# Patient Record
Sex: Female | Born: 1955 | Race: Black or African American | Hispanic: No | Smoking: Former smoker
Health system: Southern US, Community
[De-identification: ages and names within clinical notes are randomized; demographics above are authoritative.]

## PROBLEM LIST (undated history)

## (undated) DIAGNOSIS — F329 Major depressive disorder, single episode, unspecified: Secondary | ICD-10-CM

## (undated) DIAGNOSIS — E785 Hyperlipidemia, unspecified: Secondary | ICD-10-CM

## (undated) DIAGNOSIS — I10 Essential (primary) hypertension: Secondary | ICD-10-CM

## (undated) DIAGNOSIS — M1990 Unspecified osteoarthritis, unspecified site: Secondary | ICD-10-CM

## (undated) DIAGNOSIS — S83249A Other tear of medial meniscus, current injury, unspecified knee, initial encounter: Secondary | ICD-10-CM

## (undated) DIAGNOSIS — F32A Depression, unspecified: Secondary | ICD-10-CM

## (undated) HISTORY — DX: Hyperlipidemia, unspecified: E78.5

## (undated) HISTORY — PX: ABDOMINAL HYSTERECTOMY: SHX81

## (undated) HISTORY — PX: BREAST BIOPSY: SHX20

---

## 2012-05-03 HISTORY — PX: BREAST BIOPSY: SHX20

## 2015-12-18 ENCOUNTER — Encounter: Payer: Self-pay | Admitting: Emergency Medicine

## 2015-12-18 ENCOUNTER — Emergency Department
Admission: EM | Admit: 2015-12-18 | Discharge: 2015-12-18 | Disposition: A | Discharge status: Home / Self Care | Payer: Self-pay | Attending: Emergency Medicine | Admitting: Emergency Medicine

## 2015-12-18 DIAGNOSIS — I10 Essential (primary) hypertension: Secondary | ICD-10-CM | POA: Insufficient documentation

## 2015-12-18 DIAGNOSIS — F17200 Nicotine dependence, unspecified, uncomplicated: Secondary | ICD-10-CM | POA: Insufficient documentation

## 2015-12-18 DIAGNOSIS — N390 Urinary tract infection, site not specified: Secondary | ICD-10-CM | POA: Insufficient documentation

## 2015-12-18 HISTORY — DX: Essential (primary) hypertension: I10

## 2015-12-18 HISTORY — DX: Depression, unspecified: F32.A

## 2015-12-18 HISTORY — DX: Major depressive disorder, single episode, unspecified: F32.9

## 2015-12-18 LAB — URINALYSIS COMPLETE WITH MICROSCOPIC (ARMC ONLY)
Bilirubin Urine: NEGATIVE
Glucose, UA: NEGATIVE mg/dL
Ketones, ur: NEGATIVE mg/dL
Nitrite: NEGATIVE
Protein, ur: NEGATIVE mg/dL
Specific Gravity, Urine: 1.012 (ref 1.005–1.030)
pH: 5 (ref 5.0–8.0)

## 2015-12-18 MED ORDER — PHENAZOPYRIDINE HCL 100 MG PO TABS: 100.0000 mg | ORAL_TABLET | Freq: Three times a day (TID) | ORAL | 0 refills | Status: DC | PRN

## 2015-12-18 MED ORDER — CEPHALEXIN 500 MG PO CAPS: 500.0000 mg | ORAL_CAPSULE | Freq: Three times a day (TID) | ORAL | 0 refills | Status: DC

## 2015-12-18 NOTE — ED Triage Notes (Signed)
Patient presents to the ED with dysuria and hematuria.  Patient reports bladder prolapse and history of frequent UTIs and reports hematuria today.  Patient is in no obvious distress at this time.  Smiling and laughing during triage.  Ambulatory to triage without obvious difficulty.

## 2015-12-18 NOTE — ED Provider Notes (Signed)
[facility] Emergency Department Provider Note  Time seen: 7:52 AM  I have reviewed the triage vital signs and the nursing notes.   HISTORY  Chief Complaint Hematuria    HPI Elaine Robinson is a 60 y.o. female with a past medical history of depression, hypertension, who presents the emergency department with dysuria and hematuria. According to the patient she has a history of bladder prolapse and frequent UTIs. She states since yesterday she has been noticing dysuria and then a dark color to her urine which she thinks could be blood in it this morning. Patient denies fever, nausea, vomiting, diarrhea. Denies abdominal pain but states she can feel a pressure sensation in her bladder. Describes the dysuria as moderate.  Past Medical History:  Diagnosis Date  . Depression   . Hypertension     There are no active problems to display for this patient.   Past Surgical History:  Procedure Laterality Date  . ABDOMINAL HYSTERECTOMY      Prior to Admission medications   Not on File    Allergies  Allergen Reactions  . Lisinopril     No family history on file.  Social History Social History  Substance Use Topics  . Smoking status: Current Some Day Smoker  . Smokeless tobacco: Never Used  . Alcohol use Yes    Review of Systems Constitutional: Negative for fever Cardiovascular: Negative for chest pain. Respiratory: Negative for shortness of breath. Gastrointestinal: Lower abdominal pressure. Negative for nausea, vomiting, diarrhea Genitourinary: Positive for dysuria and Hematuria. Musculoskeletal: Negative for back pain. Neurological: Negative for headache 10-point ROS otherwise negative.  ____________________________________________   PHYSICAL EXAM:  VITAL SIGNS: ED Triage Vitals  Enc Vitals Group     BP 12/18/15 0729 135/78     Pulse Rate 12/18/15 0729 88     Resp 12/18/15 0729 18     Temp 12/18/15 0729 98 F (36.7 C)     Temp Source  12/18/15 0729 Oral     SpO2 12/18/15 0729 99 %     Weight 12/18/15 0730 191 lb (86.6 kg)     Height 12/18/15 0730 5\' 6"  (1.676 m)     Head Circumference --      Peak Flow --      Pain Score 12/18/15 0729 5     Pain Loc --      Pain Edu? --      Excl. in [place]? --     Constitutional: Alert and oriented. Well appearing and in no distress. Eyes: Normal exam ENT   Head: Normocephalic and atraumatic   Mouth/Throat: Mucous membranes are moist. Cardiovascular: Normal rate, regular rhythm. No murmur Respiratory: Normal respiratory effort without tachypnea nor retractions. Breath sounds are clear Gastrointestinal: Soft and nontender. No distention.  There is no CVA tenderness Musculoskeletal: Nontender with normal range of motion in all extremities.  Neurologic:  Normal speech and language. No gross focal neurologic deficits Psychiatric: Mood and affect are normal. Speech and behavior are normal.   ____________________________________________    INITIAL IMPRESSION / ASSESSMENT AND PLAN / ED COURSE  Pertinent labs & imaging results that were available during my care of the patient were reviewed by me and considered in my medical decision making (see chart for details).  The patient presents the emergency department with dysuria, lower abdominal pressure. Patient states a history of frequent urinary tract infections, she does not have a urologist in this area as she recently moved here 1 month ago. We will check  a urinalysis as well as a urine culture. Overall the patient appears well, no distress with a nontender abdominal exam without CVA tenderness.  The patient has 6-30 RBCs and WBCs given her dysuria with urinalysis findings we will treat with Keflex for presumed urinary tract infection. A urine culture has been sent. We will refer to urology for the patient's intermittent bladder prolapse she has been experiencing, as she recently moved to the area and does not have a local  urologist.  ____________________________________________   FINAL CLINICAL IMPRESSION(S) / ED DIAGNOSES  Urinary tract infection    Harvest Dark, MD 12/18/15 [phone]

## 2015-12-18 NOTE — Discharge Instructions (Signed)
Please call the number provided for urology to arrange the next available appointment, to further discuss treatment options for bladder prolapse in your frequent urinary tract infections. Return to the emergency department for any worsening discomfort, fever, or any other symptom personally concerning to your self.

## 2015-12-20 LAB — URINE CULTURE: Culture: >=100000 — AB

## 2016-01-09 ENCOUNTER — Ambulatory Visit (INDEPENDENT_AMBULATORY_CARE_PROVIDER_SITE_OTHER): Payer: Self-pay | Admitting: Urology

## 2016-01-09 VITALS — BP 133/83 | HR 92 | Ht 66.0 in | Wt 195.0 lb

## 2016-01-09 DIAGNOSIS — R300 Dysuria: Secondary | ICD-10-CM

## 2016-01-09 DIAGNOSIS — R3129 Other microscopic hematuria: Secondary | ICD-10-CM

## 2016-01-09 DIAGNOSIS — N3946 Mixed incontinence: Secondary | ICD-10-CM

## 2016-01-09 LAB — MICROSCOPIC EXAMINATION
Bacteria, UA: NONE SEEN
Epithelial Cells (non renal): >10 /hpf — AB (ref 0–10)

## 2016-01-09 LAB — URINALYSIS, COMPLETE
Bilirubin, UA: NEGATIVE
Glucose, UA: NEGATIVE
Ketones, UA: NEGATIVE
Leukocytes, UA: NEGATIVE
Nitrite, UA: NEGATIVE
Protein, UA: NEGATIVE
RBC, UA: NEGATIVE
Specific Gravity, UA: 1.025 (ref 1.005–1.030)
Urobilinogen, Ur: >8 mg/dL — ABNORMAL HIGH (ref 0.2–1.0)
pH, UA: 6.5 (ref 5.0–7.5)

## 2016-01-09 LAB — BLADDER SCAN AMB NON-IMAGING: Scan Result: 32

## 2016-01-09 NOTE — Progress Notes (Signed)
01/09/2016 9:05 AM   Elaine Robinson 24-Nov-1955 [id number]  Referring provider: No referring provider defined for this encounter.  Chief Complaint  Patient presents with  . New Patient (Initial Visit)    f/u ER dysuria, UTI, and possible prolapse    HPI   The patient was recently treated for urinary tract infection in the emergency room. She reports typical cystitis symptoms that usually clear with antibiotics. She was given Keflex. She did have a positive culture. She believes she has infections almost monthly  She was told she has prolapse. She does not have vaginal bulging sensation. When she leans forward she feels a little bit of pulling.  At baseline she gets up once or twice a night to void. She voids every 3 hours or longer. She wears 2 pads a day that are damp. She does have mild urge incontinence. She leaks with coughing and sneezing but not bending and lifting.  She has loose bowel movements and has had a hysterectomy. She has no neurologic issues  The medical records were reviewed and she did have a positive urine culture  Modifying factors: There are no other modifying factors  Associated signs and symptoms: There are no other associated signs and symptoms Aggravating and relieving factors: There are no other aggravating or relieving factors Severity: Moderate Duration: Persistent  PMH: Past Medical History:  Diagnosis Date  . Depression   . Hypertension     Surgical History: Past Surgical History:  Procedure Laterality Date  . ABDOMINAL HYSTERECTOMY      Home Medications:    Medication List    as of 01/09/2016  9:05 AM   You have not been prescribed any medications.     Allergies:  Allergies  Allergen Reactions  . Lisinopril     Family History: No family history on file.  Social History:  reports that she has been smoking.  She has never used smokeless tobacco. She reports that she drinks alcohol. Her drug history is not on file.  ROS:                                        Physical Exam: BP 133/83   Pulse 92   Ht 5\' 6"  (1.676 m)   Wt 195 lb (88.5 kg)   BMI 31.47 kg/m   Constitutional:  Alert and oriented, No acute distress. HEENT: [place] AT, moist mucus membranes.  Trachea midline, no masses. Cardiovascular: No clubbing, cyanosis, or edema. Respiratory: Normal respiratory effort, no increased work of breathing. GI: Abdomen is soft, nontender, nondistended, no abdominal masses GU: No CVA tenderness. High grade 1 cystocele and no rectocele or stress incontinence here at her bladder neck demonstrated a grade 1 type her mobility Skin: No rashes, bruises or suspicious lesions. Lymph: No cervical or inguinal adenopathy. Neurologic: Grossly intact, no focal deficits, moving all 4 extremities. Psychiatric: Normal mood and affect.  Laboratory Data: No results found for: WBC, HGB, HCT, MCV, PLT  No results found for: CREATININE  No results found for: PSA  No results found for: TESTOSTERONE  No results found for: HGBA1C  Urinalysis    Component Value Date/Time   COLORURINE YELLOW (A) 12/18/2015 0743   APPEARANCEUR HAZY (A) 12/18/2015 0743   LABSPEC 1.012 12/18/2015 0743   PHURINE 5.0 12/18/2015 0743   GLUCOSEU NEGATIVE 12/18/2015 0743   HGBUR 2+ (A) 12/18/2015 [place] 12/18/2015 [id number]  KETONESUR NEGATIVE 12/18/2015 0743   PROTEINUR NEGATIVE 12/18/2015 0743   NITRITE NEGATIVE 12/18/2015 0743   LEUKOCYTESUR TRACE (A) 12/18/2015 0743    Pertinent Imaging: None  Assessment & Plan:  The patient provides a history of chronic cystitis. She was told she has vaginal prolapse. She does not report vaginal bulging sensation. She has mild nocturia and mild mixed incontinence.  The patient does not have clinically significant prolapse. I recommended a renal ultrasound to evaluate her chronic cystitis. If one can control her chronic cystitis with urinary prophylaxis it may down  regulate her mild incontinence. The treatment of her incontinence will depend upon her treatment goals.  1. Dysuria 2. Chronic cystitis  3. Mixed incontinence   - Urinalysis, Complete - CULTURE, URINE COMPREHENSIVE    Reece Packer, MD  [facility] [address], [place],  [address] [phone]

## 2016-01-13 LAB — CULTURE, URINE COMPREHENSIVE

## 2016-01-16 ENCOUNTER — Ambulatory Visit
Admission: RE | Admit: 2016-01-16 | Discharge: 2016-01-16 | Disposition: A | Discharge status: Home / Self Care | Payer: Self-pay | Source: Ambulatory Visit | Attending: Urology | Admitting: Urology

## 2016-01-16 DIAGNOSIS — N3946 Mixed incontinence: Secondary | ICD-10-CM | POA: Insufficient documentation

## 2016-01-16 DIAGNOSIS — R3129 Other microscopic hematuria: Secondary | ICD-10-CM | POA: Insufficient documentation

## 2016-01-16 DIAGNOSIS — R300 Dysuria: Secondary | ICD-10-CM | POA: Insufficient documentation

## 2016-01-19 ENCOUNTER — Telehealth: Payer: Self-pay

## 2016-01-19 NOTE — Telephone Encounter (Signed)
Pt called requesting RUS results. Made pt aware RUS results were negative. Pt had several in depth questions in reference to the "pulling sensation" of groin. Reinforced with pt to keep f/u appt with Dr. Matilde Sprang as he will be able to answer the questions. Pt voiced understanding.

## 2016-02-24 ENCOUNTER — Encounter (INDEPENDENT_AMBULATORY_CARE_PROVIDER_SITE_OTHER): Payer: Self-pay

## 2016-02-24 ENCOUNTER — Ambulatory Visit: Payer: Self-pay | Admitting: Pharmacy Technician

## 2016-02-24 DIAGNOSIS — Z79899 Other long term (current) drug therapy: Secondary | ICD-10-CM

## 2016-02-24 NOTE — Progress Notes (Signed)
  Completed [facility] application and contract.  Patient agreed to all terms of the [facility] contract.  Patient to provide utility bill, last 30 days of pay stubs, and last 30 days of checking account statements.  Provided patient with community resource material based on her particular needs.    [facility]

## 2016-03-08 ENCOUNTER — Ambulatory Visit (INDEPENDENT_AMBULATORY_CARE_PROVIDER_SITE_OTHER): Payer: Self-pay | Admitting: Urology

## 2016-03-08 VITALS — BP 136/83 | HR 98 | Ht 66.0 in | Wt 202.0 lb

## 2016-03-08 DIAGNOSIS — N393 Stress incontinence (female) (male): Secondary | ICD-10-CM

## 2016-03-08 MED ORDER — TRIMETHOPRIM 100 MG PO TABS: 100.0000 mg | ORAL_TABLET | Freq: Every day | ORAL | 22 refills | Status: DC

## 2016-03-08 NOTE — Progress Notes (Signed)
03/08/2016 8:48 AM   Elaine Robinson August 13, 1955 [id number]  Referring provider: No referring provider defined for this encounter.  Chief Complaint  Patient presents with  . Results    HPI: The patient was recently treated for urinary tract infection in the emergency room. She reports typical cystitis symptoms that usually clear with antibiotics. She was given Keflex. She did have a positive culture. She believes she has infections almost monthly  She was told she has prolapse. She does not have vaginal bulging sensation. When she leans forward she feels a little bit of pulling.  At baseline she gets up once or twice a night to void. She voids every 3 hours or longer. She wears 2 pads a day that are damp. She does have mild urge incontinence. She leaks with coughing and sneezing but not bending and lifting.   High grade 1 cystocele and no rectocele or stress incontinence here at her bladder neck demonstrated a grade 1 type her mobility  The patient does not have clinically significant prolapse. I recommended a renal ultrasound to evaluate her chronic cystitis. If one can control her chronic cystitis with urinary prophylaxis it may down regulate her mild incontinence. The treatment of her incontinence will depend upon her treatment goals.  Today Frequency is stable Ultrasound is normal Pulling sensation in the vagina is stable Clinically she was not infected today  PMH: Past Medical History:  Diagnosis Date  . Depression   . Hypertension     Surgical History: Past Surgical History:  Procedure Laterality Date  . ABDOMINAL HYSTERECTOMY      Home Medications:    Medication List       Accurate as of 03/08/16  8:48 AM. Always use your most recent med list.          amLODipine-atorvastatin 5-20 MG tablet Commonly known as:  CADUET Take 1 tablet by mouth daily.   aspirin 81 MG chewable tablet Chew by mouth daily.   omeprazole 20 MG capsule Commonly known as:   PRILOSEC Take 20 mg by mouth daily.   sertraline 100 MG tablet Commonly known as:  ZOLOFT Take 100 mg by mouth daily.       Allergies:  Allergies  Allergen Reactions  . Lisinopril     Family History: No family history on file.  Social History:  reports that she has been smoking.  She has never used smokeless tobacco. She reports that she drinks alcohol. Her drug history is not on file.  ROS: UROLOGY Frequent Urination?: No Hard to postpone urination?: No Burning/pain with urination?: No Get up at night to urinate?: Yes Leakage of urine?: Yes Urine stream starts and stops?: No Trouble starting stream?: No Do you have to strain to urinate?: No Blood in urine?: Yes Urinary tract infection?: Yes Sexually transmitted disease?: No Injury to kidneys or bladder?: No Painful intercourse?: No Weak stream?: No Currently pregnant?: No Vaginal bleeding?: Yes Last menstrual period?: n  Gastrointestinal Nausea?: No Vomiting?: No Indigestion/heartburn?: Yes Diarrhea?: No Constipation?: Yes  Constitutional Fever: No Night sweats?: Yes Weight loss?: No Fatigue?: No  Skin Skin rash/lesions?: No Itching?: No  Eyes Blurred vision?: Yes Double vision?: No  Ears/Nose/Throat Sore throat?: Yes Sinus problems?: No  Hematologic/Lymphatic Swollen glands?: No Easy bruising?: No  Cardiovascular Leg swelling?: No Chest pain?: Yes  Respiratory Cough?: Yes Shortness of breath?: No  Endocrine Excessive thirst?: No  Musculoskeletal Back pain?: No Joint pain?: Yes  Neurological Headaches?: No Dizziness?: No  Psychologic Depression?: Yes Anxiety?:  Yes  Physical Exam: BP 136/83   Pulse 98   Ht 5\' 6"  (1.676 m)   Wt 202 lb (91.6 kg)   BMI 32.60 kg/m     Laboratory Data:  Urinalysis    Component Value Date/Time   COLORURINE YELLOW (A) 12/18/2015 0743   APPEARANCEUR Cloudy (A) 01/09/2016 0838   LABSPEC 1.012 12/18/2015 0743   PHURINE 5.0 12/18/2015  0743   GLUCOSEU Negative 01/09/2016 0838   HGBUR 2+ (A) 12/18/2015 0743   BILIRUBINUR Negative 01/09/2016 [place] 12/18/2015 0743   PROTEINUR Negative 01/09/2016 0838   PROTEINUR NEGATIVE 12/18/2015 0743   NITRITE Negative 01/09/2016 0838   NITRITE NEGATIVE 12/18/2015 0743   LEUKOCYTESUR Negative 01/09/2016 0838    Pertinent Imaging: Ultrasound normal  Assessment & Plan:  The patient was started on trimethoprim and 100 mg with 30 tablets and 11 refills sent. I will reassess in 8 weeks including her voiding dysfunction and mild pulling sensation  There are no diagnoses linked to this encounter.  No Follow-up on file.  Reece Packer, MD red  [facility] [address], [place], [place] [address] [phone]

## 2016-03-09 MED ORDER — TRIMETHOPRIM 100 MG PO TABS: 100.0000 mg | ORAL_TABLET | Freq: Every day | ORAL | 22 refills | Status: DC

## 2016-03-09 NOTE — Addendum Note (Signed)
Addended by: Wilson Singer on: 03/09/2016 10:52 AM   Modules accepted: Orders

## 2016-03-09 NOTE — Addendum Note (Signed)
Addended by: Wilson Singer on: 03/09/2016 10:24 AM   Modules accepted: Orders

## 2016-03-10 ENCOUNTER — Telehealth: Payer: Self-pay | Admitting: Urology

## 2016-03-10 ENCOUNTER — Other Ambulatory Visit: Payer: Self-pay

## 2016-03-10 DIAGNOSIS — N393 Stress incontinence (female) (male): Secondary | ICD-10-CM

## 2016-03-10 MED ORDER — TRIMETHOPRIM 100 MG PO TABS: 100.0000 mg | ORAL_TABLET | Freq: Every day | ORAL | 12 refills | Status: DC

## 2016-03-10 NOTE — Telephone Encounter (Signed)
Pt called stating that she was seen in clinic on Monday and the Dr prescribed her antibiotics.  However the Rx was sent to the wrong pharmacy.  Pt states she called yesterday and spoke to someone giving them the phone number to the correct [place] [facility] [phone]. Please advise.

## 2016-03-10 NOTE — Telephone Encounter (Signed)
Medication resent

## 2016-03-23 ENCOUNTER — Ambulatory Visit: Payer: Self-pay

## 2016-05-10 ENCOUNTER — Ambulatory Visit: Payer: Self-pay

## 2016-05-19 ENCOUNTER — Ambulatory Visit (INDEPENDENT_AMBULATORY_CARE_PROVIDER_SITE_OTHER): Payer: Self-pay | Admitting: Urology

## 2016-05-19 VITALS — BP 133/80 | HR 90 | Ht 66.0 in | Wt 205.7 lb

## 2016-05-19 DIAGNOSIS — N3020 Other chronic cystitis without hematuria: Secondary | ICD-10-CM

## 2016-05-19 NOTE — Progress Notes (Signed)
05/19/2016 8:50 AM   Elaine Robinson [birth year] [id number]  Referring provider: No referring provider defined for this encounter.  Chief Complaint  Patient presents with  . Follow-up    stress incontinence, knot on the labia x 3wks     HPI:  The patient was recently treated for urinary tract infection in the emergency room. She reports typical cystitis symptoms that usually clear with antibiotics. She was given Keflex. She did have a positive culture. She believes she has infections almost monthly  She was told she has prolapse. She does not have vaginal bulging sensation. When she leans forward she feels a little bit of pulling.  At baseline she gets up once or twice a night to void. She voids every 3 hours or longer. She wears 2 pads a day that are damp. She does have mild urge incontinence. She leaks with coughing and sneezing but not bending and lifting.   High grade 1 cystocele and no rectocele or stress incontinence here at her bladder neck demonstrated a grade 1 type her mobility  The patient does not have clinically significant prolapse. I recommended a renal ultrasound to evaluate her chronic cystitis. If one can control her chronic cystitis with urinary prophylaxis it may down regulate her mild incontinence. The treatment of her incontinence will depend upon her treatment goals.  Today I'll incontinence possibly minimally better. Frequency is stable. Clinically no infections.  At her request I examined her and she had in the suprapubic area a small inflamed hair follicle and I recommend a watchful waiting  PMH: Past Medical History:  Diagnosis Date  . Depression   . Hypertension     Surgical History: Past Surgical History:  Procedure Laterality Date  . ABDOMINAL HYSTERECTOMY      Home Medications:  Allergies as of 05/19/2016      Reactions   Lisinopril       Medication List       Accurate as of 05/19/16  8:50 AM. Always use your most recent med list.            amLODipine-atorvastatin 5-20 MG tablet Commonly known as:  CADUET Take 1 tablet by mouth daily.   aspirin 81 MG chewable tablet Chew by mouth daily.   omeprazole 20 MG capsule Commonly known as:  PRILOSEC Take 20 mg by mouth daily.   sertraline 100 MG tablet Commonly known as:  ZOLOFT Take 100 mg by mouth daily.   trimethoprim 100 MG tablet Commonly known as:  TRIMPEX Take 1 tablet (100 mg total) by mouth daily.       Allergies:  Allergies  Allergen Reactions  . Lisinopril     Family History: No family history on file.  Social History:  reports that she has been smoking.  She has never used smokeless tobacco. She reports that she drinks alcohol. Her drug history is not on file.  ROS: UROLOGY Frequent Urination?: No Hard to postpone urination?: No Burning/pain with urination?: No Get up at night to urinate?: Yes Leakage of urine?: Yes Urine stream starts and stops?: No Trouble starting stream?: No Do you have to strain to urinate?: No Blood in urine?: No Urinary tract infection?: No Sexually transmitted disease?: No Injury to kidneys or bladder?: No Painful intercourse?: No Weak stream?: No Currently pregnant?: No Vaginal bleeding?: No Last menstrual period?: n  Gastrointestinal Nausea?: No Vomiting?: No Indigestion/heartburn?: Yes Diarrhea?: No Constipation?: No  Constitutional Fever: No Night sweats?: No Weight loss?: No Fatigue?: No  Skin Skin  rash/lesions?: No Itching?: No  Eyes Blurred vision?: Yes Double vision?: No  Ears/Nose/Throat Sore throat?: No Sinus problems?: No  Hematologic/Lymphatic Swollen glands?: No Easy bruising?: No  Cardiovascular Leg swelling?: No Chest pain?: No  Respiratory Cough?: Yes Shortness of breath?: No  Endocrine Excessive thirst?: No  Musculoskeletal Back pain?: Yes Joint pain?: No  Neurological Headaches?: Yes Dizziness?: No  Psychologic Depression?: Yes Anxiety?:  Yes  Physical Exam: BP 133/80   Pulse 90   Ht 5\' 6"  (1.676 m)   Wt 205 lb 11.2 oz (93.3 kg)   BMI 33.20 kg/m     Laboratory Data: No results found for: WBC, HGB, HCT, MCV, PLT  No results found for: CREATININE  No results found for: PSA  No results found for: TESTOSTERONE  No results found for: HGBA1C  Urinalysis    Component Value Date/Time   COLORURINE YELLOW (A) 12/18/2015 0743   APPEARANCEUR Cloudy (A) 01/09/2016 0838   LABSPEC 1.012 12/18/2015 0743   PHURINE 5.0 12/18/2015 0743   GLUCOSEU Negative 01/09/2016 0838   HGBUR 2+ (A) 12/18/2015 0743   BILIRUBINUR Negative 01/09/2016 0838   KETONESUR NEGATIVE 12/18/2015 0743   PROTEINUR Negative 01/09/2016 0838   PROTEINUR NEGATIVE 12/18/2015 0743   NITRITE Negative 01/09/2016 0838   NITRITE NEGATIVE 12/18/2015 0743   LEUKOCYTESUR Negative 01/09/2016 0838    Pertinent Imaging: Renal us/ound was normal  Assessment & Plan:  The patient has chronic cystitis with mild incontinence. Renal ultrasound was normal. I will reassess in 6 months on daily trimethoprim  There are no diagnoses linked to this encounter.  Return in about 6 months (around 11/16/2016).  Reece Packer, MD  [facility] [address] [place], [place] [address] [phone]

## 2016-11-22 ENCOUNTER — Ambulatory Visit: Payer: Self-pay | Admitting: Urology

## 2016-11-22 ENCOUNTER — Encounter: Payer: Self-pay | Admitting: Urology

## 2017-03-10 ENCOUNTER — Telehealth: Payer: Self-pay | Admitting: Pharmacy Technician

## 2017-03-10 NOTE — Telephone Encounter (Signed)
Patient failed to provide current poi for 2018.  No additional medication assistance will be provided by [facility] without the required proof of income documentation.  Patient notified by letter.  [place] [facility] [facility]

## 2018-12-08 ENCOUNTER — Emergency Department
Admission: EM | Admit: 2018-12-08 | Discharge: 2018-12-08 | Disposition: A | Discharge status: Home / Self Care | Payer: Self-pay | Attending: Emergency Medicine | Admitting: Emergency Medicine

## 2018-12-08 ENCOUNTER — Other Ambulatory Visit: Payer: Self-pay

## 2018-12-08 DIAGNOSIS — F17210 Nicotine dependence, cigarettes, uncomplicated: Secondary | ICD-10-CM | POA: Insufficient documentation

## 2018-12-08 DIAGNOSIS — Z79899 Other long term (current) drug therapy: Secondary | ICD-10-CM | POA: Insufficient documentation

## 2018-12-08 DIAGNOSIS — Z7982 Long term (current) use of aspirin: Secondary | ICD-10-CM | POA: Insufficient documentation

## 2018-12-08 DIAGNOSIS — Z760 Encounter for issue of repeat prescription: Secondary | ICD-10-CM | POA: Insufficient documentation

## 2018-12-08 DIAGNOSIS — I10 Essential (primary) hypertension: Secondary | ICD-10-CM | POA: Insufficient documentation

## 2018-12-08 MED ORDER — AMLODIPINE-ATORVASTATIN 5-20 MG PO TABS: 1.0000 | ORAL_TABLET | Freq: Every day | ORAL | 1 refills | Status: DC

## 2018-12-08 MED ORDER — SIMVASTATIN 10 MG PO TABS: 10.0000 mg | ORAL_TABLET | Freq: Every evening | ORAL | 1 refills | Status: DC

## 2018-12-08 NOTE — Discharge Instructions (Signed)
Call all of the clinics listed on your discharge papers to see if they are taking new patients.  Also the open-door clinic is available to you.  Information was given to you about [facility] if needed for anxiety and/or depression.

## 2018-12-08 NOTE — ED Provider Notes (Signed)
[facility] Emergency Department Provider Note  ____________________________________________   First MD Initiated Contact with Patient 12/08/18 1033     (approximate)  I have reviewed the triage vital signs and the nursing notes.   HISTORY  Chief Complaint Medication Refill   HPI Elaine Robinson is a 63 y.o. female presents to the ED for refill of her medications.  Currently she is taking simvastatin and amlodipine.  She states that she moved here in January and has been unable to establish a PCP.  She also wanted to speak with someone about starting back her Zoloft which she stopped several years ago because she felt she did not need it.  She denies any suicidal ideation.      Past Medical History:  Diagnosis Date  . Depression   . Hypertension     There are no active problems to display for this patient.   Past Surgical History:  Procedure Laterality Date  . ABDOMINAL HYSTERECTOMY      Prior to Admission medications   Medication Sig Start Date End Date Taking? Authorizing Provider  amLODipine-atorvastatin (CADUET) 5-20 MG tablet Take 1 tablet by mouth daily. 12/08/18   Johnn Hai, PA-C  aspirin 81 MG chewable tablet Chew by mouth daily.    [provider]  omeprazole (PRILOSEC) 20 MG capsule Take 20 mg by mouth daily.    [provider]  sertraline (ZOLOFT) 100 MG tablet Take 100 mg by mouth daily.    [provider]  simvastatin (ZOCOR) 10 MG tablet Take 1 tablet (10 mg total) by mouth every evening. 12/08/18 12/08/19  Johnn Hai, PA-C  trimethoprim (TRIMPEX) 100 MG tablet Take 1 tablet (100 mg total) by mouth daily. 03/10/16   Hollice Espy, MD    Allergies Lisinopril  No family history on file.  Social History Social History   Tobacco Use  . Smoking status: Current Some Day Smoker  . Smokeless tobacco: Never Used  Substance Use Topics  . Alcohol use: Yes  . Drug use: Not on file     Review of Systems Constitutional: No fever/chills Cardiovascular: Denies chest pain. Respiratory: Denies shortness of breath. Gastrointestinal: No abdominal pain.  No nausea, no vomiting.  Genitourinary: Negative for dysuria. Musculoskeletal: Negative for back pain. Skin: Negative for rash. Neurological: Negative for headaches, focal weakness or numbness. ____________________________________________   PHYSICAL EXAM:  VITAL SIGNS: ED Triage Vitals [12/08/18 1020]  Enc Vitals Group     BP 115/61     Pulse Rate 79     Resp 18     Temp 98.1 F (36.7 C)     Temp Source Oral     SpO2 100 %     Weight 200 lb (90.7 kg)     Height 5\' 6"  (1.676 m)     Head Circumference      Peak Flow      Pain Score 0     Pain Loc      Pain Edu?      Excl. in [place]?    Constitutional: Alert and oriented. Well appearing and in no acute distress.  Patient is also here with her boyfriend. Eyes: Conjunctivae are normal.  Head: Atraumatic. Neck: No stridor.   Cardiovascular: Normal rate, regular rhythm. Grossly normal heart sounds.  Good peripheral circulation. Respiratory: Normal respiratory effort.  No retractions. Lungs CTAB. Gastrointestinal: Soft and nontender. No distention.  Musculoskeletal: Moves upper and lower extremities without any difficulty normal gait was noted. Neurologic:  Normal speech and language. No gross focal neurologic deficits are appreciated. No gait instability. Skin:  Skin is warm, dry and intact. No rash noted. Psychiatric: Mood and affect are normal. Speech and behavior are normal.  ____________________________________________   LABS (all labs ordered are listed, but only abnormal results are displayed)  Labs Reviewed - No data to display   PROCEDURES  Procedure(s) performed (including Critical Care):  Procedures   ____________________________________________   INITIAL IMPRESSION / ASSESSMENT AND PLAN / ED COURSE  As part of my medical decision making, I  reviewed the following data within the electronic MEDICAL RECORD NUMBER Notes from prior ED visits and [place] Controlled Substance Database  63 year old female presents to the ED for a refill of medications.  Patient states that she takes amlodipine and simvastatin but has been out of them for approximately 5 or 6 months.  She also inquired about Zoloft but has not taken it in several years.  She was given information about [facility] that she can follow-up with and be evaluated.  She denies any suicidal ideation and states that she discontinued taking it several years ago because she felt like she did not need it.  Her routine medication was refilled and patient was given a list of clinics that charge on a sliding scale along with the [facility].  ____________________________________________   FINAL CLINICAL IMPRESSION(S) / ED DIAGNOSES  Final diagnoses:  Encounter for medication refill     ED Discharge Orders         Ordered    amLODipine-atorvastatin (CADUET) 5-20 MG tablet  Daily     12/08/18 1148    simvastatin (ZOCOR) 10 MG tablet  Every evening     12/08/18 1148           Note:  This document was prepared using Dragon voice recognition software and may include unintentional dictation errors.    Johnn Hai, PA-C 12/08/18 1210    Earleen Newport, MD 12/08/18 [phone]

## 2018-12-08 NOTE — ED Notes (Signed)
See triage note  Presents requesting medication refill states she has not been able to find a PCP  Requesting clinic names on discharge

## 2018-12-08 NOTE — ED Triage Notes (Addendum)
Requesting medication refill. Pt reports recent move to area in January and has been unable to establish PCP. Pt alert and oriented X4, active, cooperative, pt in NAD. RR even and unlabored, color WNL.  Amlodipine, simvastatin and possibly sertraline but pt has stopped taking at this time.

## 2018-12-13 ENCOUNTER — Other Ambulatory Visit: Payer: Self-pay

## 2018-12-13 ENCOUNTER — Ambulatory Visit: Payer: Self-pay

## 2018-12-19 ENCOUNTER — Ambulatory Visit: Payer: Self-pay

## 2018-12-21 ENCOUNTER — Encounter: Payer: Self-pay | Admitting: Gerontology

## 2018-12-21 ENCOUNTER — Ambulatory Visit: Payer: Self-pay | Admitting: Gerontology

## 2018-12-21 ENCOUNTER — Other Ambulatory Visit: Payer: Self-pay

## 2018-12-21 VITALS — BP 115/74 | HR 97 | Ht 66.0 in | Wt 205.4 lb

## 2018-12-21 DIAGNOSIS — E785 Hyperlipidemia, unspecified: Secondary | ICD-10-CM

## 2018-12-21 DIAGNOSIS — Z8659 Personal history of other mental and behavioral disorders: Secondary | ICD-10-CM

## 2018-12-21 DIAGNOSIS — Z7689 Persons encountering health services in other specified circumstances: Secondary | ICD-10-CM

## 2018-12-21 DIAGNOSIS — H538 Other visual disturbances: Secondary | ICD-10-CM

## 2018-12-21 DIAGNOSIS — I10 Essential (primary) hypertension: Secondary | ICD-10-CM | POA: Insufficient documentation

## 2018-12-21 DIAGNOSIS — Z0000 Encounter for general adult medical examination without abnormal findings: Secondary | ICD-10-CM | POA: Insufficient documentation

## 2018-12-21 MED ORDER — AMLODIPINE BESYLATE 5 MG PO TABS: 5.0000 mg | ORAL_TABLET | Freq: Every day | ORAL | 3 refills | Status: DC

## 2018-12-21 MED ORDER — SIMVASTATIN 10 MG PO TABS: 10.0000 mg | ORAL_TABLET | Freq: Every evening | ORAL | 3 refills | Status: DC

## 2018-12-21 NOTE — Progress Notes (Signed)
Patient ID: Elaine Robinson, female   DOB: [birth year], 63 y.o.   MRN: [id number]  Chief Complaint  Patient presents with  . Establish Care    HPI Elaine Robinson is a 63 y.o. female who presents to establish care and evaluation of her chronic problems. She was evaluated at the ED on 12/08/2018 for medication refill. She endorses having a history of hypertension and hyperlipidemia. She takes 5 mg Amlodipine and 10 mg Simvastatin daily. She denies chest pain, palpitation, light headedness, peripheral edema and myalgia. She reports that she experiences blurry vision and has not had an eye exam in over 3 years. She continues to smoke 1 pack of cigarette in 4 days and admits the desire to quit. She states that her mood fluctuates and she resumed taking 50 mg Sertraline daily. She states that she has not had mammogram nor pap smear in more than 4 years. She states that she's doing well, denies suicidal or homicidal ideation, fever, chills and no further concerns.   Past Medical History:  Diagnosis Date  . Depression   . Hypertension     Past Surgical History:  Procedure Laterality Date  . ABDOMINAL HYSTERECTOMY      No family history on file.  Social History Social History   Tobacco Use  . Smoking status: Current Some Day Smoker  . Smokeless tobacco: Never Used  Substance Use Topics  . Alcohol use: Yes  . Drug use: Not on file    Allergies  Allergen Reactions  . Lisinopril     Current Outpatient Medications  Medication Sig Dispense Refill  . aspirin 81 MG chewable tablet Chew by mouth daily.    . sertraline (ZOLOFT) 100 MG tablet Take 50 mg by mouth daily.     . simvastatin (ZOCOR) 10 MG tablet Take 1 tablet (10 mg total) by mouth every evening. 30 tablet 3  . vitamin B-12 (CYANOCOBALAMIN) 100 MCG tablet Take 100 mcg by mouth daily.    Marland Kitchen amLODipine (NORVASC) 5 MG tablet Take 1 tablet (5 mg total) by mouth daily. 30 tablet 3   No current facility-administered medications for this  visit.     Review of Systems Review of Systems  Constitutional: Negative.   HENT: Negative.   Eyes: Positive for visual disturbance.  Respiratory: Negative.   Cardiovascular: Negative.   Gastrointestinal: Negative.   Endocrine: Negative.   Genitourinary: Negative.   Musculoskeletal: Negative.   Skin: Negative.   Neurological: Negative.   Psychiatric/Behavioral:       She reports that her mood fluctuates    Blood pressure 115/74, pulse 97, height 5' 6"  (1.676 m), weight 205 lb 6.4 oz (93.2 kg), SpO2 98 %.  Physical Exam Physical Exam Constitutional:      Appearance: Normal appearance.  HENT:     Head: Normocephalic and atraumatic.     Mouth/Throat:     Mouth: Mucous membranes are moist.  Eyes:     Extraocular Movements: Extraocular movements intact.     Pupils: Pupils are equal, round, and reactive to light.  Neck:     Musculoskeletal: Normal range of motion.  Cardiovascular:     Rate and Rhythm: Normal rate and regular rhythm.     Pulses: Normal pulses.     Heart sounds: Normal heart sounds.  Pulmonary:     Effort: Pulmonary effort is normal.     Breath sounds: Normal breath sounds.  Abdominal:     General: Bowel sounds are normal.     Palpations: Abdomen is  soft.  Musculoskeletal: Normal range of motion.  Skin:    General: Skin is warm and dry.  Neurological:     General: No focal deficit present.     Mental Status: She is alert and oriented to person, place, and time. Mental status is at baseline.  Psychiatric:        Mood and Affect: Mood normal.        Behavior: Behavior normal.        Thought Content: Thought content normal.        Judgment: Judgment normal.     Data Reviewed Her past medical history and lab result was reviewed.  Assessment and Plan 1. Encounter to establish care -Routine labs will be checked and she was provided [facility] information to call and schedule Mammogram and pap smear. - Urinalysis; Future - CBC w/Diff; Future - Comp Met  (CMET); Future - Lipid panel; Future - HgB A1c; Future - TSH; Future - MM Digital Screening; Future - Cytology - PAP( [place]) - B12 and Folate Panel; Future  2. Essential hypertension - She will continue on current treatment regimen and was encouraged to continue on -Low salt DASH diet -Take medications regularly on time -Exercise regularly as tolerated -Check blood pressure at least once a week at home or a nearby pharmacy, record and bring log to clinic. -Goal is less than 140/90 and normal blood pressure is 120/80 - amLODipine (NORVASC) 5 MG tablet; Take 1 tablet (5 mg total) by mouth daily.  Dispense: 30 tablet; Refill: 3  3. Elevated lipids - She will continue on current treatment regimen and was encouraged to continue on -Low fat Diet, like low fat dairy products eg skimmed milk -Avoid any fried food -Regular exercise/walk -Goal for Total Cholesterol is less than 200 -Goal for bad cholesterol LDL is less than 100 -Goal for Good cholesterol HDL is more than 45 -Goal for Triglyceride is less than 150 - simvastatin (ZOCOR) 10 MG tablet; Take 1 tablet (10 mg total) by mouth every evening.  Dispense: 30 tablet; Refill: 3  4. History of depression - She will continue on current medication and will follow up with [name] for mental health counseling.  5. Visual blurriness - Ophthalmology referral.  Follow up   On 01/30/2019 or if symptom worsens.   Leiloni Smithers E Larena Ohnemus 12/21/2018, 9:15 PM

## 2018-12-21 NOTE — Patient Instructions (Signed)

## 2018-12-27 ENCOUNTER — Other Ambulatory Visit: Payer: Self-pay

## 2018-12-27 DIAGNOSIS — Z7689 Persons encountering health services in other specified circumstances: Secondary | ICD-10-CM

## 2018-12-28 ENCOUNTER — Ambulatory Visit: Payer: Self-pay | Admitting: Licensed Clinical Social Worker

## 2018-12-28 ENCOUNTER — Encounter: Payer: Self-pay | Admitting: Licensed Clinical Social Worker

## 2018-12-28 DIAGNOSIS — F411 Generalized anxiety disorder: Secondary | ICD-10-CM

## 2018-12-28 DIAGNOSIS — F331 Major depressive disorder, recurrent, moderate: Secondary | ICD-10-CM

## 2018-12-28 LAB — CBC WITH DIFFERENTIAL/PLATELET
Basophils Absolute: 0.1 10*3/uL (ref 0.0–0.2)
Basos: 1 %
EOS (ABSOLUTE): 0.3 10*3/uL (ref 0.0–0.4)
Eos: 5 %
Hematocrit: 36.5 % (ref 34.0–46.6)
Hemoglobin: 12.1 g/dL (ref 11.1–15.9)
Immature Grans (Abs): 0 10*3/uL (ref 0.0–0.1)
Immature Granulocytes: 0 %
Lymphocytes Absolute: 1.2 10*3/uL (ref 0.7–3.1)
Lymphs: 25 %
MCH: 28.4 pg (ref 26.6–33.0)
MCHC: 33.2 g/dL (ref 31.5–35.7)
MCV: 86 fL (ref 79–97)
Monocytes Absolute: 0.4 10*3/uL (ref 0.1–0.9)
Monocytes: 8 %
Neutrophils Absolute: 2.8 10*3/uL (ref 1.4–7.0)
Neutrophils: 61 %
Platelets: 302 10*3/uL (ref 150–450)
RBC: 4.26 x10E6/uL (ref 3.77–5.28)
RDW: 13.2 % (ref 11.7–15.4)
WBC: 4.7 10*3/uL (ref 3.4–10.8)

## 2018-12-28 LAB — COMPREHENSIVE METABOLIC PANEL
ALT: 9 IU/L (ref 0–32)
AST: 15 IU/L (ref 0–40)
Albumin/Globulin Ratio: 1.6 (ref 1.2–2.2)
Albumin: 4.5 g/dL (ref 3.8–4.9)
Alkaline Phosphatase: 73 IU/L (ref 39–117)
BUN/Creatinine Ratio: 17 (ref 12–28)
BUN: 12 mg/dL (ref 8–27)
Bilirubin Total: 0.4 mg/dL (ref 0.0–1.2)
CO2: 22 mmol/L (ref 20–29)
Calcium: 9.5 mg/dL (ref 8.7–10.3)
Chloride: 104 mmol/L (ref 96–106)
Creatinine, Ser: 0.72 mg/dL (ref 0.57–1.00)
GFR calc Af Amer: 105 mL/min/{1.73_m2} (ref >59)
GFR calc non Af Amer: 91 mL/min/{1.73_m2} (ref >59)
Globulin, Total: 2.9 g/dL (ref 1.5–4.5)
Glucose: 98 mg/dL (ref 65–99)
Potassium: 4.4 mmol/L (ref 3.5–5.2)
Sodium: 142 mmol/L (ref 134–144)
Total Protein: 7.4 g/dL (ref 6.0–8.5)

## 2018-12-28 LAB — URINALYSIS
Bilirubin, UA: NEGATIVE
Glucose, UA: NEGATIVE
Ketones, UA: NEGATIVE
Nitrite, UA: NEGATIVE
Protein,UA: NEGATIVE
RBC, UA: NEGATIVE
Specific Gravity, UA: 1.016 (ref 1.005–1.030)
Urobilinogen, Ur: 1 mg/dL (ref 0.2–1.0)
pH, UA: 6.5 (ref 5.0–7.5)

## 2018-12-28 LAB — LIPID PANEL
Chol/HDL Ratio: 2.9 ratio (ref 0.0–4.4)
Cholesterol, Total: 134 mg/dL (ref 100–199)
HDL: 46 mg/dL (ref >39)
LDL Chol Calc (NIH): 74 mg/dL (ref 0–99)
Triglycerides: 69 mg/dL (ref 0–149)
VLDL Cholesterol Cal: 14 mg/dL (ref 5–40)

## 2018-12-28 LAB — B12 AND FOLATE PANEL
Folate: 9 ng/mL (ref >3.0)
Vitamin B-12: 1217 pg/mL (ref 232–1245)

## 2018-12-28 LAB — HEMOGLOBIN A1C
Est. average glucose Bld gHb Est-mCnc: 120 mg/dL
Hgb A1c MFr Bld: 5.8 % — ABNORMAL HIGH (ref 4.8–5.6)

## 2018-12-28 LAB — TSH: TSH: 1.94 u[IU]/mL (ref 0.450–4.500)
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
## 2019-01-02 ENCOUNTER — Other Ambulatory Visit: Payer: Self-pay

## 2019-01-02 MED ORDER — SERTRALINE HCL 100 MG PO TABS: 100.0000 mg | ORAL_TABLET | Freq: Every day | ORAL | 0 refills | Status: DC

## 2019-01-04 ENCOUNTER — Other Ambulatory Visit: Payer: Self-pay

## 2019-01-04 ENCOUNTER — Ambulatory Visit: Payer: Self-pay | Admitting: Licensed Clinical Social Worker

## 2019-01-04 DIAGNOSIS — F411 Generalized anxiety disorder: Secondary | ICD-10-CM

## 2019-01-04 DIAGNOSIS — F331 Major depressive disorder, recurrent, moderate: Secondary | ICD-10-CM
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
## 2019-01-09 ENCOUNTER — Ambulatory Visit: Payer: Self-pay | Attending: Oncology | Admitting: *Deleted

## 2019-01-09 ENCOUNTER — Encounter: Payer: Self-pay | Admitting: *Deleted

## 2019-01-09 ENCOUNTER — Other Ambulatory Visit: Payer: Self-pay

## 2019-01-09 VITALS — BP 120/77 | HR 66 | Temp 96.9°F | Ht 66.0 in | Wt 203.6 lb

## 2019-01-09 DIAGNOSIS — N63 Unspecified lump in unspecified breast: Secondary | ICD-10-CM

## 2019-01-09 NOTE — Progress Notes (Signed)
Subjective:     Patient ID: Zakaria Pliska, female   DOB: 11/19/1955, 63 y.o.   MRN: [id number]  HPI   Review of Systems     Objective:   Physical Exam Chest:     Breasts:        Right: Mass and tenderness present. No swelling, bleeding, inverted nipple, nipple discharge or skin change.        Left: No swelling, bleeding, inverted nipple, mass, nipple discharge or tenderness.    Abdominal:     Palpations: There is no hepatomegaly or splenomegaly.    Genitourinary:    Exam position: Lithotomy position.     Pubic Area: No rash or pubic lice.      Labia:        Right: No rash, tenderness, lesion or injury.        Left: No rash, tenderness, lesion or injury.      Urethra: No prolapse, urethral pain, urethral swelling or urethral lesion.     Cervix: No cervical motion tenderness.  Lymphadenopathy:     Upper Body:     Right upper body: No supraclavicular or axillary adenopathy.     Left upper body: No supraclavicular or axillary adenopathy.     Lower Body: No right inguinal adenopathy. No left inguinal adenopathy.        Assessment:     63 year old Black female referred to [place] by the [facility] for financial assistance for her mammogram.  Patient complains of targeted right breast pain at 12:00.  States it last only a few seconds, present a couple of times a week and it has been going on for about 1 year.  She also complains of painful urination when stopping her flow.  Denies hesitancy or frequency.  States it is intermittent, but has also been going on for about 1 year.  On clinical breast exam I can palpate a very tender <0.5 cm nodule at 12:00 right breast at the edge of the areola.  Bilateral breast have a fibroglandular like pattern.  Taught self breast awareness.   The patient has a history of hysterectomy for fibroids.  She does not know if she has her cervix.  Pelvic exam reveals no cervix, or lesions.  I thought I could palpate a mass during the bimanual exam,  but was unable to elicit the same feeling on further exam.  I had a second RN, Al Pimple, RN come in and re-examine the pelvis via bimanual exam.  She was unable to palpate a mass.  If patient continues to experience painful urination she was encouraged to follow up at the [facility] for further evaluation.  No pap collected per protocol. Risk Assessment    No risk assessment data for the current encounter   Risk Scores      01/05/2019   Last edited by: Orson Slick, CMA   5-year risk:    Lifetime risk:              Plan:     Bilateral diagnostic mammogram and ultrasound ordered for the targeted right breast pain.  Patient was encouraged to go by the [facility] to complete consent for release of information to obtain her previous mammogram images, since [facility] will not schedule a diagnostic mammogram without prior images.  Jeanella Anton notified to schedule patient as soon as her films have arrived.  Will forward notes to the [facility].  Will follow-up per [facility] protocol.

## 2019-01-15 ENCOUNTER — Other Ambulatory Visit: Payer: Self-pay

## 2019-01-15 DIAGNOSIS — Z20822 Contact with and (suspected) exposure to covid-19: Secondary | ICD-10-CM

## 2019-01-17 ENCOUNTER — Other Ambulatory Visit: Payer: Self-pay | Admitting: *Deleted

## 2019-01-17 DIAGNOSIS — N63 Unspecified lump in unspecified breast: Secondary | ICD-10-CM

## 2019-01-17 LAB — NOVEL CORONAVIRUS, NAA: SARS-CoV-2, NAA: NOT DETECTED

## 2019-01-18 ENCOUNTER — Ambulatory Visit: Payer: Self-pay | Admitting: Licensed Clinical Social Worker

## 2019-01-18 ENCOUNTER — Other Ambulatory Visit: Payer: Self-pay

## 2019-01-18 DIAGNOSIS — F331 Major depressive disorder, recurrent, moderate: Secondary | ICD-10-CM

## 2019-01-18 DIAGNOSIS — F411 Generalized anxiety disorder: Secondary | ICD-10-CM
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
## 2019-01-19 ENCOUNTER — Ambulatory Visit: Payer: Self-pay

## 2019-01-23 ENCOUNTER — Other Ambulatory Visit: Payer: Self-pay

## 2019-01-23 ENCOUNTER — Ambulatory Visit: Payer: Self-pay | Admitting: Pharmacy Technician

## 2019-01-23 ENCOUNTER — Encounter (INDEPENDENT_AMBULATORY_CARE_PROVIDER_SITE_OTHER): Payer: Self-pay

## 2019-01-23 DIAGNOSIS — Z79899 Other long term (current) drug therapy: Secondary | ICD-10-CM

## 2019-01-23 MED ORDER — SERTRALINE HCL 100 MG PO TABS: 100.0000 mg | ORAL_TABLET | Freq: Every day | ORAL | 2 refills | Status: DC

## 2019-01-24 NOTE — Progress Notes (Signed)
Completed [facility] application and contract.  Patient agreed to all terms of the [facility] contract.    Patient approved to receive medication assistance at [facility] as long as eligibility criteria continues to be met.    Provided patient with community resource material based on her particular needs.    [facility]

## 2019-01-25 ENCOUNTER — Ambulatory Visit
Admission: RE | Admit: 2019-01-25 | Discharge: 2019-01-25 | Disposition: A | Discharge status: Home / Self Care | Payer: Self-pay | Source: Ambulatory Visit | Attending: Oncology | Admitting: Oncology

## 2019-01-25 DIAGNOSIS — N63 Unspecified lump in unspecified breast: Secondary | ICD-10-CM

## 2019-01-30 ENCOUNTER — Ambulatory Visit: Payer: Self-pay | Admitting: Gerontology

## 2019-01-30 ENCOUNTER — Other Ambulatory Visit: Payer: Self-pay

## 2019-01-30 ENCOUNTER — Encounter: Payer: Self-pay | Admitting: Gerontology

## 2019-01-30 VITALS — BP 126/82 | HR 72 | Temp 97.5°F | Ht 66.0 in | Wt 202.0 lb

## 2019-01-30 DIAGNOSIS — Z8659 Personal history of other mental and behavioral disorders: Secondary | ICD-10-CM

## 2019-01-30 DIAGNOSIS — E785 Hyperlipidemia, unspecified: Secondary | ICD-10-CM

## 2019-01-30 DIAGNOSIS — R7303 Prediabetes: Secondary | ICD-10-CM | POA: Insufficient documentation

## 2019-01-30 DIAGNOSIS — I10 Essential (primary) hypertension: Secondary | ICD-10-CM

## 2019-01-30 DIAGNOSIS — Z0000 Encounter for general adult medical examination without abnormal findings: Secondary | ICD-10-CM

## 2019-01-30 NOTE — Progress Notes (Signed)
Established Patient Office Visit  Subjective:  Patient ID: Elaine Robinson, female    DOB: Sep 11, 1955  Age: 63 y.o. MRN: [id number]  CC:  Chief Complaint  Patient presents with  . Hypertension    HPI Elaine Robinson presents for follow up of hypertension, hyperlipidemia, depression and prediabetes. She's compliant with her medications and denies chest pain, palpitation , peripheral edema and myalgia. She continues to smoke 1 pack of cigarette in four days and admits the desire to quit. 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 She will follow up in December 2020 for left breast mammography, and she has not had colonoscopy done. Her HgbA1c done on 9/2/202 was 5.8%, she states that her mood is good, denies suicidal or homicidal ideation and offers no further complaint.  Past Medical History:  Diagnosis Date  . Depression   . Hypertension     Past Surgical History:  Procedure Laterality Date  . ABDOMINAL HYSTERECTOMY    . BREAST BIOPSY Right 05/03/2012   stereo bx    No family history on file.  Social History   Socioeconomic History  . Marital status: Widowed    Spouse name: Not on file  . Number of children: 1  . Years of education: 11th grade  . Highest education level: 11th grade  Occupational History  . Not on file  Social Needs  . Financial resource strain: Not very hard  . Food insecurity    Worry: Never true    Inability: Never true  . Transportation needs    Medical: No    Non-medical: No  Tobacco Use  . Smoking status: Current Some Day Smoker  . Smokeless tobacco: Never Used  Substance and Sexual Activity  . Alcohol use: Not Currently  . Drug use: Not Currently  . Sexual activity: Not Currently    Birth control/protection: None   Lifestyle  . Physical activity    Days per week: Not on file    Minutes per session: Not on file  . Stress: To some extent  Relationships  . Social connections    Talks on phone: More than three times a week    Gets together: More than three times a week    Attends religious service: More than 4 times per year    Active member of club or organization: Yes    Attends meetings of clubs or organizations: More than 4 times per year    Relationship status: Widowed  . Intimate partner violence    Fear of current or ex partner: No    Emotionally abused: No    Physically abused: No    Forced sexual activity: No  Other Topics Concern  . Not on file  Social History Narrative  . Not on file    Outpatient Medications Prior to Visit  Medication Sig Dispense Refill  . amLODipine (NORVASC) 5 MG tablet Take 1 tablet (5 mg total) by mouth daily. 30 tablet 3  . aspirin 81 MG chewable tablet Chew by mouth daily.    . sertraline (ZOLOFT) 100 MG tablet Take 1 tablet (100 mg total) by mouth daily. 30 tablet 2  . simvastatin (ZOCOR) 10 MG tablet Take 1 tablet (10 mg total) by mouth every evening. 30 tablet 3  . vitamin B-12 (CYANOCOBALAMIN) 100 MCG tablet Take 100 mcg by mouth daily.  No facility-administered medications prior to visit.     Allergies  Allergen Reactions  . Lisinopril     ROS Review of Systems  Constitutional: Negative.   Eyes: Negative.   Respiratory: Negative.   Cardiovascular: Negative.   Endocrine: Negative.   Skin: Negative.   Neurological: Negative.   Psychiatric/Behavioral: Negative.       Objective:    Physical Exam  Constitutional: She is oriented to person, place, and time. She appears well-developed and well-nourished.  HENT:  Head: Normocephalic.  Cardiovascular: Normal rate and regular rhythm.  Pulmonary/Chest: Effort normal and breath sounds normal.  Musculoskeletal: Normal range of motion.  Neurological: She is alert and oriented to person,  place, and time.  Skin: Skin is warm and dry.  Psychiatric: She has a normal mood and affect. Her behavior is normal. Judgment and thought content normal.    BP 126/82 (BP Location: Left Arm, Patient Position: Sitting)   Pulse 72   Temp (!) 97.5 F (36.4 C)   Ht 5\' 6"  (1.676 m)   Wt 202 lb (91.6 kg)   SpO2 99%   BMI 32.60 kg/m  Wt Readings from Last 3 Encounters:  01/30/19 202 lb (91.6 kg)  01/09/19 203 lb 9.6 oz (92.4 kg)  12/21/18 205 lb 6.4 oz (93.2 kg)     Health Maintenance Due  Topic Date Due  . Hepatitis C Screening  Apr 11, 1956  . HIV Screening  01/11/1973  . TETANUS/TDAP  01/11/1977  . PAP SMEAR-Modifier  01/12/1979  . COLONOSCOPY  01/12/2008    There are no preventive care reminders to display for this patient.  Lab Results  Component Value Date   TSH 1.940 12/27/2018   Lab Results  Component Value Date   WBC 4.7 12/27/2018   HGB 12.1 12/27/2018   HCT 36.5 12/27/2018   MCV 86 12/27/2018   PLT 302 12/27/2018   Lab Results  Component Value Date   NA 142 12/27/2018   K 4.4 12/27/2018   CO2 22 12/27/2018   GLUCOSE 98 12/27/2018   BUN 12 12/27/2018   CREATININE 0.72 12/27/2018   BILITOT 0.4 12/27/2018   ALKPHOS 73 12/27/2018   AST 15 12/27/2018   ALT 9 12/27/2018   PROT 7.4 12/27/2018   ALBUMIN 4.5 12/27/2018   CALCIUM 9.5 12/27/2018   Lab Results  Component Value Date   CHOL 134 12/27/2018   Lab Results  Component Value Date   HDL 46 12/27/2018   Lab Results  Component Value Date   LDLCALC 74 12/27/2018   Lab Results  Component Value Date   TRIG 69 12/27/2018   Lab Results  Component Value Date   CHOLHDL 2.9 12/27/2018   Lab Results  Component Value Date   HGBA1C 5.8 (H) 12/27/2018      Assessment & Plan:     1. Essential hypertension - Her blood pressure is controlled and she will continue on current treatment regimen. -Low salt DASH diet -Take medications regularly on time -Exercise regularly as tolerated -Check  blood pressure at least once a week at home or a nearby pharmacy and record -Goal is less than 140/90 and normal blood pressure is 120/80   2. Elevated lipids - Her Lipid panel done 1 month ago was within normal limits and she will continue on current treatment regimen. -Low fat Diet, like low fat dairy products eg skimmed milk -Avoid any fried food -Regular exercise/walk -Goal for Total Cholesterol is less than 200 -Goal for bad cholesterol LDL is  less than 100 -Goal for Good cholesterol HDL is more than 45 -Goal for Triglyceride is less than 150   3. History of depression - She will continue on current treatment regimen and follow up with [name] - She was advised to notify clinic or go to the ED for worsening symptoms.  4. Prediabetes - Her HgbA1c was 5.8 %, she declined taking Metformin, stated that she will continue on low carb/non concentrated sweet diet for 3 months. - HgB A1c; Future  5. Healthcare maintenance - She was encouraged to complete charity care application for - Ambulatory referral to Gastroenterology for Colonoscopy screening.   Follow-up: Return in about 9 weeks (around 04/03/2019), or if symptoms worsen or fail to improve.    Carolann Brazell Jerold Coombe, NP

## 2019-01-30 NOTE — Patient Instructions (Signed)
Carbohydrate Counting for Diabetes Mellitus, Adult  Carbohydrate counting is a method of keeping track of how many carbohydrates you eat. Eating carbohydrates naturally increases the amount of sugar (glucose) in the blood. Counting how many carbohydrates you eat helps keep your blood glucose within normal limits, which helps you manage your diabetes (diabetes mellitus). It is important to know how many carbohydrates you can safely have in each meal. This is different for every person. A diet and nutrition specialist (registered dietitian) can help you make a meal plan and calculate how many carbohydrates you should have at each meal and snack. Carbohydrates are found in the following foods:  Grains, such as breads and cereals.  Dried beans and soy products.  Starchy vegetables, such as potatoes, peas, and corn.  Fruit and fruit juices.  Milk and yogurt.  Sweets and snack foods, such as cake, cookies, candy, chips, and soft drinks. How do I count carbohydrates? There are two ways to count carbohydrates in food. You can use either of the methods or a combination of both. Reading "Nutrition Facts" on packaged food The "Nutrition Facts" list is included on the labels of almost all packaged foods and beverages in the [place] It includes:  The serving size.  Information about nutrients in each serving, including the grams (g) of carbohydrate per serving. To use the "Nutrition Facts":  Decide how many servings you will have.  Multiply the number of servings by the number of carbohydrates per serving.  The resulting number is the total amount of carbohydrates that you will be having. Learning standard serving sizes of other foods When you eat carbohydrate foods that are not packaged or do not include "Nutrition Facts" on the label, you need to measure the servings in order to count the amount of carbohydrates:  Measure the foods that you will eat with a food scale or measuring cup, if needed.   Decide how many standard-size servings you will eat.  Multiply the number of servings by 15. Most carbohydrate-rich foods have about 15 g of carbohydrates per serving. ? For example, if you eat 8 oz (170 g) of strawberries, you will have eaten 2 servings and 30 g of carbohydrates (2 servings x 15 g = 30 g).  For foods that have more than one food mixed, such as soups and casseroles, you must count the carbohydrates in each food that is included. The following list contains standard serving sizes of common carbohydrate-rich foods. Each of these servings has about 15 g of carbohydrates:   hamburger bun or  English muffin.   oz (15 mL) syrup.   oz (14 g) jelly.  1 slice of bread.  1 six-inch tortilla.  3 oz (85 g) cooked rice or pasta.  4 oz (113 g) cooked dried beans.  4 oz (113 g) starchy vegetable, such as peas, corn, or potatoes.  4 oz (113 g) hot cereal.  4 oz (113 g) mashed potatoes or  of a large baked potato.  4 oz (113 g) canned or frozen fruit.  4 oz (120 mL) fruit juice.  4-6 crackers.  6 chicken nuggets.  6 oz (170 g) unsweetened dry cereal.  6 oz (170 g) plain fat-free yogurt or yogurt sweetened with artificial sweeteners.  8 oz (240 mL) milk.  8 oz (170 g) fresh fruit or one small piece of fruit.  24 oz (680 g) popped popcorn. Example of carbohydrate counting Sample meal  3 oz (85 g) chicken breast.  6 oz (170 g)   brown rice.  4 oz (113 g) corn.  8 oz (240 mL) milk.  8 oz (170 g) strawberries with sugar-free whipped topping. Carbohydrate calculation 1. Identify the foods that contain carbohydrates: ? Rice. ? Corn. ? Milk. ? Strawberries. 2. Calculate how many servings you have of each food: ? 2 servings rice. ? 1 serving corn. ? 1 serving milk. ? 1 serving strawberries. 3. Multiply each number of servings by 15 g: ? 2 servings rice x 15 g = 30 g. ? 1 serving corn x 15 g = 15 g. ? 1 serving milk x 15 g = 15 g. ? 1 serving  strawberries x 15 g = 15 g. 4. Add together all of the amounts to find the total grams of carbohydrates eaten: ? 30 g + 15 g + 15 g + 15 g = 75 g of carbohydrates total. Summary  Carbohydrate counting is a method of keeping track of how many carbohydrates you eat.  Eating carbohydrates naturally increases the amount of sugar (glucose) in the blood.  Counting how many carbohydrates you eat helps keep your blood glucose within normal limits, which helps you manage your diabetes.  A diet and nutrition specialist (registered dietitian) can help you make a meal plan and calculate how many carbohydrates you should have at each meal and snack. This information is not intended to replace advice given to you by your health care provider. Make sure you discuss any questions you have with your health care provider. Document Released: 04/12/2005 Document Revised: 11/04/2016 Document Reviewed: 09/24/2015 Elsevier Patient Education  2020 Elsevier Inc. DASH Eating Plan DASH stands for "Dietary Approaches to Stop Hypertension." The DASH eating plan is a healthy eating plan that has been shown to reduce high blood pressure (hypertension). It may also reduce your risk for type 2 diabetes, heart disease, and stroke. The DASH eating plan may also help with weight loss. What are tips for following this plan?  General guidelines  Avoid eating more than 2,300 mg (milligrams) of salt (sodium) a day. If you have hypertension, you may need to reduce your sodium intake to 1,500 mg a day.  Limit alcohol intake to no more than 1 drink a day for nonpregnant women and 2 drinks a day for men. One drink equals 12 oz of beer, 5 oz of wine, or 1 oz of hard liquor.  Work with your health care provider to maintain a healthy body weight or to lose weight. Ask what an ideal weight is for you.  Get at least 30 minutes of exercise that causes your heart to beat faster (aerobic exercise) most days of the week. Activities may  include walking, swimming, or biking.  Work with your health care provider or diet and nutrition specialist (dietitian) to adjust your eating plan to your individual calorie needs. Reading food labels   Check food labels for the amount of sodium per serving. Choose foods with less than 5 percent of the Daily Value of sodium. Generally, foods with less than 300 mg of sodium per serving fit into this eating plan.  To find whole grains, look for the word "whole" as the first word in the ingredient list. Shopping  Buy products labeled as "low-sodium" or "no salt added."  Buy fresh foods. Avoid canned foods and premade or frozen meals. Cooking  Avoid adding salt when cooking. Use salt-free seasonings or herbs instead of table salt or sea salt. Check with your health care provider or pharmacist before using salt substitutes.    Do not fry foods. Cook foods using healthy methods such as baking, boiling, grilling, and broiling instead.  Cook with heart-healthy oils, such as olive, canola, soybean, or sunflower oil. Meal planning  Eat a balanced diet that includes: ? 5 or more servings of fruits and vegetables each day. At each meal, try to fill half of your plate with fruits and vegetables. ? Up to 6-8 servings of whole grains each day. ? Less than 6 oz of lean meat, poultry, or fish each day. A 3-oz serving of meat is about the same size as a deck of cards. One egg equals 1 oz. ? 2 servings of low-fat dairy each day. ? A serving of nuts, seeds, or beans 5 times each week. ? Heart-healthy fats. Healthy fats called Omega-3 fatty acids are found in foods such as flaxseeds and coldwater fish, like sardines, salmon, and mackerel.  Limit how much you eat of the following: ? Canned or prepackaged foods. ? Food that is high in trans fat, such as fried foods. ? Food that is high in saturated fat, such as fatty meat. ? Sweets, desserts, sugary drinks, and other foods with added sugar. ? Full-fat  dairy products.  Do not salt foods before eating.  Try to eat at least 2 vegetarian meals each week.  Eat more home-cooked food and less restaurant, buffet, and fast food.  When eating at a restaurant, ask that your food be prepared with less salt or no salt, if possible. What foods are recommended? The items listed may not be a complete list. Talk with your dietitian about what dietary choices are best for you. Grains Whole-grain or whole-wheat bread. Whole-grain or whole-wheat pasta. Brown rice. Oatmeal. Quinoa. Bulgur. Whole-grain and low-sodium cereals. Pita bread. Low-fat, low-sodium crackers. Whole-wheat flour tortillas. Vegetables Fresh or frozen vegetables (raw, steamed, roasted, or grilled). Low-sodium or reduced-sodium tomato and vegetable juice. Low-sodium or reduced-sodium tomato sauce and tomato paste. Low-sodium or reduced-sodium canned vegetables. Fruits All fresh, dried, or frozen fruit. Canned fruit in natural juice (without added sugar). Meat and other protein foods Skinless chicken or turkey. Ground chicken or turkey. Pork with fat trimmed off. Fish and seafood. Egg whites. Dried beans, peas, or lentils. Unsalted nuts, nut butters, and seeds. Unsalted canned beans. Lean cuts of beef with fat trimmed off. Low-sodium, lean deli meat. Dairy Low-fat (1%) or fat-free (skim) milk. Fat-free, low-fat, or reduced-fat cheeses. Nonfat, low-sodium ricotta or cottage cheese. Low-fat or nonfat yogurt. Low-fat, low-sodium cheese. Fats and oils Soft margarine without trans fats. Vegetable oil. Low-fat, reduced-fat, or light mayonnaise and salad dressings (reduced-sodium). Canola, safflower, olive, soybean, and sunflower oils. Avocado. Seasoning and other foods Herbs. Spices. Seasoning mixes without salt. Unsalted popcorn and pretzels. Fat-free sweets. What foods are not recommended? The items listed may not be a complete list. Talk with your dietitian about what dietary choices are best  for you. Grains Baked goods made with fat, such as croissants, muffins, or some breads. Dry pasta or rice meal packs. Vegetables Creamed or fried vegetables. Vegetables in a cheese sauce. Regular canned vegetables (not low-sodium or reduced-sodium). Regular canned tomato sauce and paste (not low-sodium or reduced-sodium). Regular tomato and vegetable juice (not low-sodium or reduced-sodium). Pickles. Olives. Fruits Canned fruit in a light or heavy syrup. Fried fruit. Fruit in cream or butter sauce. Meat and other protein foods Fatty cuts of meat. Ribs. Fried meat. Bacon. Sausage. Bologna and other processed lunch meats. Salami. Fatback. Hotdogs. Bratwurst. Salted nuts and seeds. Canned beans with   added salt. Canned or smoked fish. Whole eggs or egg yolks. Chicken or turkey with skin. Dairy Whole or 2% milk, cream, and half-and-half. Whole or full-fat cream cheese. Whole-fat or sweetened yogurt. Full-fat cheese. Nondairy creamers. Whipped toppings. Processed cheese and cheese spreads. Fats and oils Butter. Stick margarine. Lard. Shortening. Ghee. Bacon fat. Tropical oils, such as coconut, palm kernel, or palm oil. Seasoning and other foods Salted popcorn and pretzels. Onion salt, garlic salt, seasoned salt, table salt, and sea salt. Worcestershire sauce. Tartar sauce. Barbecue sauce. Teriyaki sauce. Soy sauce, including reduced-sodium. Steak sauce. Canned and packaged gravies. Fish sauce. Oyster sauce. Cocktail sauce. Horseradish that you find on the shelf. Ketchup. Mustard. Meat flavorings and tenderizers. Bouillon cubes. Hot sauce and Tabasco sauce. Premade or packaged marinades. Premade or packaged taco seasonings. Relishes. Regular salad dressings. Where to find more information:  National Heart, Lung, and Blood Institute: www.nhlbi.nih.gov  American Heart Association: www.heart.org Summary  The DASH eating plan is a healthy eating plan that has been shown to reduce high blood pressure  (hypertension). It may also reduce your risk for type 2 diabetes, heart disease, and stroke.  With the DASH eating plan, you should limit salt (sodium) intake to 2,300 mg a day. If you have hypertension, you may need to reduce your sodium intake to 1,500 mg a day.  When on the DASH eating plan, aim to eat more fresh fruits and vegetables, whole grains, lean proteins, low-fat dairy, and heart-healthy fats.  Work with your health care provider or diet and nutrition specialist (dietitian) to adjust your eating plan to your individual calorie needs. This information is not intended to replace advice given to you by your health care provider. Make sure you discuss any questions you have with your health care provider. Document Released: 04/01/2011 Document Revised: 03/25/2017 Document Reviewed: 04/05/2016 Elsevier Patient Education  2020 Elsevier Inc.  

## 2019-02-01 ENCOUNTER — Telehealth: Payer: Self-pay

## 2019-02-01 ENCOUNTER — Other Ambulatory Visit: Payer: Self-pay

## 2019-02-01 DIAGNOSIS — Z800 Family history of malignant neoplasm of digestive organs: Secondary | ICD-10-CM

## 2019-02-01 DIAGNOSIS — Z1211 Encounter for screening for malignant neoplasm of colon: Secondary | ICD-10-CM

## 2019-02-01 NOTE — Telephone Encounter (Signed)
Gastroenterology Pre-Procedure Review  Request Date: 02/13/19 Requesting Physician: Dr. Vicente Males  PATIENT REVIEW QUESTIONS: The patient responded to the following health history questions as indicated:    1. Are you having any GI issues? no 2. Do you have a personal history of Polyps? yes (Grandmother colon cancer) 3. Do you have a family history of Colon Cancer or Polyps? no 4. Diabetes Mellitus? no 5. Joint replacements in the past 12 months?no 6. Major health problems in the past 3 months?no 7. Any artificial heart valves, MVP, or defibrillator?no    MEDICATIONS & ALLERGIES:    Patient reports the following regarding taking any anticoagulation/antiplatelet therapy:   Plavix, Coumadin, Eliquis, Xarelto, Lovenox, Pradaxa, Brilinta, or Effient? no Aspirin? yes (81 mg daily)  Patient confirms/reports the following medications:  Current Outpatient Medications  Medication Sig Dispense Refill  . amLODipine (NORVASC) 5 MG tablet Take 1 tablet (5 mg total) by mouth daily. 30 tablet 3  . aspirin 81 MG chewable tablet Chew by mouth daily.    . sertraline (ZOLOFT) 100 MG tablet Take 1 tablet (100 mg total) by mouth daily. 30 tablet 2  . simvastatin (ZOCOR) 10 MG tablet Take 1 tablet (10 mg total) by mouth every evening. 30 tablet 3  . vitamin B-12 (CYANOCOBALAMIN) 100 MCG tablet Take 100 mcg by mouth daily.     No current facility-administered medications for this visit.     Patient confirms/reports the following allergies:  Allergies  Allergen Reactions  . Lisinopril     No orders of the defined types were placed in this encounter.   AUTHORIZATION INFORMATION Primary Insurance: 1D#: Group #:  Secondary Insurance: 1D#: Group #:  SCHEDULE INFORMATION: Date: 02/13/19  Time: Location:[facility]

## 2019-02-06 ENCOUNTER — Telehealth: Payer: Self-pay

## 2019-02-06 NOTE — Telephone Encounter (Signed)
Returned patients call.  She requested instructions for her COVID testing.  Patient has been advised to turn into the main hospital campus, and keep straight around the building and follow the signs for pre-admit testing.  Thanks [employer]

## 2019-02-08 ENCOUNTER — Ambulatory Visit: Payer: Self-pay | Admitting: Licensed Clinical Social Worker

## 2019-02-08 ENCOUNTER — Other Ambulatory Visit: Payer: Self-pay

## 2019-02-08 ENCOUNTER — Telehealth: Payer: Self-pay | Admitting: Gastroenterology

## 2019-02-08 DIAGNOSIS — F331 Major depressive disorder, recurrent, moderate: Secondary | ICD-10-CM

## 2019-02-08 DIAGNOSIS — Z1211 Encounter for screening for malignant neoplasm of colon: Secondary | ICD-10-CM

## 2019-02-08 DIAGNOSIS — F411 Generalized anxiety disorder: Secondary | ICD-10-CM

## 2019-02-08 MED ORDER — GOLYTELY 236 G PO SOLR: 4000.0000 mL | Freq: Once | ORAL | 0 refills | Status: AC

## 2019-02-08 NOTE — Telephone Encounter (Signed)
Pt left vm to reschedule her colonoscopy

## 2019-02-08 NOTE — Telephone Encounter (Signed)
Returned patients call to reschedule her colonoscopy.  Her colonoscopy has been rescheduled from 02/13/19 to Tuesday 03/20/19.  [facility] has been made aware of date change.  Referral has been updated.  New Instructions will be mailed to patient. Rx for Golytely will be sent to [facility] pharmacy in advance.  Thanks [employer]

## 2019-02-08 NOTE — BH Specialist Note (Signed)
Integrated Behavioral Health Follow Up Visit  MRN: [id number] Name: Elaine Robinson  Number of [place] Clinician visits: 3/6  Type of Service: [place] Interpretor:No. Interpretor Name and Language: not applicable.  SUBJECTIVE: Elaine Robinson is a 63 y.o. female accompanied by herself. Patient was referred by Carlyon Shadow NP for mental health. Patient reports the following symptoms/concerns: She explains that her daughter gave her an ultimatum regarding her boyfriend and she chose her daughter. She explains that her boyfriend was not making the best decisions so she decided to consent to her daughter's wishes. She notes that she has been better. She explains that her depression is occurring off and on nearly everyday. She explains that some days that she does not want to do anything. She notes that she has not drank any soda for over 45 days and drinks a lot of V 8 juice. She notes that she rescheduled her colonoscopy from October 20th to November 24th because she was not quite ready for it yet. She notes that she has been trying to move and found another apartment that she thought was income based but its not. She denies suicidal and homicidal thoughts.  Duration of problem: ; Severity of problem: mild  OBJECTIVE: Mood: Euthymic and Affect: Appropriate Risk of harm to self or others: No plan to harm self or others  LIFE CONTEXT: Family and Social: see above. School/Work: see above. Self-Care: see above. Life Changes: see above.  GOALS ADDRESSED: Patient will: 1.  Reduce symptoms of: depression  2.  Increase knowledge and/or ability of: coping skills  3.  Demonstrate ability to: Increase healthy adjustment to current life circumstances  INTERVENTIONS: Interventions utilized:  Supportive Counseling and Link to [employer] was utilized by the clinician during today's follow up session. Clinician processed with the  patient regarding how she has been doing since the last follow up session. Clinician explained to the patient that it sounds like she was unable to appease her daughter when it comes to her friend and was stuck with making a very difficult decision. Clinician encouraged the patient to be honest with her daughter when it comes to her change in her depression symptoms due to her ultimatum of not being able to see her friend. Clinician encouraged the patient to lean on her support system. Clinician suggested that the patient work on establishing a routine with a combination of her responsibilities, self care, and things for enjoyment. Clinician explained to the patient that in terms of housing that she can see if she can find a list of subsdized income based housing in [place] and send it to her in the mail.  Standardized Assessments completed: GAD-7 and PHQ 9  ASSESSMENT: Patient currently experiencing see above.  Patient may benefit from see above.  PLAN: 1. Follow up with behavioral health clinician on : two to three weeks or earlier if needed.  2. Behavioral recommendations: see above. 3. Referral(s): [facility] (In Clinic) 4. "From scale of 1-10, how likely are you to follow plan?":   Bayard Hugger, LCSW

## 2019-02-08 NOTE — Telephone Encounter (Signed)
Patient called l/m on v/m  & would like to r/s her colonoscopy scheduled on 02-13-19.

## 2019-02-09 ENCOUNTER — Other Ambulatory Visit: Admission: RE | Admit: 2019-02-09 | Payer: Self-pay | Source: Ambulatory Visit

## 2019-02-12 ENCOUNTER — Telehealth: Payer: Self-pay | Admitting: Gastroenterology

## 2019-02-12 NOTE — Telephone Encounter (Signed)
Patient called to cx appointment  her colonoscopy on 03-20-19 .She will call & r/s at a later date.

## 2019-02-12 NOTE — Telephone Encounter (Signed)
Returned patients call to notify her that I received the message to cancel her screening  colonoscopy.  She states she has other appts on this day and will need to call office to schedule at a later date.  Trish in Endo made aware of cancellation.  Thanks [employer]

## 2019-02-22 ENCOUNTER — Ambulatory Visit: Payer: Self-pay | Admitting: Licensed Clinical Social Worker

## 2019-02-22 ENCOUNTER — Other Ambulatory Visit: Payer: Self-pay

## 2019-02-22 DIAGNOSIS — F331 Major depressive disorder, recurrent, moderate: Secondary | ICD-10-CM

## 2019-02-22 DIAGNOSIS — F411 Generalized anxiety disorder: Secondary | ICD-10-CM

## 2019-02-22 NOTE — BH Specialist Note (Signed)
Integrated Behavioral Health Follow Up Visit Via Phone  MRN: [id number] Name: Elaine Robinson  Number of [place] Clinician visits: 4/6  Type of Service: [place] Interpretor:No. Interpretor Name and Language: not applicable.   SUBJECTIVE: Elaine Robinson is a 63 y.o. female accompanied by herself. Patient was referred by Carlyon Shadow NP for mental health. Patient reports the following symptoms/concerns: She reports that she has been doing okay. She notes her daughter is in the [place] with one of her cousins and a friend. She explains that she was worried about her daughter traveling to a foreign country with everything going in the world. She reports that she has been checking in on her grandchildren daily to make sure they have everything they need and get logged on for remote learning who are looked after by the oldest grandson who is 17. She notes that she has cut out bread, rice, and potatoes. She explains that she has lost a lot of weight and can tell a difference in the way her clothes are fitting. She reports that her mood has been up and down somewhat in the last few weeks. She notes that her friend has been at her house for the last few days and it ready for him to go because he has been getting on her nerves. She denies suicidal and homicidal thoughts.  Duration of problem: ; Severity of problem: mild  OBJECTIVE: Mood: Euthymic and Affect: Appropriate Risk of harm to self or others: No plan to harm self or others  LIFE CONTEXT: Family and Social: See above. School/Work: See above. Self-Care: See above. Life Changes: See above.   GOALS ADDRESSED: Patient will: 1.  Reduce symptoms of: stress 2.  Increase knowledge and/or ability of: coping skills, healthy habits and stress reduction  3.  Demonstrate ability to: Increase healthy adjustment to current life circumstances  INTERVENTIONS: Interventions  utilized:  Supportive Counseling was utilized by the clinician focusing on stress. Clinician processed with the patient regarding how she has been doing since the last follow up session. Clinician explained to the patient that it sounds like her daughter is traveling safely with more than one person and will be returning soon. Clinician explained to the patient that it sounds like despite her daughter being out of town that she has been spending quality time with her grandchildren and getting out of her apartment a little bit more often. Clinician encouraged the patient to set healthy boundaries with her friend.  Standardized Assessments completed: GAD-7 and PHQ 9  ASSESSMENT: Patient currently experiencing see above.  Patient may benefit from see above.   PLAN: 1. Follow up with behavioral health clinician on : three weeks or earlier if needed.  2. Behavioral recommendations: see above.  3. Referral(s): [facility] (In Clinic) 4. "From scale of 1-10, how likely are you to follow plan?":   Bayard Hugger, LCSW

## 2019-02-26 ENCOUNTER — Encounter: Payer: Self-pay | Admitting: *Deleted

## 2019-02-26 NOTE — Progress Notes (Signed)
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  Letter mailed from the [facility] to inform patient of her normal mammogram results.  Patient is to follow-up with annual screening in one year.  HSIS to [place].

## 2019-03-05 ENCOUNTER — Emergency Department: Payer: Self-pay

## 2019-03-05 ENCOUNTER — Emergency Department
Admission: EM | Admit: 2019-03-05 | Discharge: 2019-03-05 | Disposition: A | Discharge status: Home / Self Care | Payer: Self-pay | Attending: Emergency Medicine | Admitting: Emergency Medicine

## 2019-03-05 ENCOUNTER — Encounter: Payer: Self-pay | Admitting: Emergency Medicine

## 2019-03-05 ENCOUNTER — Other Ambulatory Visit: Payer: Self-pay

## 2019-03-05 DIAGNOSIS — M1712 Unilateral primary osteoarthritis, left knee: Secondary | ICD-10-CM

## 2019-03-05 DIAGNOSIS — F17200 Nicotine dependence, unspecified, uncomplicated: Secondary | ICD-10-CM | POA: Insufficient documentation

## 2019-03-05 DIAGNOSIS — M1711 Unilateral primary osteoarthritis, right knee: Secondary | ICD-10-CM | POA: Insufficient documentation

## 2019-03-05 DIAGNOSIS — M25562 Pain in left knee: Secondary | ICD-10-CM

## 2019-03-05 DIAGNOSIS — Z79899 Other long term (current) drug therapy: Secondary | ICD-10-CM | POA: Insufficient documentation

## 2019-03-05 DIAGNOSIS — I10 Essential (primary) hypertension: Secondary | ICD-10-CM | POA: Insufficient documentation

## 2019-03-05 MED ORDER — MELOXICAM 15 MG PO TABS: 15.0000 mg | ORAL_TABLET | Freq: Every day | ORAL | 0 refills | Status: DC

## 2019-03-05 NOTE — ED Provider Notes (Signed)
[facility] Emergency Department Provider Note  ____________________________________________  Time seen: Approximately 10:01 AM  I have reviewed the triage vital signs and the nursing notes.   HISTORY  Chief Complaint Knee Pain    HPI Elaine Robinson is a 63 y.o. female who presents the emergency department complaining of left knee pain.  Patient reports that approximately 8 days ago she started to experience worsening knee pain.  Patient denies any known injury.  Patient states that she has still been ambulatory and her knee has become more more painful.  Patient is also reporting edema to the left knee.  No erythema or warmth to palpation.  Patient denies any other injury or complaint.  No medications for his complaint prior to arrival.  Patient has a history of hypertension.         Past Medical History:  Diagnosis Date  . Depression   . Hypertension     Patient Active Problem List   Diagnosis Date Noted  . Prediabetes 01/30/2019  . Encounter to establish care 12/21/2018  . Essential hypertension 12/21/2018  . Elevated lipids 12/21/2018  . History of depression 12/21/2018  . Visual blurriness 12/21/2018    Past Surgical History:  Procedure Laterality Date  . ABDOMINAL HYSTERECTOMY    . BREAST BIOPSY Right 05/03/2012   stereo bx    Prior to Admission medications   Medication Sig Start Date End Date Taking? Authorizing Provider  amLODipine (NORVASC) 5 MG tablet Take 1 tablet (5 mg total) by mouth daily. 12/21/18   Iloabachie, Chioma E, NP  aspirin 81 MG chewable tablet Chew by mouth daily.    [provider]  meloxicam (MOBIC) 15 MG tablet Take 1 tablet (15 mg total) by mouth daily. 03/05/19   Cuthriell, Charline Bills, PA-C  sertraline (ZOLOFT) 100 MG tablet Take 1 tablet (100 mg total) by mouth daily. 01/23/19 02/22/19  Iloabachie, Chioma E, NP  simvastatin (ZOCOR) 10 MG tablet Take 1 tablet (10 mg total) by mouth every evening. 12/21/18  12/21/19  Iloabachie, Chioma E, NP  vitamin B-12 (CYANOCOBALAMIN) 100 MCG tablet Take 100 mcg by mouth daily.    [provider]    Allergies Lisinopril  No family history on file.  Social History Social History   Tobacco Use  . Smoking status: Current Some Day Smoker  . Smokeless tobacco: Never Used  Substance Use Topics  . Alcohol use: Not Currently  . Drug use: Not Currently     Review of Systems  Constitutional: No fever/chills Eyes: No visual changes. No discharge ENT: No upper respiratory complaints. Cardiovascular: no chest pain. Respiratory: no cough. No SOB. Gastrointestinal: No abdominal pain.  No nausea, no vomiting.  No diarrhea.  No constipation. Musculoskeletal: Positive for left knee pain Skin: Negative for rash, abrasions, lacerations, ecchymosis. Neurological: Negative for headaches, focal weakness or numbness. 10-point ROS otherwise negative.  ____________________________________________   PHYSICAL EXAM:  VITAL SIGNS: ED Triage Vitals  Enc Vitals Group     BP 03/05/19 0912 120/69     Pulse Rate 03/05/19 0912 78     Resp 03/05/19 0912 20     Temp 03/05/19 0912 98.4 F (36.9 C)     Temp Source 03/05/19 0912 Oral     SpO2 03/05/19 0912 99 %     Weight 03/05/19 0910 200 lb (90.7 kg)     Height 03/05/19 0910 5\' 6"  (1.676 m)     Head Circumference --      Peak Flow --  Pain Score 03/05/19 0910 7     Pain Loc --      Pain Edu? --      Excl. in [place]? --      Constitutional: Alert and oriented. Well appearing and in no acute distress. Eyes: Conjunctivae are normal. PERRL. EOMI. Head: Atraumatic. ENT:      Ears:       Nose: No congestion/rhinnorhea.      Mouth/Throat: Mucous membranes are moist.  Neck: No stridor.    Cardiovascular: Normal rate, regular rhythm. Normal S1 and S2.  Good peripheral circulation. Respiratory: Normal respiratory effort without tachypnea or retractions. Lungs CTAB. Good air entry to the bases with no  decreased or absent breath sounds. Musculoskeletal: Full range of motion to all extremities. No gross deformities appreciated.  Visualization of the left knee is edematous when compared with right.  No erythema.  No warmth to the knee upon palpation.  Patient is able to extend and flex the knee.  She is able to ambulate on the left lower extremity as well.  On palpation, no significant ballottement.  Patient denies any areas of tenderness to palpation.  No palpable abnormality about the left knee.  Varus, valgus, Lachman's and McMurray's is negative.  Dorsalis pedis pulse intact distally.  Sensation intact distally.   Neurologic:  Normal speech and language. No gross focal neurologic deficits are appreciated.  Skin:  Skin is warm, dry and intact. No rash noted. Psychiatric: Mood and affect are normal. Speech and behavior are normal. Patient exhibits appropriate insight and judgement.   ____________________________________________   LABS (all labs ordered are listed, but only abnormal results are displayed)  Labs Reviewed - No data to display ____________________________________________  EKG   ____________________________________________  RADIOLOGY I personally viewed and evaluated these images as part of my medical decision making, as well as reviewing the written report by the radiologist.  Dg Knee Complete 4 Views Left  Result Date: 03/05/2019 CLINICAL DATA:  Knee pain with edema.  No injury. EXAM: LEFT KNEE - COMPLETE 4+ VIEW COMPARISON:  None. FINDINGS: No acute fracture or dislocation. Mild medial compartment joint space narrowing with minimal osteophyte formation. No joint effusion. IMPRESSION: Mild osteoarthritis, without acute osseous abnormality. Electronically Signed   By: Abigail Miyamoto M.D.   On: 03/05/2019 10:44    ____________________________________________    PROCEDURES  Procedure(s) performed:    Procedures    Medications - No data to  display   ____________________________________________   INITIAL IMPRESSION / ASSESSMENT AND PLAN / ED COURSE  Pertinent labs & imaging results that were available during my care of the patient were reviewed by me and considered in my medical decision making (see chart for details).  Review of the [place] CSRS was performed in accordance of the [place] prior to dispensing any controlled drugs.           Patient's diagnosis is consistent with knee pain/osteoarthritis of the left knee.  Patient presented to the emergency department with nontraumatic left knee pain.  X-ray reveals mild osteoarthritis.  No joint effusion.  No concern for gout or septic joint on exam.  Patient was placed on anti-inflammatory for symptom relief.  Follow-up with primary care orthopedics as needed.. Patient is given ED precautions to return to the ED for any worsening or new symptoms.     ____________________________________________  FINAL CLINICAL IMPRESSION(S) / ED DIAGNOSES  Final diagnoses:  Acute pain of left knee  Primary osteoarthritis of left knee      NEW  MEDICATIONS STARTED DURING THIS VISIT:  ED Discharge Orders         Ordered    meloxicam (MOBIC) 15 MG tablet  Daily     03/05/19 1114              This chart was dictated using voice recognition software/Dragon. Despite best efforts to proofread, errors can occur which can change the meaning. Any change was purely unintentional.    Darletta Moll, PA-C 03/05/19 1118    Earleen Newport, MD 03/05/19 1535

## 2019-03-05 NOTE — ED Triage Notes (Signed)
Pt reports pain to her left knee for the past 7-8 days. Pt states she must have made an awkward move because she cannot pin point an injury. Pt ambulatory to triage but reports painful when she walks.

## 2019-03-05 NOTE — ED Notes (Signed)
See triage note  Presents with swelling to left knee for the past 8 days  Denies any injury

## 2019-03-13 ENCOUNTER — Telehealth: Payer: Self-pay | Admitting: Gerontology

## 2019-03-15 ENCOUNTER — Ambulatory Visit: Payer: Self-pay | Admitting: Licensed Clinical Social Worker

## 2019-03-15 ENCOUNTER — Other Ambulatory Visit: Payer: Self-pay

## 2019-03-15 DIAGNOSIS — F331 Major depressive disorder, recurrent, moderate: Secondary | ICD-10-CM

## 2019-03-15 DIAGNOSIS — F411 Generalized anxiety disorder: Secondary | ICD-10-CM

## 2019-03-15 NOTE — BH Specialist Note (Signed)
Integrated Behavioral Health Follow Up Visit Via Phone  MRN: [id number] Name: Elaine Robinson  Number of [place] Clinician visits: 5/6  Type of Service: [place] Interpretor:No. Interpretor Name and Language:   SUBJECTIVE: Elaine Robinson is a 63 y.o. female accompanied by herself. Patient was referred by Carlyon Shadow NP for mental health. Patient reports the following symptoms/concerns: She explains that things have been up and down. She notes that she stepped wrong when she was walking on her ankle and twisted it the last few weeks ago. She notes that she went to the emergency room and was told she had osteoarthritis in the left knee. She notes that she is taking the Meloxicam to help with the inflammation in her leg but does not think its helping. She notes that she is done with her ex and realized her daughter was right all along. She explains that she is glad she does not have to put up with that mess from ex anymore. She notes that she has gotten better with being less irritable a good portion of the time. She notes that she will got to her daughter's house for Thanksgiving and bring a dish. She notes that she is just spending Thanksgiving with her immediate family. She denies suicidal and homicidal thoughts.  Duration of problem:  Severity of problem: mild  OBJECTIVE: Mood: Euthymic and Affect: Appropriate Risk of harm to self or others: No plan to harm self or others  LIFE CONTEXT: Family and Social: see above.  School/Work: see above.  Self-Care: see above.  Life Changes: see above.   GOALS ADDRESSED: Patient will: 1.  Reduce symptoms of: depression  2.  Increase knowledge and/or ability of: stress reduction  3.  Demonstrate ability to: Increase healthy adjustment to current life circumstances  INTERVENTIONS: Interventions utilized:  Brief CBT was utilized by the clinician focusing on the patient's depression by  providing emotional support and encouragement to the patient. Clinician processed with the patient regarding how she has been doing since the last follow up session. Clinician discussed with the patient how she randomly twisted her ankle and the diagnosis of osteoarthritis. Clinician explained to the patient that it sounds like she could benefit from elevating her leg and icing it ever other hour. Clinician explained to the patient that it sounds like she made a healthy decision by deciding to not have her ex involved in her life anymore because he was causing more harm that good.  Standardized Assessments completed: GAD-7 and PHQ 9  ASSESSMENT: Patient currently experiencing see above.  Patient may benefit from see above.  PLAN: 1. Follow up with behavioral health clinician on : two to three weeks or earlier if needed. 2. Behavioral recommendations: see above.  3. Referral(s): [place] (In Clinic) 4. "From scale of 1-10, how likely are you to follow plan?":   Bayard Hugger, LCSW

## 2019-03-16 ENCOUNTER — Other Ambulatory Visit: Admission: RE | Admit: 2019-03-16 | Payer: Self-pay | Source: Ambulatory Visit

## 2019-03-20 ENCOUNTER — Ambulatory Visit: Admit: 2019-03-20 | Payer: Self-pay | Admitting: Gastroenterology

## 2019-03-20 SURGERY — COLONOSCOPY WITH PROPOFOL
Anesthesia: General

## 2019-03-28 ENCOUNTER — Other Ambulatory Visit: Payer: Self-pay

## 2019-03-28 DIAGNOSIS — R7303 Prediabetes: Secondary | ICD-10-CM

## 2019-03-29 LAB — HEMOGLOBIN A1C
Est. average glucose Bld gHb Est-mCnc: 117 mg/dL
Hgb A1c MFr Bld: 5.7 % — ABNORMAL HIGH (ref 4.8–5.6)

## 2019-04-03 ENCOUNTER — Ambulatory Visit: Payer: Self-pay | Admitting: Specialist

## 2019-04-03 ENCOUNTER — Ambulatory Visit: Payer: Self-pay | Admitting: Gerontology

## 2019-04-03 ENCOUNTER — Other Ambulatory Visit: Payer: Self-pay

## 2019-04-03 VITALS — BP 113/79 | HR 98 | Temp 97.0°F | Ht 66.0 in | Wt 196.0 lb

## 2019-04-03 DIAGNOSIS — Z8619 Personal history of other infectious and parasitic diseases: Secondary | ICD-10-CM

## 2019-04-03 DIAGNOSIS — M25562 Pain in left knee: Secondary | ICD-10-CM

## 2019-04-03 DIAGNOSIS — E785 Hyperlipidemia, unspecified: Secondary | ICD-10-CM

## 2019-04-03 DIAGNOSIS — I10 Essential (primary) hypertension: Secondary | ICD-10-CM

## 2019-04-03 DIAGNOSIS — R7303 Prediabetes: Secondary | ICD-10-CM

## 2019-04-03 DIAGNOSIS — F17200 Nicotine dependence, unspecified, uncomplicated: Secondary | ICD-10-CM

## 2019-04-03 MED ORDER — AMLODIPINE BESYLATE 5 MG PO TABS: 5.0000 mg | ORAL_TABLET | Freq: Every day | ORAL | 3 refills | Status: DC

## 2019-04-03 MED ORDER — SIMVASTATIN 10 MG PO TABS: 10.0000 mg | ORAL_TABLET | Freq: Every evening | ORAL | 3 refills | Status: DC

## 2019-04-03 NOTE — Progress Notes (Signed)
Established Patient Office Visit  Subjective:  Patient ID: Elaine Robinson, female    DOB: 06/16/1955  Age: 63 y.o. MRN: [id number]  CC:  Chief Complaint  Patient presents with  . Hypertension  . Leg Pain    left leg, fluid around knee    HPI Petrita Hotchkiss presents for follow up of hypertension, hyperlipidemia, prediabetes and lab review. She's compliant with her medications and denies chest pain, palpitation , peripheral edema and myalgia. She continues to smoke 1 pack of cigarette in four days and admits the desire to quit. Her HgbA1c done on 03/28/2019 was 5.7 %. She was seen at the ED on 03/05/2019 for left knee pain, x ray showed mild osteoarthritis, without acute osseous abnormality per Dr Antionette Poles. Currently she continues to experience swelling and non radiating sharp pain to left knee when ambulating. She states that pain intensity increases from 6-9 with activity. She reports that taking 15 mg Meloxicam didn't relieve pain. She denies muscle weakness and numbness. She also reports having a history of genital herpes which was not treated. She states that she had outbreak 2 weeks ago and requests to be treated. She states that she checks her blood pressure weekly, adheres to DASH and ADA diet and unable to exercise as she wanted due to her left knee pain. She continues to smoke 1 pack of cigarette daily and admits the desire to quit. She states that she's doing well and offers no further complaint.  Past Medical History:  Diagnosis Date  . Depression   . Hypertension     Past Surgical History:  Procedure Laterality Date  . ABDOMINAL HYSTERECTOMY    . BREAST BIOPSY Right 05/03/2012   stereo bx    No family history on file.  Social History   Socioeconomic History  . Marital status: Widowed    Spouse name: Not on file  . Number of children: 1  . Years of education: 11th grade  . Highest education level: 11th grade  Occupational History  . Not on file  Social Needs  .  Financial resource strain: Not very hard  . Food insecurity    Worry: Never true    Inability: Never true  . Transportation needs    Medical: No    Non-medical: No  Tobacco Use  . Smoking status: Current Some Day Smoker  . Smokeless tobacco: Never Used  Substance and Sexual Activity  . Alcohol use: Not Currently  . Drug use: Not Currently  . Sexual activity: Not Currently    Birth control/protection: None  Lifestyle  . Physical activity    Days per week: Not on file    Minutes per session: Not on file  . Stress: To some extent  Relationships  . Social connections    Talks on phone: More than three times a week    Gets together: More than three times a week    Attends religious service: More than 4 times per year    Active member of club or organization: Yes    Attends meetings of clubs or organizations: More than 4 times per year    Relationship status: Widowed  . Intimate partner violence    Fear of current or ex partner: No    Emotionally abused: No    Physically abused: No    Forced sexual activity: No  Other Topics Concern  . Not on file  Social History Narrative  . Not on file    Outpatient Medications Prior to Visit  Medication Sig Dispense Refill  . aspirin 81 MG chewable tablet Chew by mouth daily.    . sertraline (ZOLOFT) 100 MG tablet Take 1 tablet (100 mg total) by mouth daily. 30 tablet 2  . amLODipine (NORVASC) 5 MG tablet Take 1 tablet (5 mg total) by mouth daily. 30 tablet 3  . meloxicam (MOBIC) 15 MG tablet Take 1 tablet (15 mg total) by mouth daily. 30 tablet 0  . simvastatin (ZOCOR) 10 MG tablet Take 1 tablet (10 mg total) by mouth every evening. 30 tablet 3  . vitamin B-12 (CYANOCOBALAMIN) 100 MCG tablet Take 100 mcg by mouth daily.     No facility-administered medications prior to visit.     Allergies  Allergen Reactions  . Lisinopril     ROS Review of Systems  Constitutional: Negative.   Respiratory: Negative.   Cardiovascular:  Negative.   Musculoskeletal: Positive for arthralgias (Left knee).  Skin: Negative.   Neurological: Negative.   Psychiatric/Behavioral: Negative.       Objective:    Physical Exam  Constitutional: She appears well-developed.  HENT:  Head: Normocephalic and atraumatic.  Cardiovascular: Normal rate and regular rhythm.  Pulmonary/Chest: Effort normal and breath sounds normal.  Musculoskeletal:     Left knee: She exhibits swelling. She exhibits no erythema. No tenderness (with palpation) found.    BP 113/79 (BP Location: Left Arm, Patient Position: Sitting)   Pulse 98   Temp (!) 97 F (36.1 C)   Ht 5\' 6"  (1.676 m)   Wt 196 lb (88.9 kg)   SpO2 99%   BMI 31.64 kg/m  Wt Readings from Last 3 Encounters:  04/03/19 196 lb (88.9 kg)  03/05/19 200 lb (90.7 kg)  01/30/19 202 lb (91.6 kg)   She lost  4 pounds and was encouraged to continue on her weight loss regimen.  Health Maintenance Due  Topic Date Due  . Hepatitis C Screening  [birth year]  . HIV Screening  01/11/1973  . TETANUS/TDAP  01/11/1977  . PAP SMEAR-Modifier  01/12/1979  . COLONOSCOPY  01/12/2008    There are no preventive care reminders to display for this patient.  Lab Results  Component Value Date   TSH 1.940 12/27/2018   Lab Results  Component Value Date   WBC 4.7 12/27/2018   HGB 12.1 12/27/2018   HCT 36.5 12/27/2018   MCV 86 12/27/2018   PLT 302 12/27/2018   Lab Results  Component Value Date   NA 142 12/27/2018   K 4.4 12/27/2018   CO2 22 12/27/2018   GLUCOSE 98 12/27/2018   BUN 12 12/27/2018   CREATININE 0.72 12/27/2018   BILITOT 0.4 12/27/2018   ALKPHOS 73 12/27/2018   AST 15 12/27/2018   ALT 9 12/27/2018   PROT 7.4 12/27/2018   ALBUMIN 4.5 12/27/2018   CALCIUM 9.5 12/27/2018   Lab Results  Component Value Date   CHOL 134 12/27/2018   Lab Results  Component Value Date   HDL 46 12/27/2018   Lab Results  Component Value Date   LDLCALC 74 12/27/2018   Lab Results  Component  Value Date   TRIG 69 12/27/2018   Lab Results  Component Value Date   CHOLHDL 2.9 12/27/2018   Lab Results  Component Value Date   HGBA1C 5.7 (H) 03/28/2019      Assessment & Plan:    1. Essential hypertension - Her blood pressure is controlled and she will continue on current treatment regimen. -She will continue on Low salt DASH  diet -Take medications regularly on time -Exercise regularly as tolerated -Check blood pressure at least once a week at home or a nearby pharmacy and record -Goal is less than 140/90 and normal blood pressure is less than 120/80 - amLODipine (NORVASC) 5 MG tablet; Take 1 tablet (5 mg total) by mouth daily.  Dispense: 30 tablet; Refill: 3  2. Elevated lipids - Her Lipid panel is normal, she will continue on current treatment regimen. - She will continue on Low fat Diet, like low fat dairy products eg skimmed milk -Avoid any fried food -Regular exercise/walk -Goal for Total Cholesterol is less than 200 -Goal for bad cholesterol LDL is less than 70 -Goal for Good cholesterol HDL is more than 45 -Goal for Triglyceride is less than 150 - simvastatin (ZOCOR) 10 MG tablet; Take 1 tablet (10 mg total) by mouth every evening.  Dispense: 30 tablet; Refill: 3  3. History of herpes genitalis - She was provided with [place] for her to schedule a Herpes test. She was advised to notify clinic of any outbreak.  4. Prediabetes - Her HgbA1c done on 03/28/2019 was 5.7%, she continues to make life style modifications. She was encouraged to continue on low carb/ non concentrated sweet diet.  5. Smoking - She was strongly encouraged on smoking cessation and was referred to [facility]. - Ambulatory referral to Hematology / Oncology  6. Acute pain of left knee - She was advised to take otc Tylenol 650 mg every 8 hours as tolerated and not to exceed 4000 mg a day. She was encouraged to complete charity care application for -  Ambulatory referral to Orthopedic Surgery     Follow-up: Return in about 20 days (around 04/23/2019), or if symptoms worsen or fail to improve.    Aislee Landgren Jerold Coombe, NP

## 2019-04-03 NOTE — Progress Notes (Signed)
   Subjective:    Patient ID: Elaine Robinson, female    DOB: 1955/09/21, 63 y.o.   MRN: [id number]  HPI  63 y/o, had a twisting episode one month ago which caused L knee swelling. She does not have a job outside of the house.  Knee does not feel unstable, she was seen at the hospital and was told that she had OA. They put her on meloxican without success. She also complains of popping when walking. Review of Systems     Objective:   Physical Exam On examination, she has a minimal limp with walking, this is worse with toe walking and very bad with heel walking. With those, she does not fully extend her knee, she is able to accomplish tandem gate, and is able to a full squat and rise ain a monophasic fashion. In the sitting position, she is unable to bring her pants leg above her knee but looking at her from the side there is no question that the knee is swollen. She has full EXT. With minimal PF crepitis. In supine position, she has a 2+ effusion; she has 0-120 degrees of FLEX. In 0-30 degrees of FLEX, there is no ML laxity. The Lachman test is negative; with FLEX/ROT, there is no clicking or pain reproduced.        Assessment & Plan:  I am going to refer her for arthrocentesis and a steroid injectio, we have nothing further to offer her.

## 2019-04-05 ENCOUNTER — Other Ambulatory Visit: Payer: Self-pay

## 2019-04-05 ENCOUNTER — Telehealth: Payer: Self-pay | Admitting: *Deleted

## 2019-04-05 ENCOUNTER — Ambulatory Visit: Payer: Self-pay | Admitting: Licensed Clinical Social Worker

## 2019-04-05 ENCOUNTER — Other Ambulatory Visit: Payer: Self-pay | Admitting: Gerontology

## 2019-04-05 DIAGNOSIS — F331 Major depressive disorder, recurrent, moderate: Secondary | ICD-10-CM

## 2019-04-05 DIAGNOSIS — Z87891 Personal history of nicotine dependence: Secondary | ICD-10-CM

## 2019-04-05 DIAGNOSIS — M25562 Pain in left knee: Secondary | ICD-10-CM

## 2019-04-05 DIAGNOSIS — F411 Generalized anxiety disorder: Secondary | ICD-10-CM

## 2019-04-05 MED ORDER — DICLOFENAC SODIUM 1 % EX GEL: 4.0000 g | Freq: Four times a day (QID) | CUTANEOUS | 0 refills | Status: DC

## 2019-04-05 MED ORDER — SERTRALINE HCL 100 MG PO TABS: 100.0000 mg | ORAL_TABLET | Freq: Every day | ORAL | 2 refills | Status: DC

## 2019-04-05 NOTE — BH Specialist Note (Signed)
Integrated Behavioral Health Follow Up Visit Via Phone  MRN: [id number] Name: Elaine Robinson   Type of Service: [place] Interpretor:No. Interpretor Name and Language:   SUBJECTIVE: Elaine Robinson is a 63 y.o. female accompanied by herself. Patient was referred by . Patient reports the following symptoms/concerns: She notes that she has been doing okay. She explains that her pain level and being lonely affects her mood. She notes that she has not lived in [place] for very long and needs some friends. She reports that she just started going to church with her ex's brother and is hoping to meet some friends there. She explains that going to church and praying has lifted her spirits. She denies suicidal and homicidal thoughts.  Duration of problem: ; Severity of problem: mild  OBJECTIVE: Mood: Euthymic and Affect: Appropriate Risk of harm to self or others: No plan to harm self or others  LIFE CONTEXT: Family and Social: see above. School/Work: see above. Self-Care: see above. Life Changes: see above.  GOALS ADDRESSED: Patient will: 1.  Reduce symptoms of: depression  2.  Increase knowledge and/or ability of: coping skills and self-management skills  3.  Demonstrate ability to: Increase healthy adjustment to current life circumstances  INTERVENTIONS: Interventions utilized:  Supportive Counseling was utilized by the clinician focusing on the patient's depression. Clinician processed with the patient regarding how she has been doing since the last follow up session. Clinician reviewed the nurse practitioner's recent progress note with her regarding her pain and the plan of treatment. Clinician suggested that the patient try to make friends at the church she has been attending. Clinician encouraged the patient to continue to go to church and surround herself with supportive people.  Standardized Assessments completed: GAD-7 and PHQ  9  ASSESSMENT: Patient currently experiencingsee above.   Patient may benefit from see above.  PLAN: 1. Follow up with behavioral health clinician on : three weeks or earlier if needed.  2. Behavioral recommendations: see above.  3. Referral(s): [place] (In Clinic) 4. "From scale of 1-10, how likely are you to follow plan?":   Bayard Hugger, LCSW

## 2019-04-05 NOTE — Telephone Encounter (Signed)
Received referral for initial lung cancer screening scan. Contacted patient and obtained smoking history,(current, 33.75 pack year) as well as answering questions related to screening process. Patient denies signs of lung cancer such as weight loss or hemoptysis. Patient denies comorbidity that would prevent curative treatment if lung cancer were found. Patient is scheduled for shared decision making visit and CT scan on 04/18/19 10am.

## 2019-04-06 ENCOUNTER — Encounter: Payer: Self-pay | Admitting: Advanced Practice Midwife

## 2019-04-06 ENCOUNTER — Other Ambulatory Visit: Payer: Self-pay

## 2019-04-06 ENCOUNTER — Ambulatory Visit: Payer: Self-pay | Admitting: Family Medicine

## 2019-04-06 DIAGNOSIS — B009 Herpesviral infection, unspecified: Secondary | ICD-10-CM

## 2019-04-06 DIAGNOSIS — Z90710 Acquired absence of both cervix and uterus: Secondary | ICD-10-CM | POA: Insufficient documentation

## 2019-04-06 MED ORDER — VALACYCLOVIR HCL 1 G PO TABS: 500.0000 mg | ORAL_TABLET | Freq: Every day | ORAL | 11 refills | Status: DC

## 2019-04-06 NOTE — Progress Notes (Signed)
Here today for STD screening. Declines bloodwork. Trinette Vera, RN ? ?

## 2019-04-06 NOTE — Progress Notes (Signed)
AVS printed and given to patient.Jenetta Downer, RN

## 2019-04-06 NOTE — Progress Notes (Signed)
STI clinic/screening visit  Subjective:  Elaine Robinson is a 63 y.o. female being seen today for an STI screening visit. The patient reports they do have symptoms.  Patient reports that they do not desire a pregnancy in the next year.   They reported they are not interested in discussing contraception today.   Patient has the following medical conditions:   Patient Active Problem List   Diagnosis Date Noted  . H/O abdominal hysterectomy 04/06/2019  . History of herpes genitalis 04/03/2019  . Smoking 04/03/2019  . Left knee pain 04/03/2019  . Prediabetes 01/30/2019  . Encounter to establish care 12/21/2018  . Essential hypertension 12/21/2018  . Elevated lipids 12/21/2018  . History of depression 12/21/2018  . Visual blurriness 12/21/2018     Chief Complaint  Patient presents with  . SEXUALLY TRANSMITTED DISEASE    HPI  Patient reports having HSV outbreaks 1x/mo, lasts about a week. She would like to restart suppressive medication. She has used this in the past and worked well.  Having prodrome feeling now, no current lesions.   See flowsheet for further details and programmatic requirements.    The following portions of the patient's history were reviewed and updated as appropriate: allergies, current medications, past medical history, past social history, past surgical history and problem list.  Objective:  There were no vitals filed for this visit.  Physical Exam Gen: well appearing, NAD HEENT: no scleral icterus CV: RR Lung: Normal WOB Ext: warm well perfused, no edema Skin: buttocks with scattered hyperpigmented patches (HSV scars).   Assessment and Plan:  Elaine Robinson is a 63 y.o. female presenting to the [facility] Department for STI screening  1. HSV (herpes simplex virus) infection -Rx 500mg -1000mg  valacyclovir daily. She is having frequent outbreaks but d/t age will start 500mg  and advised to increase to full pill (1000mg ) if not sufficient  for suppression.  - valACYclovir (VALTREX) 1000 MG tablet; Take 0.5 tablets (500 mg total) by mouth daily.  Dispense: 30 tablet; Refill: 11  Pt declines all STI testing today.   Return in about 1 year (around 04/05/2020).  Future Appointments  Date Time Provider [place]  04/18/2019 10:00 AM Jacquelin Hawking, NP [facility] None  04/18/2019 10:30 AM [facility]  04/23/2019 10:30 AM Iloabachie, Chioma E, NP [facility] None  04/24/2019  2:00 PM Julian Hy B, LCSW [facility] None    Kandee Keen, PA-C

## 2019-04-17 ENCOUNTER — Encounter: Payer: Self-pay | Admitting: Oncology

## 2019-04-18 ENCOUNTER — Inpatient Hospital Stay: Payer: Self-pay | Attending: Oncology | Admitting: Oncology

## 2019-04-18 ENCOUNTER — Other Ambulatory Visit: Payer: Self-pay

## 2019-04-18 ENCOUNTER — Ambulatory Visit
Admission: RE | Admit: 2019-04-18 | Discharge: 2019-04-18 | Disposition: A | Discharge status: Home / Self Care | Payer: Self-pay | Source: Ambulatory Visit | Attending: Oncology | Admitting: Oncology

## 2019-04-18 DIAGNOSIS — Z87891 Personal history of nicotine dependence: Secondary | ICD-10-CM | POA: Insufficient documentation

## 2019-04-18 NOTE — Progress Notes (Signed)
Virtual Visit via Video Note  I connected with [name] on 04/18/19 at 10:00 AM EST by a video enabled telemedicine application and verified that I am speaking with the correct person using two identifiers.  Location: Patient: opic Provider: office   I discussed the limitations of evaluation and management by telemedicine and the availability of in person appointments. The patient expressed understanding and agreed to proceed.  I discussed the assessment and treatment plan with the patient. The patient was provided an opportunity to ask questions and all were answered. The patient agreed with the plan and demonstrated an understanding of the instructions.   The patient was advised to call back or seek an in-person evaluation if the symptoms worsen or if the condition fails to improve as anticipated.   In accordance with CMS guidelines, patient has met eligibility criteria including age, absence of signs or symptoms of lung cancer.  Social History   Tobacco Use  . Smoking status: Current Some Day Smoker    Packs/day: 0.75    Years: 45.00    Pack years: 33.75    Types: Cigarettes  . Smokeless tobacco: Never Used  Substance Use Topics  . Alcohol use: Not Currently  . Drug use: Not Currently      A shared decision-making session was conducted prior to the performance of CT scan. This includes one or more decision aids, includes benefits and harms of screening, follow-up diagnostic testing, over-diagnosis, false positive rate, and total radiation exposure.   Counseling on the importance of adherence to annual lung cancer LDCT screening, impact of co-morbidities, and ability or willingness to undergo diagnosis and treatment is imperative for compliance of the program.   Counseling on the importance of continued smoking cessation for former smokers; the importance of smoking cessation for current smokers, and information about tobacco cessation interventions have been given to patient  including [place] and [facility] quit [place] programs.   Written order for lung cancer screening with LDCT has been given to the patient and any and all questions have been answered to the best of my abilities.    Yearly follow up will be coordinated by Burgess Estelle, Thoracic Navigator.  I provided 15 minutes of face-to-face video visit time during this encounter, and > 50% was spent counseling as documented under my assessment & plan.   Jacquelin Hawking, NP

## 2019-04-19 ENCOUNTER — Encounter: Payer: Self-pay | Admitting: *Deleted

## 2019-04-23 ENCOUNTER — Ambulatory Visit: Payer: Self-pay | Admitting: Gerontology

## 2019-04-23 ENCOUNTER — Encounter: Payer: Self-pay | Admitting: Gerontology

## 2019-04-23 ENCOUNTER — Other Ambulatory Visit: Payer: Self-pay

## 2019-04-23 DIAGNOSIS — M25562 Pain in left knee: Secondary | ICD-10-CM

## 2019-04-23 DIAGNOSIS — I10 Essential (primary) hypertension: Secondary | ICD-10-CM

## 2019-04-23 DIAGNOSIS — F17200 Nicotine dependence, unspecified, uncomplicated: Secondary | ICD-10-CM

## 2019-04-23 DIAGNOSIS — R7303 Prediabetes: Secondary | ICD-10-CM

## 2019-04-23 NOTE — Patient Instructions (Signed)
DASH Eating Plan DASH stands for "Dietary Approaches to Stop Hypertension." The DASH eating plan is a healthy eating plan that has been shown to reduce high blood pressure (hypertension). It may also reduce your risk for type 2 diabetes, heart disease, and stroke. The DASH eating plan may also help with weight loss. What are tips for following this plan?  General guidelines  Avoid eating more than 2,300 mg (milligrams) of salt (sodium) a day. If you have hypertension, you may need to reduce your sodium intake to 1,500 mg a day.  Limit alcohol intake to no more than 1 drink a day for nonpregnant women and 2 drinks a day for men. One drink equals 12 oz of beer, 5 oz of wine, or 1 oz of hard liquor.  Work with your health care provider to maintain a healthy body weight or to lose weight. Ask what an ideal weight is for you.  Get at least 30 minutes of exercise that causes your heart to beat faster (aerobic exercise) most days of the week. Activities may include walking, swimming, or biking.  Work with your health care provider or diet and nutrition specialist (dietitian) to adjust your eating plan to your individual calorie needs. Reading food labels   Check food labels for the amount of sodium per serving. Choose foods with less than 5 percent of the Daily Value of sodium. Generally, foods with less than 300 mg of sodium per serving fit into this eating plan.  To find whole grains, look for the word "whole" as the first word in the ingredient list. Shopping  Buy products labeled as "low-sodium" or "no salt added."  Buy fresh foods. Avoid canned foods and premade or frozen meals. Cooking  Avoid adding salt when cooking. Use salt-free seasonings or herbs instead of table salt or sea salt. Check with your health care provider or pharmacist before using salt substitutes.  Do not fry foods. Cook foods using healthy methods such as baking, boiling, grilling, and broiling instead.  Cook with  heart-healthy oils, such as olive, canola, soybean, or sunflower oil. Meal planning  Eat a balanced diet that includes: ? 5 or more servings of fruits and vegetables each day. At each meal, try to fill half of your plate with fruits and vegetables. ? Up to 6-8 servings of whole grains each day. ? Less than 6 oz of lean meat, poultry, or fish each day. A 3-oz serving of meat is about the same size as a deck of cards. One egg equals 1 oz. ? 2 servings of low-fat dairy each day. ? A serving of nuts, seeds, or beans 5 times each week. ? Heart-healthy fats. Healthy fats called Omega-3 fatty acids are found in foods such as flaxseeds and coldwater fish, like sardines, salmon, and mackerel.  Limit how much you eat of the following: ? Canned or prepackaged foods. ? Food that is high in trans fat, such as fried foods. ? Food that is high in saturated fat, such as fatty meat. ? Sweets, desserts, sugary drinks, and other foods with added sugar. ? Full-fat dairy products.  Do not salt foods before eating.  Try to eat at least 2 vegetarian meals each week.  Eat more home-cooked food and less restaurant, buffet, and fast food.  When eating at a restaurant, ask that your food be prepared with less salt or no salt, if possible. What foods are recommended? The items listed may not be a complete list. Talk with your dietitian about   what dietary choices are best for you. Grains Whole-grain or whole-wheat bread. Whole-grain or whole-wheat pasta. Brown rice. Oatmeal. Quinoa. Bulgur. Whole-grain and low-sodium cereals. Pita bread. Low-fat, low-sodium crackers. Whole-wheat flour tortillas. Vegetables Fresh or frozen vegetables (raw, steamed, roasted, or grilled). Low-sodium or reduced-sodium tomato and vegetable juice. Low-sodium or reduced-sodium tomato sauce and tomato paste. Low-sodium or reduced-sodium canned vegetables. Fruits All fresh, dried, or frozen fruit. Canned fruit in natural juice (without  added sugar). Meat and other protein foods Skinless chicken or turkey. Ground chicken or turkey. Pork with fat trimmed off. Fish and seafood. Egg whites. Dried beans, peas, or lentils. Unsalted nuts, nut butters, and seeds. Unsalted canned beans. Lean cuts of beef with fat trimmed off. Low-sodium, lean deli meat. Dairy Low-fat (1%) or fat-free (skim) milk. Fat-free, low-fat, or reduced-fat cheeses. Nonfat, low-sodium ricotta or cottage cheese. Low-fat or nonfat yogurt. Low-fat, low-sodium cheese. Fats and oils Soft margarine without trans fats. Vegetable oil. Low-fat, reduced-fat, or light mayonnaise and salad dressings (reduced-sodium). Canola, safflower, olive, soybean, and sunflower oils. Avocado. Seasoning and other foods Herbs. Spices. Seasoning mixes without salt. Unsalted popcorn and pretzels. Fat-free sweets. What foods are not recommended? The items listed may not be a complete list. Talk with your dietitian about what dietary choices are best for you. Grains Baked goods made with fat, such as croissants, muffins, or some breads. Dry pasta or rice meal packs. Vegetables Creamed or fried vegetables. Vegetables in a cheese sauce. Regular canned vegetables (not low-sodium or reduced-sodium). Regular canned tomato sauce and paste (not low-sodium or reduced-sodium). Regular tomato and vegetable juice (not low-sodium or reduced-sodium). Pickles. Olives. Fruits Canned fruit in a light or heavy syrup. Fried fruit. Fruit in cream or butter sauce. Meat and other protein foods Fatty cuts of meat. Ribs. Fried meat. Bacon. Sausage. Bologna and other processed lunch meats. Salami. Fatback. Hotdogs. Bratwurst. Salted nuts and seeds. Canned beans with added salt. Canned or smoked fish. Whole eggs or egg yolks. Chicken or turkey with skin. Dairy Whole or 2% milk, cream, and half-and-half. Whole or full-fat cream cheese. Whole-fat or sweetened yogurt. Full-fat cheese. Nondairy creamers. Whipped toppings.  Processed cheese and cheese spreads. Fats and oils Butter. Stick margarine. Lard. Shortening. Ghee. Bacon fat. Tropical oils, such as coconut, palm kernel, or palm oil. Seasoning and other foods Salted popcorn and pretzels. Onion salt, garlic salt, seasoned salt, table salt, and sea salt. Worcestershire sauce. Tartar sauce. Barbecue sauce. Teriyaki sauce. Soy sauce, including reduced-sodium. Steak sauce. Canned and packaged gravies. Fish sauce. Oyster sauce. Cocktail sauce. Horseradish that you find on the shelf. Ketchup. Mustard. Meat flavorings and tenderizers. Bouillon cubes. Hot sauce and Tabasco sauce. Premade or packaged marinades. Premade or packaged taco seasonings. Relishes. Regular salad dressings. Where to find more information:  National Heart, Lung, and Blood Institute: www.nhlbi.nih.gov  American Heart Association: www.heart.org Summary  The DASH eating plan is a healthy eating plan that has been shown to reduce high blood pressure (hypertension). It may also reduce your risk for type 2 diabetes, heart disease, and stroke.  With the DASH eating plan, you should limit salt (sodium) intake to 2,300 mg a day. If you have hypertension, you may need to reduce your sodium intake to 1,500 mg a day.  When on the DASH eating plan, aim to eat more fresh fruits and vegetables, whole grains, lean proteins, low-fat dairy, and heart-healthy fats.  Work with your health care provider or diet and nutrition specialist (dietitian) to adjust your eating plan to your   individual calorie needs. This information is not intended to replace advice given to you by your health care provider. Make sure you discuss any questions you have with your health care provider. Document Released: 04/01/2011 Document Revised: 03/25/2017 Document Reviewed: 04/05/2016 Elsevier Patient Education  2020 [employer]. Smoking Tobacco Information, Adult Smoking tobacco can be harmful to your health. Tobacco contains a  poisonous (toxic), colorless chemical called nicotine. Nicotine is addictive. It changes the brain and can make it hard to stop smoking. Tobacco also has other toxic chemicals that can hurt your body and raise your risk of many cancers. How can smoking tobacco affect me? Smoking tobacco puts you at risk for:  Cancer. Smoking is most commonly associated with lung cancer, but can also lead to cancer in other parts of the body.  Chronic obstructive pulmonary disease (COPD). This is a long-term lung condition that makes it hard to breathe. It also gets worse over time.  High blood pressure (hypertension), heart disease, stroke, or heart attack.  Lung infections, such as pneumonia.  Cataracts. This is when the lenses in the eyes become clouded.  Digestive problems. This may include peptic ulcers, heartburn, and gastroesophageal reflux disease (GERD).  Oral health problems, such as gum disease and tooth loss.  Loss of taste and smell. Smoking can affect your appearance by causing:  Wrinkles.  Yellow or stained teeth, fingers, and fingernails. Smoking tobacco can also affect your social life, because:  It may be challenging to find places to smoke when away from home. Many workplaces, [employer], hotels, and public places are tobacco-free.  Smoking is expensive. This is due to the cost of tobacco and the long-term costs of treating health problems from smoking.  Secondhand smoke may affect those around you. Secondhand smoke can cause lung cancer, breathing problems, and heart disease. Children of smokers have a higher risk for: ? Sudden infant death syndrome (SIDS). ? Ear infections. ? Lung infections. If you currently smoke tobacco, quitting now can help you:  Lead a longer and healthier life.  Look, smell, breathe, and feel better over time.  Save money.  Protect others from the harms of secondhand smoke. What actions can I take to prevent health problems? Quit smoking   Do  not start smoking. Quit if you already do.  Make a plan to quit smoking and commit to it. Look for programs to help you and ask your health care provider for recommendations and ideas.  Set a date and write down all the reasons you want to quit.  Let your friends and family know you are quitting so they can help and support you. Consider finding friends who also want to quit. It can be easier to quit with someone else, so that you can support each other.  Talk with your health care provider about using nicotine replacement medicines to help you quit, such as gum, lozenges, patches, sprays, or pills.  Do not replace cigarette smoking with electronic cigarettes, which are commonly called e-cigarettes. The safety of e-cigarettes is not known, and some may contain harmful chemicals.  If you try to quit but return to smoking, stay positive. It is common to slip up when you first quit, so take it one day at a time.  Be prepared for cravings. When you feel the urge to smoke, chew gum or suck on hard candy. Lifestyle  Stay busy and take care of your body.  Drink enough fluid to keep your urine pale yellow.  Get plenty of exercise  and eat a healthy diet. This can help prevent weight gain after quitting.  Monitor your eating habits. Quitting smoking can cause you to have a larger appetite than when you smoke.  Find ways to relax. Go out with friends or family to a movie or a restaurant where people do not smoke.  Ask your health care provider about having regular tests (screenings) to check for cancer. This may include blood tests, imaging tests, and other tests.  Find ways to manage your stress, such as meditation, yoga, or exercise. Where to find support To get support to quit smoking, consider:  Asking your health care provider for more information and resources.  Taking classes to learn more about quitting smoking.  Looking for local organizations that offer resources about quitting  smoking.  Joining a support group for people who want to quit smoking in your local community.  Calling the smokefree.gov counselor helpline: 1-800-Quit-Now 430-448-2527) Where to find more information You may find more information about quitting smoking from:  HelpGuide.org: www.helpguide.org  https://hall.com/: smokefree.gov  American Lung Association: www.lung.org Contact a health care provider if you:  Have problems breathing.  Notice that your lips, nose, or fingers turn blue.  Have chest pain.  Are coughing up blood.  Feel faint or you pass out.  Have other health changes that cause you to worry. Summary  Smoking tobacco can negatively affect your health, the health of those around you, your finances, and your social life.  Do not start smoking. Quit if you already do. If you need help quitting, ask your health care provider.  Think about joining a support group for people who want to quit smoking in your local community. There are many effective programs that will help you to quit this behavior. This information is not intended to replace advice given to you by your health care provider. Make sure you discuss any questions you have with your health care provider. Document Released: 04/27/2016 Document Revised: 06/01/2017 Document Reviewed: 04/27/2016 Elsevier Patient Education  2020 [place] for Diabetes Mellitus, Adult  Carbohydrate counting is a method of keeping track of how many carbohydrates you eat. Eating carbohydrates naturally increases the amount of sugar (glucose) in the blood. Counting how many carbohydrates you eat helps keep your blood glucose within normal limits, which helps you manage your diabetes (diabetes mellitus). It is important to know how many carbohydrates you can safely have in each meal. This is different for every person. A diet and nutrition specialist (registered dietitian) can help you make a meal plan and  calculate how many carbohydrates you should have at each meal and snack. Carbohydrates are found in the following foods:  Grains, such as breads and cereals.  Dried beans and soy products.  Starchy vegetables, such as potatoes, peas, and corn.  Fruit and fruit juices.  Milk and yogurt.  Sweets and snack foods, such as cake, cookies, candy, chips, and soft drinks. How do I count carbohydrates? There are two ways to count carbohydrates in food. You can use either of the methods or a combination of both. Reading "Nutrition Facts" on packaged food The "Nutrition Facts" list is included on the labels of almost all packaged foods and beverages in the [place] It includes:  The serving size.  Information about nutrients in each serving, including the grams (g) of carbohydrate per serving. To use the "Nutrition Facts":  Decide how many servings you will have.  Multiply the number of servings by the number of carbohydrates  per serving.  The resulting number is the total amount of carbohydrates that you will be having. Learning standard serving sizes of other foods When you eat carbohydrate foods that are not packaged or do not include "Nutrition Facts" on the label, you need to measure the servings in order to count the amount of carbohydrates:  Measure the foods that you will eat with a food scale or measuring cup, if needed.  Decide how many standard-size servings you will eat.  Multiply the number of servings by 15. Most carbohydrate-rich foods have about 15 g of carbohydrates per serving. ? For example, if you eat 8 oz (170 g) of strawberries, you will have eaten 2 servings and 30 g of carbohydrates (2 servings x 15 g = 30 g).  For foods that have more than one food mixed, such as soups and casseroles, you must count the carbohydrates in each food that is included. The following list contains standard serving sizes of common carbohydrate-rich foods. Each of these servings has about 15  g of carbohydrates:   hamburger bun or  English muffin.   oz (15 mL) syrup.   oz (14 g) jelly.  1 slice of bread.  1 six-inch tortilla.  3 oz (85 g) cooked rice or pasta.  4 oz (113 g) cooked dried beans.  4 oz (113 g) starchy vegetable, such as peas, corn, or potatoes.  4 oz (113 g) hot cereal.  4 oz (113 g) mashed potatoes or  of a large baked potato.  4 oz (113 g) canned or frozen fruit.  4 oz (120 mL) fruit juice.  4-6 crackers.  6 chicken nuggets.  6 oz (170 g) unsweetened dry cereal.  6 oz (170 g) plain fat-free yogurt or yogurt sweetened with artificial sweeteners.  8 oz (240 mL) milk.  8 oz (170 g) fresh fruit or one small piece of fruit.  24 oz (680 g) popped popcorn. Example of carbohydrate counting Sample meal  3 oz (85 g) chicken breast.  6 oz (170 g) brown rice.  4 oz (113 g) corn.  8 oz (240 mL) milk.  8 oz (170 g) strawberries with sugar-free whipped topping. Carbohydrate calculation 1. Identify the foods that contain carbohydrates: ? Rice. ? Corn. ? Milk. ? Strawberries. 2. Calculate how many servings you have of each food: ? 2 servings rice. ? 1 serving corn. ? 1 serving milk. ? 1 serving strawberries. 3. Multiply each number of servings by 15 g: ? 2 servings rice x 15 g = 30 g. ? 1 serving corn x 15 g = 15 g. ? 1 serving milk x 15 g = 15 g. ? 1 serving strawberries x 15 g = 15 g. 4. Add together all of the amounts to find the total grams of carbohydrates eaten: ? 30 g + 15 g + 15 g + 15 g = 75 g of carbohydrates total. Summary  Carbohydrate counting is a method of keeping track of how many carbohydrates you eat.  Eating carbohydrates naturally increases the amount of sugar (glucose) in the blood.  Counting how many carbohydrates you eat helps keep your blood glucose within normal limits, which helps you manage your diabetes.  A diet and nutrition specialist (registered dietitian) can help you make a meal plan and  calculate how many carbohydrates you should have at each meal and snack. This information is not intended to replace advice given to you by your health care provider. Make sure you discuss any questions you  have with your health care provider. Document Released: 04/12/2005 Document Revised: 11/04/2016 Document Reviewed: 09/24/2015 Elsevier Patient Education  2020 [employer].

## 2019-04-23 NOTE — Progress Notes (Signed)
Established Patient Office Visit  Subjective:  Patient ID: Elaine Robinson, female    DOB: [birth year]  Age: 63 y.o. MRN: [id number]  CC:  Chief Complaint  Patient presents with  . Hypertension  Patient consents to telephone visit, and 2 patient identifiers was used to identify patient.  HPI Elaine Robinson presents for follow up of hypertension, prediabetes and left knee pain. She states that she is compliant with her medications, adheres to DASH , low carbohydrate and non concentrated sweet diet. She was seen on 04/03/2019 by the clinic Orthopedic Surgeon Dr Vickki Hearing and she was referred for arthrocentesis and steroid injection.she states that she will follow up with orthopedic in [place] on 04/25/2019.  She reports that she has not been using the Voltaren gel because she has no money to purchase it since its an over the counter medication. She reports that taking Tylenol offers minimal relief to left knee. She was also seen at the [facility] on 04/06/2019 for c/o HSV and was started on 500 mg and increase to 1000 mg Valacyclovir for suppression.  She states that she is tolerating medication and has not had any outbreak.  She was also seen at the [facility] by Lorretta Harp.E.NP and had low dose chest CT done on 04/18/2019 for lung cancer screening. It showed  Lung-RADS 1S, negative. Continue annual screening with low-dose chest CT without contrast in 12 months.  The "S" modifier above refers to potentially clinically significant non lung cancer related findings. Specifically, there is aortic atherosclerosis, in addition to left circumflex coronary artery disease. Please note that although the presence of coronary artery calcium documents the presence of coronary artery disease, the severity of this disease and any potential stenosis cannot be assessed on this non-gated CT examination. Assessment for potential risk factor modification, dietary therapy or pharmacologic therapy may be  warranted, if clinically indicated. Mild diffuse bronchial wall thickening with very mild centrilobular and paraseptal emphysema; imaging findings suggestive of underlying COPD, per Dr Weber Cooks D.  She reports that she continues to smoke 1 pack of cigarettes in 4 days, and admits the desire to quit.  She denies chest pain, palpitation, lightheadedness, peripheral edema, myalgia, fever and chills.  She states that her mood is good, denies suicidal nor homicidal ideation.  She states that she is doing well and offers no further complaint.    Past Medical History:  Diagnosis Date  . Depression   . Hypertension     Past Surgical History:  Procedure Laterality Date  . ABDOMINAL HYSTERECTOMY    . BREAST BIOPSY Right 05/03/2012   stereo bx    History reviewed. No pertinent family history.  Social History   Socioeconomic History  . Marital status: Widowed    Spouse name: Not on file  . Number of children: 1  . Years of education: 11th grade  . Highest education level: 11th grade  Occupational History  . Not on file  Tobacco Use  . Smoking status: Current Some Day Smoker    Packs/day: 0.75    Years: 45.00    Pack years: 33.75    Types: Cigarettes  . Smokeless tobacco: Never Used  Substance and Sexual Activity  . Alcohol use: Not Currently  . Drug use: Not Currently  . Sexual activity: Not Currently    Birth control/protection: None  Other Topics Concern  . Not on file  Social History Narrative  . Not on file   Social Determinants of Health   Financial Resource  Strain: Low Risk   . Difficulty of Paying Living Expenses: Not very hard  Food Insecurity: No Food Insecurity  . Worried About Charity fundraiser in the Last Year: Never true  . Ran Out of Food in the Last Year: Never true  Transportation Needs: No Transportation Needs  . Lack of Transportation (Medical): No  . Lack of Transportation (Non-Medical): No  Physical Activity:   . Days of Exercise per Week: Not on  file  . Minutes of Exercise per Session: Not on file  Stress: Stress Concern Present  . Feeling of Stress : To some extent  Social Connections: Slightly Isolated  . Frequency of Communication with Friends and Family: More than three times a week  . Frequency of Social Gatherings with Friends and Family: More than three times a week  . Attends Religious Services: More than 4 times per year  . Active Member of Clubs or Organizations: Yes  . Attends Archivist Meetings: More than 4 times per year  . Marital Status: Widowed  Intimate Partner Violence: Not At Risk  . Fear of Current or Ex-Partner: No  . Emotionally Abused: No  . Physically Abused: No  . Sexually Abused: No    Outpatient Medications Prior to Visit  Medication Sig Dispense Refill  . amLODipine (NORVASC) 5 MG tablet Take 1 tablet (5 mg total) by mouth daily. 30 tablet 3  . aspirin 81 MG chewable tablet Chew by mouth daily.    . diclofenac Sodium (VOLTAREN) 1 % GEL Apply 4 g topically 4 (four) times daily. 50 g 0  . sertraline (ZOLOFT) 100 MG tablet Take 1 tablet (100 mg total) by mouth daily. 30 tablet 2  . simvastatin (ZOCOR) 10 MG tablet Take 1 tablet (10 mg total) by mouth every evening. 30 tablet 3  . valACYclovir (VALTREX) 1000 MG tablet Take 0.5 tablets (500 mg total) by mouth daily. 30 tablet 11   No facility-administered medications prior to visit.    Allergies  Allergen Reactions  . Lisinopril     ROS Review of Systems  Constitutional: Negative.   HENT: Negative.   Respiratory: Negative.   Cardiovascular: Negative.   Endocrine: Negative.   Musculoskeletal: Positive for arthralgias (left knee pain).  Neurological: Negative.   Psychiatric/Behavioral: Negative.       Objective:    Physical Exam No physical exam was done. There were no vitals taken for this visit. Wt Readings from Last 3 Encounters:  04/18/19 196 lb (88.9 kg)  04/03/19 196 lb (88.9 kg)  03/05/19 200 lb (90.7 kg)      Health Maintenance Due  Topic Date Due  . Hepatitis C Screening  1956-02-18  . HIV Screening  01/11/1973  . TETANUS/TDAP  01/11/1977  . PAP SMEAR-Modifier  01/12/1979  . COLONOSCOPY  01/12/2008    There are no preventive care reminders to display for this patient.  Lab Results  Component Value Date   TSH 1.940 12/27/2018   Lab Results  Component Value Date   WBC 4.7 12/27/2018   HGB 12.1 12/27/2018   HCT 36.5 12/27/2018   MCV 86 12/27/2018   PLT 302 12/27/2018   Lab Results  Component Value Date   NA 142 12/27/2018   K 4.4 12/27/2018   CO2 22 12/27/2018   GLUCOSE 98 12/27/2018   BUN 12 12/27/2018   CREATININE 0.72 12/27/2018   BILITOT 0.4 12/27/2018   ALKPHOS 73 12/27/2018   AST 15 12/27/2018   ALT 9 12/27/2018  PROT 7.4 12/27/2018   ALBUMIN 4.5 12/27/2018   CALCIUM 9.5 12/27/2018   Lab Results  Component Value Date   CHOL 134 12/27/2018   Lab Results  Component Value Date   HDL 46 12/27/2018   Lab Results  Component Value Date   LDLCALC 74 12/27/2018   Lab Results  Component Value Date   TRIG 69 12/27/2018   Lab Results  Component Value Date   CHOLHDL 2.9 12/27/2018   Lab Results  Component Value Date   HGBA1C 5.7 (H) 03/28/2019      Assessment & Plan:    1. Essential hypertension -She states that she does not check her blood pressure at home, but will continue on current treatment regimen. -She was encouraged to continue on low salt DASH diet -Take medications regularly on time -Exercise regularly as tolerated -Check blood pressure at least once a week at home or a nearby pharmacy and record -Goal is less than 150/90 and normal blood pressure is less than 120/80    2. Prediabetes -Her hemoglobin A1c was 5.7%, and she deferred Metformin therapy.  She states that she will continue on low-carbohydrate /no concentrated sweet diet and exercise as tolerated.  3. Smoking -She had low dose chest CT scan done, which showed aortic  atherosclerosis, left circumflex CAD,  mild diffuse bronchial wall thickening with very mild centrilobular and paraseptal emphysema, imaging findings was suggestive of underlying COPD per Dr Weber Cooks D. She will continue on 10 mg Simvastatin daily and was strongly encouraged on smoking cessation [place] quit line number was provided.  4. Acute pain of left knee -She will follow-up with orthopedic on 04/25/2019.    Follow-up: Return in about 8 weeks (around 06/21/2019), or if symptoms worsen or fail to improve.    Teresita Fanton Jerold Coombe, NP

## 2019-04-24 ENCOUNTER — Ambulatory Visit: Payer: Self-pay | Admitting: Licensed Clinical Social Worker

## 2019-04-26 ENCOUNTER — Ambulatory Visit (INDEPENDENT_AMBULATORY_CARE_PROVIDER_SITE_OTHER): Payer: Self-pay | Admitting: Orthopaedic Surgery

## 2019-04-26 ENCOUNTER — Other Ambulatory Visit: Payer: Self-pay

## 2019-04-26 DIAGNOSIS — M25562 Pain in left knee: Secondary | ICD-10-CM

## 2019-04-26 DIAGNOSIS — G8929 Other chronic pain: Secondary | ICD-10-CM

## 2019-04-26 MED ORDER — METHYLPREDNISOLONE ACETATE 40 MG/ML IJ SUSP
40.0000 mg | INTRAMUSCULAR | Status: AC | PRN
  Administered 2019-04-26: 15:00:00 40 mg via INTRA_ARTICULAR

## 2019-04-26 MED ORDER — LIDOCAINE HCL 1 % IJ SOLN
3.0000 mL | INTRAMUSCULAR | Status: AC | PRN
  Administered 2019-04-26: 3 mL

## 2019-04-26 NOTE — Progress Notes (Signed)
Office Visit Note   Patient: Elaine Robinson           Date of Birth: 07-Jan-1956           MRN: [id number] Visit Date: 04/26/2019              Requested by: Langston Reusing, NP [place],  [place] [id number] PCP: Langston Reusing, NP   Assessment & Plan: Visit Diagnoses:  1. Chronic pain of left knee     Plan: I was able to aspirate between 40 and 50 cc of yellow fluid from the knee that was otherwise clear.  There is no evidence infection or gout.  This is likely reactive from either some arthritis or potentially even a meniscal tear.  She tolerated the injection very well as well as a steroid injection and felt much better afterwards when I took the fluid off her knee.  She walked around the exam room feeling much better.  All question concerns were answered and addressed.  I would like to see her back in about 2 to 3 weeks to make sure that she is doing well.  If she has recurrence of her effusion and pain I would recommend an MRI of that knee.  Follow-Up Instructions: No follow-ups on file.   Orders:  Orders Placed This Encounter  Procedures  . Large Joint Inj   No orders of the defined types were placed in this encounter.     Procedures: Large Joint Inj: L knee on 04/26/2019 2:36 PM Indications: diagnostic evaluation and pain Details: 22 G 1.5 in needle, superolateral approach  Arthrogram: No  Medications: 3 mL lidocaine 1 %; 40 mg methylPREDNISolone acetate 40 MG/ML Outcome: tolerated well, no immediate complications Procedure, treatment alternatives, risks and benefits explained, specific risks discussed. Consent was given by the patient. Immediately prior to procedure a time out was called to verify the correct patient, procedure, equipment, support staff and site/side marked as required. Patient was prepped and draped in the usual sterile fashion.       Clinical Data: No additional findings.   Subjective: Chief Complaint    Patient presents with  . Left Knee - Pain  The patient is a very pleasant 63 year old female who comes in for evaluation treatment of left knee pain.  Her knee has been hurting her for a few months now.  She does have x-rays of her knee that were negative for any type of acute injury.  She says she thinks she may have just awkwardly moved her knee or twisted it wrong and she reports a lot of pain in the back of her knee and she points to the posterior medial aspect of her left knee as a source of her pain.  She is not a diabetic.  She has had some meloxicam but that has not helped her.  It hurts mainly with weightbearing and pivoting activities.  She has never had surgery on the left knee and has not injured this before.  HPI  Review of Systems She currently denies any headache, chest pain, shortness of breath, fever, chills, nausea, vomiting Objective: Vital Signs: There were no vitals taken for this visit.  Physical Exam She is alert and orient x3 and in no acute distress Ortho Exam Examination of her left knee does show some slight fullness and effusion comparing the right and left knees.  The left knee is more painful along the medial joint line and posterior medial.  She does have some irritation when I rotated the tibia on the femur to the left knee and medial compartment.  She does have pain with the knee posterior medial past 90 degrees of flexion but she can flex and extend her knee fully.  Her Lachman's exam is negative. Specialty Comments:  No specialty comments available.  Imaging: No results found. X-rays on the canopy system showing the patient's left knee showed no acute findings.  There is mild arthritic changes but otherwise no significant malalignment.  PMFS History: Patient Active Problem List   Diagnosis Date Noted  . H/O abdominal hysterectomy 04/06/2019  . History of herpes genitalis 04/03/2019  . Smoking 04/03/2019  . Left knee pain 04/03/2019  . Prediabetes  01/30/2019  . Encounter to establish care 12/21/2018  . Essential hypertension 12/21/2018  . Elevated lipids 12/21/2018  . History of depression 12/21/2018  . Visual blurriness 12/21/2018   Past Medical History:  Diagnosis Date  . Depression   . Hypertension     No family history on file.  Past Surgical History:  Procedure Laterality Date  . ABDOMINAL HYSTERECTOMY    . BREAST BIOPSY Right 05/03/2012   stereo bx   Social History   Occupational History  . Not on file  Tobacco Use  . Smoking status: Current Some Day Smoker    Packs/day: 0.75    Years: 45.00    Pack years: 33.75    Types: Cigarettes  . Smokeless tobacco: Never Used  Substance and Sexual Activity  . Alcohol use: Not Currently  . Drug use: Not Currently  . Sexual activity: Not Currently    Birth control/protection: None

## 2019-05-08 ENCOUNTER — Other Ambulatory Visit: Payer: Self-pay

## 2019-05-08 ENCOUNTER — Ambulatory Visit: Payer: Self-pay | Admitting: Licensed Clinical Social Worker

## 2019-05-08 DIAGNOSIS — F331 Major depressive disorder, recurrent, moderate: Secondary | ICD-10-CM

## 2019-05-08 DIAGNOSIS — F411 Generalized anxiety disorder: Secondary | ICD-10-CM

## 2019-05-08 NOTE — BH Specialist Note (Signed)
Integrated Behavioral Health Follow Up Visit Via Phone  MRN: [id number] Name: Elaine Robinson  Type of Service: [place] Interpretor:No. Interpretor Name and Language: Not applicable.   SUBJECTIVE: Elaine Robinson is a 64 y.o. female accompanied by herself. Patient was referred by Carlyon Shadow NP for mental health.  Patient reports the following symptoms/concerns: She notes that she ended up having a good Christmas and New Year's. She reports that she has started a dinner for the homeless in December that stay about a block or less from her and is going to that again today. She explains that the lord laid it on her heart to do something positive in the community. She notes that her goal is to cook and serve the homeless at least once to twice a month. She notes that her mood has improved somewhat since she got a roommate in the last month. She explains that she gets out of the house during the week when her daughter is working to make sure her grand children are focusing on school and remote learning. She notes that she has been walking laps around the large parking lot of the apartment complex that she lives in. She notes that she has been going to church on a regular basis and enjoys it. She explains that she becomes anxious when she wants to get some things done and put it out of the way. She denies suicidal and homicidal thoughts.  Duration of problem: ; Severity of problem: mild  OBJECTIVE: Mood: Euthymic and Affect: Appropriate Risk of harm to self or others: No plan to harm self or others  LIFE CONTEXT: Family and Social:see above. School/Work: see above. Self-Care: see above. Life Changes: see above.  GOALS ADDRESSED: Patient will: 1.  Reduce symptoms of: anxiety  2.  Increase knowledge and/or ability of: coping skills  3.  Demonstrate ability to: Increase healthy adjustment to current life circumstances  INTERVENTIONS: Interventions  utilized:  Brief CBT was utilized by the clinician during today's follow up session focusing on the patient's anxiety. Clinician processed with the patient regarding how she has been doing since the last follow up session. Clinician encouraged the patient to not allow her to become stressed out by utilizing her coping skills and practicing self care.  Standardized Assessments completed: GAD-7 and PHQ 9  ASSESSMENT: Patient currently experiencing see above.   Patient may benefit from see above.  PLAN: 1. Follow up with behavioral health clinician on : two to three weeks or earlier if needed.  2. Behavioral recommendations: see above. 3. Referral(s): [facility] (In Clinic) 4. "From scale of 1-10, how likely are you to follow plan?":  Bayard Hugger, LCSW

## 2019-05-24 ENCOUNTER — Encounter: Payer: Self-pay | Admitting: Orthopaedic Surgery

## 2019-05-24 ENCOUNTER — Ambulatory Visit (INDEPENDENT_AMBULATORY_CARE_PROVIDER_SITE_OTHER): Payer: Self-pay | Admitting: Orthopaedic Surgery

## 2019-05-24 ENCOUNTER — Other Ambulatory Visit: Payer: Self-pay

## 2019-05-24 DIAGNOSIS — M25462 Effusion, left knee: Secondary | ICD-10-CM

## 2019-05-24 DIAGNOSIS — G8929 Other chronic pain: Secondary | ICD-10-CM

## 2019-05-24 DIAGNOSIS — M25562 Pain in left knee: Secondary | ICD-10-CM

## 2019-05-24 NOTE — Progress Notes (Signed)
The patient is a 64 year old female that I saw just under a month ago for her left knee.  Her left knee has been swelling and hurting for about 2 months with no known injury.  We were able to aspirate about 50 cc of clear fluid off the knee and placed a steroid in the knee.  She says her pain is better since the injection but she still has stiffness and the swelling is back and is reaccumulating effusion.  She does have some locking and catching with pivoting activities with that left knee.  On exam there is a reaccumulation of fluid of the left knee.  It is ligamentously stable on exam but has global tenderness.  At this point a MRI of her left knee is warranted to rule out a meniscal tear or to look for other internal derangement given the recurrent effusions.  Her plain films show well-maintained joint space.  I feel this is medically reasonable at this point given the recurrent effusion of her left knee so we get an idea of why this is the case.  We will work on ordering the MRI and then we will see her back in follow-up to go over this.  All questions and concerns were answered and addressed.

## 2019-05-29 ENCOUNTER — Ambulatory Visit: Payer: Self-pay | Admitting: Licensed Clinical Social Worker

## 2019-05-29 ENCOUNTER — Telehealth: Payer: Self-pay | Admitting: Licensed Clinical Social Worker

## 2019-05-29 NOTE — Telephone Encounter (Signed)
Clinician attempted to contact the patient twice during their scheduled phone visit but the voicemail box has not been set up yet.

## 2019-06-04 ENCOUNTER — Other Ambulatory Visit: Payer: Self-pay

## 2019-06-04 ENCOUNTER — Ambulatory Visit
Admission: RE | Admit: 2019-06-04 | Discharge: 2019-06-04 | Disposition: A | Discharge status: Home / Self Care | Payer: Self-pay | Source: Ambulatory Visit | Attending: Orthopaedic Surgery | Admitting: Orthopaedic Surgery

## 2019-06-04 DIAGNOSIS — G8929 Other chronic pain: Secondary | ICD-10-CM | POA: Insufficient documentation

## 2019-06-04 DIAGNOSIS — M25562 Pain in left knee: Secondary | ICD-10-CM | POA: Insufficient documentation

## 2019-06-07 ENCOUNTER — Ambulatory Visit: Payer: Self-pay | Admitting: Orthopaedic Surgery

## 2019-06-11 ENCOUNTER — Ambulatory Visit (INDEPENDENT_AMBULATORY_CARE_PROVIDER_SITE_OTHER): Payer: Self-pay | Admitting: Orthopaedic Surgery

## 2019-06-11 ENCOUNTER — Encounter: Payer: Self-pay | Admitting: Orthopaedic Surgery

## 2019-06-11 ENCOUNTER — Other Ambulatory Visit: Payer: Self-pay

## 2019-06-11 DIAGNOSIS — S83242D Other tear of medial meniscus, current injury, left knee, subsequent encounter: Secondary | ICD-10-CM

## 2019-06-11 NOTE — Progress Notes (Signed)
The patient comes in today to go over a MRI of her left knee.  She is only 64 years old with normal-appearing plain films.  She was having locking and catching of that knee and continues to have medial joint line tenderness with locking and catching.  She had failed conservative treatment including activity modification as well as quad strengthening exercises and a steroid injection.  On exam she does still have medial joint line tenderness and a positive McMurray sign to the left knee medial joint line.  Her MRI does confirm a complex posterior horn mid body medial meniscal tear with significant cartilage thinning in the medial compartment but no full-thickness cartilage defect.  I went over knee model and her MRI in detail and explained what this means.  Given the fact that she is still having her mechanical symptoms with locking and catching as well as pain we are recommending arthroscopic intervention.  I explained the risk and benefits of surgery and what the surgery involves as an outpatient.  We talked about her interoperative and postoperative course and what to expect.  All question concerns were answered and addressed.  We will work on getting this scheduled and see her back at 1 week postoperative.

## 2019-06-20 ENCOUNTER — Other Ambulatory Visit: Payer: Self-pay | Admitting: Physician Assistant

## 2019-06-21 ENCOUNTER — Ambulatory Visit: Payer: Self-pay | Admitting: Gerontology

## 2019-06-21 ENCOUNTER — Other Ambulatory Visit: Payer: Self-pay

## 2019-06-21 ENCOUNTER — Encounter: Payer: Self-pay | Admitting: Gerontology

## 2019-06-21 DIAGNOSIS — I10 Essential (primary) hypertension: Secondary | ICD-10-CM

## 2019-06-21 DIAGNOSIS — E785 Hyperlipidemia, unspecified: Secondary | ICD-10-CM

## 2019-06-21 DIAGNOSIS — G8929 Other chronic pain: Secondary | ICD-10-CM

## 2019-06-21 MED ORDER — SIMVASTATIN 10 MG PO TABS: 10.0000 mg | ORAL_TABLET | Freq: Every evening | ORAL | 3 refills | Status: DC

## 2019-06-21 MED ORDER — AMLODIPINE BESYLATE 5 MG PO TABS: 5.0000 mg | ORAL_TABLET | Freq: Every day | ORAL | 3 refills | Status: DC

## 2019-06-21 NOTE — Patient Instructions (Signed)
DASH Eating Plan DASH stands for "Dietary Approaches to Stop Hypertension." The DASH eating plan is a healthy eating plan that has been shown to reduce high blood pressure (hypertension). It may also reduce your risk for type 2 diabetes, heart disease, and stroke. The DASH eating plan may also help with weight loss. What are tips for following this plan?  General guidelines  Avoid eating more than 2,300 mg (milligrams) of salt (sodium) a day. If you have hypertension, you may need to reduce your sodium intake to 1,500 mg a day.  Limit alcohol intake to no more than 1 drink a day for nonpregnant women and 2 drinks a day for men. One drink equals 12 oz of beer, 5 oz of wine, or 1 oz of hard liquor.  Work with your health care provider to maintain a healthy body weight or to lose weight. Ask what an ideal weight is for you.  Get at least 30 minutes of exercise that causes your heart to beat faster (aerobic exercise) most days of the week. Activities may include walking, swimming, or biking.  Work with your health care provider or diet and nutrition specialist (dietitian) to adjust your eating plan to your individual calorie needs. Reading food labels   Check food labels for the amount of sodium per serving. Choose foods with less than 5 percent of the Daily Value of sodium. Generally, foods with less than 300 mg of sodium per serving fit into this eating plan.  To find whole grains, look for the word "whole" as the first word in the ingredient list. Shopping  Buy products labeled as "low-sodium" or "no salt added."  Buy fresh foods. Avoid canned foods and premade or frozen meals. Cooking  Avoid adding salt when cooking. Use salt-free seasonings or herbs instead of table salt or sea salt. Check with your health care provider or pharmacist before using salt substitutes.  Do not fry foods. Cook foods using healthy methods such as baking, boiling, grilling, and broiling instead.  Cook with  heart-healthy oils, such as olive, canola, soybean, or sunflower oil. Meal planning  Eat a balanced diet that includes: ? 5 or more servings of fruits and vegetables each day. At each meal, try to fill half of your plate with fruits and vegetables. ? Up to 6-8 servings of whole grains each day. ? Less than 6 oz of lean meat, poultry, or fish each day. A 3-oz serving of meat is about the same size as a deck of cards. One egg equals 1 oz. ? 2 servings of low-fat dairy each day. ? A serving of nuts, seeds, or beans 5 times each week. ? Heart-healthy fats. Healthy fats called Omega-3 fatty acids are found in foods such as flaxseeds and coldwater fish, like sardines, salmon, and mackerel.  Limit how much you eat of the following: ? Canned or prepackaged foods. ? Food that is high in trans fat, such as fried foods. ? Food that is high in saturated fat, such as fatty meat. ? Sweets, desserts, sugary drinks, and other foods with added sugar. ? Full-fat dairy products.  Do not salt foods before eating.  Try to eat at least 2 vegetarian meals each week.  Eat more home-cooked food and less restaurant, buffet, and fast food.  When eating at a restaurant, ask that your food be prepared with less salt or no salt, if possible. What foods are recommended? The items listed may not be a complete list. Talk with your dietitian about   what dietary choices are best for you. Grains Whole-grain or whole-wheat bread. Whole-grain or whole-wheat pasta. Brown rice. Oatmeal. Quinoa. Bulgur. Whole-grain and low-sodium cereals. Pita bread. Low-fat, low-sodium crackers. Whole-wheat flour tortillas. Vegetables Fresh or frozen vegetables (raw, steamed, roasted, or grilled). Low-sodium or reduced-sodium tomato and vegetable juice. Low-sodium or reduced-sodium tomato sauce and tomato paste. Low-sodium or reduced-sodium canned vegetables. Fruits All fresh, dried, or frozen fruit. Canned fruit in natural juice (without  added sugar). Meat and other protein foods Skinless chicken or turkey. Ground chicken or turkey. Pork with fat trimmed off. Fish and seafood. Egg whites. Dried beans, peas, or lentils. Unsalted nuts, nut butters, and seeds. Unsalted canned beans. Lean cuts of beef with fat trimmed off. Low-sodium, lean deli meat. Dairy Low-fat (1%) or fat-free (skim) milk. Fat-free, low-fat, or reduced-fat cheeses. Nonfat, low-sodium ricotta or cottage cheese. Low-fat or nonfat yogurt. Low-fat, low-sodium cheese. Fats and oils Soft margarine without trans fats. Vegetable oil. Low-fat, reduced-fat, or light mayonnaise and salad dressings (reduced-sodium). Canola, safflower, olive, soybean, and sunflower oils. Avocado. Seasoning and other foods Herbs. Spices. Seasoning mixes without salt. Unsalted popcorn and pretzels. Fat-free sweets. What foods are not recommended? The items listed may not be a complete list. Talk with your dietitian about what dietary choices are best for you. Grains Baked goods made with fat, such as croissants, muffins, or some breads. Dry pasta or rice meal packs. Vegetables Creamed or fried vegetables. Vegetables in a cheese sauce. Regular canned vegetables (not low-sodium or reduced-sodium). Regular canned tomato sauce and paste (not low-sodium or reduced-sodium). Regular tomato and vegetable juice (not low-sodium or reduced-sodium). Pickles. Olives. Fruits Canned fruit in a light or heavy syrup. Fried fruit. Fruit in cream or butter sauce. Meat and other protein foods Fatty cuts of meat. Ribs. Fried meat. Bacon. Sausage. Bologna and other processed lunch meats. Salami. Fatback. Hotdogs. Bratwurst. Salted nuts and seeds. Canned beans with added salt. Canned or smoked fish. Whole eggs or egg yolks. Chicken or turkey with skin. Dairy Whole or 2% milk, cream, and half-and-half. Whole or full-fat cream cheese. Whole-fat or sweetened yogurt. Full-fat cheese. Nondairy creamers. Whipped toppings.  Processed cheese and cheese spreads. Fats and oils Butter. Stick margarine. Lard. Shortening. Ghee. Bacon fat. Tropical oils, such as coconut, palm kernel, or palm oil. Seasoning and other foods Salted popcorn and pretzels. Onion salt, garlic salt, seasoned salt, table salt, and sea salt. Worcestershire sauce. Tartar sauce. Barbecue sauce. Teriyaki sauce. Soy sauce, including reduced-sodium. Steak sauce. Canned and packaged gravies. Fish sauce. Oyster sauce. Cocktail sauce. Horseradish that you find on the shelf. Ketchup. Mustard. Meat flavorings and tenderizers. Bouillon cubes. Hot sauce and Tabasco sauce. Premade or packaged marinades. Premade or packaged taco seasonings. Relishes. Regular salad dressings. Where to find more information:  National Heart, Lung, and Blood Institute: www.nhlbi.nih.gov  American Heart Association: www.heart.org Summary  The DASH eating plan is a healthy eating plan that has been shown to reduce high blood pressure (hypertension). It may also reduce your risk for type 2 diabetes, heart disease, and stroke.  With the DASH eating plan, you should limit salt (sodium) intake to 2,300 mg a day. If you have hypertension, you may need to reduce your sodium intake to 1,500 mg a day.  When on the DASH eating plan, aim to eat more fresh fruits and vegetables, whole grains, lean proteins, low-fat dairy, and heart-healthy fats.  Work with your health care provider or diet and nutrition specialist (dietitian) to adjust your eating plan to your   individual calorie needs. This information is not intended to replace advice given to you by your health care provider. Make sure you discuss any questions you have with your health care provider. Document Revised: 03/25/2017 Document Reviewed: 04/05/2016 Elsevier Patient Education  2020 Elsevier Inc.  

## 2019-06-21 NOTE — Progress Notes (Signed)
Established Patient Office Visit  Subjective:  Patient ID: Elaine Robinson, female    DOB: Apr 14, 1956  Age: 64 y.o. MRN: [id number]  CC:  Chief Complaint  Patient presents with  . Hypertension  Patient consents to telephone visit and 2 patient identifiers was used to identify patient.  HPI Elaine Robinson presents for  follow up of hypertension, left knee pain and medication refill. She states that she is compliant with her medications, and adheres to [place]. 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 She had an MRI of her left knee on 06/04/2019 and it showed complex tear posterior horn medial meniscus includes a large radial component peripheral to the root and a horizontal component extending into the posterior body.  Radial tear along the free edge of the midbody of the lateral meniscus. Radial tear along the free edge and undersurface in the central posterior horn is also seen.  Marrow edema about the knee is intense in the medial tibial plateau where it is consistent with stress change. No fracture. Osteoarthritis about the knee appears worst in the medial compartment by Dr Pollyann Kennedy T. She was seen by Orthopedic Surgeon Dr Ninfa [name] C.Y on 06/11/2019, and arthroscopic intervention was recommended. She states that her surgery is scheduled for 06/28/2019. Overall, she states that she's doing well and offers no further complaint.  Past Medical History:  Diagnosis Date  . Arthritis    hands left knee  . Depression   . Hypertension   . MMT (medial meniscus tear)    left knee    Past Surgical History:  Procedure Laterality Date  . ABDOMINAL HYSTERECTOMY    . BREAST BIOPSY Right 05/03/2012   stereo bx    No family history on file.  Social History   Socioeconomic History  . Marital status: Widowed    Spouse name: Not on file  . Number of children: 1  . Years of education: 11th grade  . Highest education  level: 11th grade  Occupational History  . Not on file  Tobacco Use  . Smoking status: Current Every Day Smoker    Packs/day: 0.25    Years: 45.00    Pack years: 11.25    Types: Cigarettes  . Smokeless tobacco: Never Used  Substance and Sexual Activity  . Alcohol use: Not Currently  . Drug use: Not Currently  . Sexual activity: Not Currently    Birth control/protection: None, Surgical  Other Topics Concern  . Not on file  Social History Narrative  . Not on file   Social Determinants of Health   Financial Resource Strain: Low Risk   . Difficulty of Paying Living Expenses: Not very hard  Food Insecurity: No Food Insecurity  . Worried About Charity fundraiser in the Last Year: Never true  . Ran Out of Food in the Last Year: Never true  Transportation Needs: No Transportation Needs  . Lack of Transportation (Medical): No  . Lack of Transportation (Non-Medical): No  Physical Activity:   . Days of Exercise per Week: Not on file  . Minutes of Exercise per Session: Not on file  Stress: Stress Concern Present  . Feeling of Stress : To some extent  Social Connections: Slightly Isolated  . Frequency of Communication with Friends and Family: More than three times a week  . Frequency of Social Gatherings with Friends and Family: More than three times a week  . Attends Religious  Services: More than 4 times per year  . Active Member of Clubs or Organizations: Yes  . Attends Archivist Meetings: More than 4 times per year  . Marital Status: Widowed  Intimate Partner Violence: Not At Risk  . Fear of Current or Ex-Partner: No  . Emotionally Abused: No  . Physically Abused: No  . Sexually Abused: No    Outpatient Medications Prior to Visit  Medication Sig Dispense Refill  . aspirin 81 MG chewable tablet Chew by mouth daily.    . sertraline (ZOLOFT) 100 MG tablet Take 1 tablet (100 mg total) by mouth daily. 30 tablet 2  . valACYclovir (VALTREX) 1000 MG tablet Take 0.5  tablets (500 mg total) by mouth daily. 30 tablet 11  . amLODipine (NORVASC) 5 MG tablet Take 1 tablet (5 mg total) by mouth daily. 30 tablet 3  . simvastatin (ZOCOR) 10 MG tablet Take 1 tablet (10 mg total) by mouth every evening. 30 tablet 3  . diclofenac Sodium (VOLTAREN) 1 % GEL Apply 4 g topically 4 (four) times daily. (Patient not taking: Reported on 06/21/2019) 50 g 0   No facility-administered medications prior to visit.    Allergies  Allergen Reactions  . Lisinopril Cough    ROS Review of Systems  Constitutional: Negative.   Respiratory: Negative.   Cardiovascular: Negative.   Musculoskeletal: Positive for arthralgias (chronic left knee pain).  Neurological: Negative.   Psychiatric/Behavioral: Negative.       Objective:    Physical Exam No physical exam was done There were no vitals taken for this visit. Wt Readings from Last 3 Encounters:  04/18/19 196 lb (88.9 kg)  04/03/19 196 lb (88.9 kg)  03/05/19 200 lb (90.7 kg)     Health Maintenance Due  Topic Date Due  . Hepatitis C Screening  07/01/1957  . HIV Screening  01/11/1973  . TETANUS/TDAP  01/11/1977  . PAP SMEAR-Modifier  01/12/1979  . COLONOSCOPY  01/12/2008    There are no preventive care reminders to display for this patient.  Lab Results  Component Value Date   TSH 1.940 12/27/2018   Lab Results  Component Value Date   WBC 4.7 12/27/2018   HGB 12.1 12/27/2018   HCT 36.5 12/27/2018   MCV 86 12/27/2018   PLT 302 12/27/2018   Lab Results  Component Value Date   NA 142 12/27/2018   K 4.4 12/27/2018   CO2 22 12/27/2018   GLUCOSE 98 12/27/2018   BUN 12 12/27/2018   CREATININE 0.72 12/27/2018   BILITOT 0.4 12/27/2018   ALKPHOS 73 12/27/2018   AST 15 12/27/2018   ALT 9 12/27/2018   PROT 7.4 12/27/2018   ALBUMIN 4.5 12/27/2018   CALCIUM 9.5 12/27/2018   Lab Results  Component Value Date   CHOL 134 12/27/2018   Lab Results  Component Value Date   HDL 46 12/27/2018   Lab Results   Component Value Date   LDLCALC 74 12/27/2018   Lab Results  Component Value Date   TRIG 69 12/27/2018   Lab Results  Component Value Date   CHOLHDL 2.9 12/27/2018   Lab Results  Component Value Date   HGBA1C 5.7 (H) 03/28/2019      Assessment & Plan:   1. Essential hypertension - She will continue on current treatment regimen and her goal blood pressure should be less than 150/90 and she was advised to continue on DASH diet. Check blood pressure at least once a week at home or a  nearby pharmacy and record. - amLODipine (NORVASC) 5 MG tablet; Take 1 tablet (5 mg total) by mouth daily.  Dispense: 30 tablet; Refill: 3  2. Elevated lipids - She will continue on current medication and was advised to continue on low fat/cholesterol diet, exercise as tolerated. - simvastatin (ZOCOR) 10 MG tablet; Take 1 tablet (10 mg total) by mouth every evening.  Dispense: 30 tablet; Refill: 3  3. Chronic pain of left knee - She was encouraged to keep her surgery appointment on 06/28/2019.     Follow-up: Return in about 8 weeks (around 08/16/2019), or if symptoms worsen or fail to improve.    Laelah Siravo Jerold Coombe, NP

## 2019-06-22 ENCOUNTER — Encounter (HOSPITAL_BASED_OUTPATIENT_CLINIC_OR_DEPARTMENT_OTHER): Payer: Self-pay | Admitting: Orthopaedic Surgery

## 2019-06-22 ENCOUNTER — Other Ambulatory Visit: Payer: Self-pay

## 2019-06-25 ENCOUNTER — Other Ambulatory Visit (HOSPITAL_COMMUNITY): Admission: RE | Admit: 2019-06-25 | Payer: Self-pay | Source: Ambulatory Visit

## 2019-06-25 ENCOUNTER — Encounter (HOSPITAL_BASED_OUTPATIENT_CLINIC_OR_DEPARTMENT_OTHER)
Admission: RE | Admit: 2019-06-25 | Discharge: 2019-06-25 | Disposition: A | Discharge status: Home / Self Care | Payer: Self-pay | Source: Ambulatory Visit | Attending: Orthopaedic Surgery | Admitting: Orthopaedic Surgery

## 2019-06-25 NOTE — Progress Notes (Signed)

## 2019-06-26 ENCOUNTER — Other Ambulatory Visit (HOSPITAL_COMMUNITY)
Admission: RE | Admit: 2019-06-26 | Discharge: 2019-06-26 | Disposition: A | Discharge status: Home / Self Care | Payer: HRSA Program | Source: Ambulatory Visit | Attending: Orthopaedic Surgery | Admitting: Orthopaedic Surgery

## 2019-06-26 ENCOUNTER — Other Ambulatory Visit: Payer: Self-pay

## 2019-06-26 ENCOUNTER — Ambulatory Visit: Payer: Self-pay | Admitting: Licensed Clinical Social Worker

## 2019-06-26 DIAGNOSIS — Z01812 Encounter for preprocedural laboratory examination: Secondary | ICD-10-CM | POA: Diagnosis present

## 2019-06-26 DIAGNOSIS — Z20822 Contact with and (suspected) exposure to covid-19: Secondary | ICD-10-CM | POA: Diagnosis not present

## 2019-06-26 DIAGNOSIS — F411 Generalized anxiety disorder: Secondary | ICD-10-CM

## 2019-06-26 DIAGNOSIS — F331 Major depressive disorder, recurrent, moderate: Secondary | ICD-10-CM

## 2019-06-26 LAB — SARS CORONAVIRUS 2 (TAT 6-24 HRS): SARS Coronavirus 2: NEGATIVE

## 2019-06-26 NOTE — BH Specialist Note (Signed)
Integrated Behavioral Health Follow Up Visit Via Phone  MRN: [id number] Name: Elaine Robinson  Type of Service: [place] Interpretor:No. Interpretor Name and Language:   SUBJECTIVE: Elaine Robinson is a 64 y.o. female accompanied by herself. Patient was referred by  Patient reports the following symptoms/concerns: She notes that she cannot focus, sit still, and is constantly moving around. She notes that she feels like she is unable to sit or be still. She notes that her anxiety is manageable and not that bad. She notes that she is sleeping pretty good at night. She notes that she gets down and depressed when she thinks about her oldest grandchild. She notes that she is praying for her grandson who has his own mental health problems and worries about him. She notes that when she sits in the house for days that it gets to her, causing her to feel to sad and irritated. She notes that she relies on her daughter for transportation so there are limitations to getting out as much as she would like. She notes that she wants to get her driver's license back so she can be able to drive again before the year is out. She denies suicidal and homicidal thoughts.  Duration of problem: ; Severity of problem: moderate  OBJECTIVE: Mood: Euthymic and Affect: Appropriate Risk of harm to self or others: No plan to harm self or others  LIFE CONTEXT: Family and Social: see above. School/Work: see above. Self-Care: see above. Life Changes: see above.  GOALS ADDRESSED: Patient will: 1.  Reduce symptoms of: anxiety  2.  Increase knowledge and/or ability of: coping skills and self-management skills  3.  Demonstrate ability to: Increase healthy adjustment to current life circumstances  INTERVENTIONS: Interventions utilized:  Supportive Counseling was utilized by the clinician during today's follow up session. Clinician processed with the patient regarding how she has been  doing since the last follow up session. Clinician measured the patient's anxiety and depression on a numerical scale. Clinician explained to the patient that it sounds like she is feeling anxious and restless on the days that she does not leave the house. Clinician suggested that the patient look into volunteering somewhere as a way to fill up her time and do something positive in the community.  Standardized Assessments completed: GAD-7 and PHQ 9  ASSESSMENT: Patient currently experiencing see above.   Patient may benefit from see above.  PLAN: 1. Follow up with behavioral health clinician on :  2. Behavioral recommendations:  3. Referral(s): [facility] (In Clinic) 4. "From scale of 1-10, how likely are you to follow plan?":   Bayard Hugger, LCSW

## 2019-06-28 ENCOUNTER — Other Ambulatory Visit: Payer: Self-pay

## 2019-06-28 ENCOUNTER — Ambulatory Visit (HOSPITAL_BASED_OUTPATIENT_CLINIC_OR_DEPARTMENT_OTHER): Payer: Self-pay | Admitting: Certified Registered"

## 2019-06-28 ENCOUNTER — Encounter (HOSPITAL_BASED_OUTPATIENT_CLINIC_OR_DEPARTMENT_OTHER): Payer: Self-pay | Admitting: Orthopaedic Surgery

## 2019-06-28 ENCOUNTER — Ambulatory Visit (HOSPITAL_BASED_OUTPATIENT_CLINIC_OR_DEPARTMENT_OTHER)
Admission: RE | Admit: 2019-06-28 | Discharge: 2019-06-28 | Disposition: A | Discharge status: Home / Self Care | Payer: Self-pay | Attending: Orthopaedic Surgery | Admitting: Orthopaedic Surgery

## 2019-06-28 ENCOUNTER — Encounter (HOSPITAL_BASED_OUTPATIENT_CLINIC_OR_DEPARTMENT_OTHER)
Admission: RE | Disposition: A | Discharge status: Home / Self Care | Payer: Self-pay | Source: Home / Self Care | Attending: Orthopaedic Surgery

## 2019-06-28 DIAGNOSIS — F17210 Nicotine dependence, cigarettes, uncomplicated: Secondary | ICD-10-CM | POA: Insufficient documentation

## 2019-06-28 DIAGNOSIS — S83242A Other tear of medial meniscus, current injury, left knee, initial encounter: Secondary | ICD-10-CM

## 2019-06-28 DIAGNOSIS — M1712 Unilateral primary osteoarthritis, left knee: Secondary | ICD-10-CM | POA: Insufficient documentation

## 2019-06-28 DIAGNOSIS — Z7982 Long term (current) use of aspirin: Secondary | ICD-10-CM | POA: Insufficient documentation

## 2019-06-28 DIAGNOSIS — Z79899 Other long term (current) drug therapy: Secondary | ICD-10-CM | POA: Insufficient documentation

## 2019-06-28 DIAGNOSIS — X58XXXA Exposure to other specified factors, initial encounter: Secondary | ICD-10-CM | POA: Insufficient documentation

## 2019-06-28 DIAGNOSIS — M94262 Chondromalacia, left knee: Secondary | ICD-10-CM | POA: Insufficient documentation

## 2019-06-28 DIAGNOSIS — S83242D Other tear of medial meniscus, current injury, left knee, subsequent encounter: Secondary | ICD-10-CM

## 2019-06-28 DIAGNOSIS — I10 Essential (primary) hypertension: Secondary | ICD-10-CM | POA: Insufficient documentation

## 2019-06-28 DIAGNOSIS — M25462 Effusion, left knee: Secondary | ICD-10-CM | POA: Insufficient documentation

## 2019-06-28 DIAGNOSIS — F329 Major depressive disorder, single episode, unspecified: Secondary | ICD-10-CM | POA: Insufficient documentation

## 2019-06-28 DIAGNOSIS — Z888 Allergy status to other drugs, medicaments and biological substances status: Secondary | ICD-10-CM | POA: Insufficient documentation

## 2019-06-28 DIAGNOSIS — M659 Synovitis and tenosynovitis, unspecified: Secondary | ICD-10-CM | POA: Insufficient documentation

## 2019-06-28 HISTORY — PX: KNEE ARTHROSCOPY: SHX127

## 2019-06-28 HISTORY — DX: Other tear of medial meniscus, current injury, unspecified knee, initial encounter: S83.249A

## 2019-06-28 HISTORY — DX: Unspecified osteoarthritis, unspecified site: M19.90

## 2019-06-28 SURGERY — ARTHROSCOPY, KNEE
Anesthesia: General | Site: Knee | Laterality: Left

## 2019-06-28 MED ORDER — MIDAZOLAM HCL 2 MG/2ML IJ SOLN: 1.0000 mg | INTRAMUSCULAR | Status: DC | PRN

## 2019-06-28 MED ORDER — LACTATED RINGERS IV SOLN: INTRAVENOUS | Status: DC

## 2019-06-28 MED ORDER — FENTANYL CITRATE (PF) 100 MCG/2ML IJ SOLN: 50.0000 ug | INTRAMUSCULAR | Status: DC | PRN

## 2019-06-28 MED ORDER — HYDROCODONE-ACETAMINOPHEN 5-325 MG PO TABS: 1.0000 | ORAL_TABLET | Freq: Four times a day (QID) | ORAL | 0 refills | Status: DC | PRN

## 2019-06-28 MED ORDER — LIDOCAINE HCL (CARDIAC) PF 100 MG/5ML IV SOSY
PREFILLED_SYRINGE | INTRAVENOUS | Status: DC | PRN
  Administered 2019-06-28: 60 mg via INTRAVENOUS

## 2019-06-28 MED ORDER — FENTANYL CITRATE (PF) 100 MCG/2ML IJ SOLN
INTRAMUSCULAR | Status: AC
  Filled 2019-06-28: qty 2

## 2019-06-28 MED ORDER — EPHEDRINE SULFATE-NACL 50-0.9 MG/10ML-% IV SOSY
PREFILLED_SYRINGE | INTRAVENOUS | Status: DC | PRN
  Administered 2019-06-28 (×2): 10 mg via INTRAVENOUS

## 2019-06-28 MED ORDER — FENTANYL CITRATE (PF) 100 MCG/2ML IJ SOLN
INTRAMUSCULAR | Status: DC | PRN
  Administered 2019-06-28: 50 ug via INTRAVENOUS

## 2019-06-28 MED ORDER — EPHEDRINE 5 MG/ML INJ
INTRAVENOUS | Status: AC
  Filled 2019-06-28: qty 10

## 2019-06-28 MED ORDER — MORPHINE SULFATE (PF) 4 MG/ML IV SOLN
INTRAVENOUS | Status: AC
  Filled 2019-06-28: qty 1

## 2019-06-28 MED ORDER — SODIUM CHLORIDE 0.9 % IR SOLN
Status: DC | PRN
  Administered 2019-06-28: 6000 mL

## 2019-06-28 MED ORDER — MIDAZOLAM HCL 5 MG/5ML IJ SOLN
INTRAMUSCULAR | Status: DC | PRN
  Administered 2019-06-28: 2 mg via INTRAVENOUS

## 2019-06-28 MED ORDER — PROMETHAZINE HCL 25 MG/ML IJ SOLN: 6.2500 mg | INTRAMUSCULAR | Status: DC | PRN

## 2019-06-28 MED ORDER — CEFAZOLIN SODIUM-DEXTROSE 2-4 GM/100ML-% IV SOLN
INTRAVENOUS | Status: AC
  Filled 2019-06-28: qty 100

## 2019-06-28 MED ORDER — MORPHINE SULFATE (PF) 4 MG/ML IV SOLN
INTRAVENOUS | Status: DC | PRN
  Administered 2019-06-28: 4 mg

## 2019-06-28 MED ORDER — MIDAZOLAM HCL 2 MG/2ML IJ SOLN
INTRAMUSCULAR | Status: AC
  Filled 2019-06-28: qty 2

## 2019-06-28 MED ORDER — DEXAMETHASONE SODIUM PHOSPHATE 4 MG/ML IJ SOLN
INTRAMUSCULAR | Status: DC | PRN
  Administered 2019-06-28: 5 mg via INTRAVENOUS

## 2019-06-28 MED ORDER — OXYCODONE HCL 5 MG/5ML PO SOLN: 5.0000 mg | Freq: Once | ORAL | Status: AC | PRN

## 2019-06-28 MED ORDER — ONDANSETRON HCL 4 MG/2ML IJ SOLN
INTRAMUSCULAR | Status: DC | PRN
  Administered 2019-06-28: 4 mg via INTRAVENOUS

## 2019-06-28 MED ORDER — PROPOFOL 10 MG/ML IV BOLUS
INTRAVENOUS | Status: DC | PRN
  Administered 2019-06-28: 50 mg via INTRAVENOUS
  Administered 2019-06-28: 150 mg via INTRAVENOUS

## 2019-06-28 MED ORDER — OXYCODONE HCL 5 MG PO TABS
5.0000 mg | ORAL_TABLET | Freq: Once | ORAL | Status: AC | PRN
  Administered 2019-06-28: 5 mg via ORAL

## 2019-06-28 MED ORDER — CEFAZOLIN SODIUM-DEXTROSE 2-4 GM/100ML-% IV SOLN
2.0000 g | INTRAVENOUS | Status: AC
  Administered 2019-06-28: 2 g via INTRAVENOUS

## 2019-06-28 MED ORDER — CHLORHEXIDINE GLUCONATE 4 % EX LIQD: 60.0000 mL | Freq: Once | CUTANEOUS | Status: DC

## 2019-06-28 MED ORDER — BUPIVACAINE HCL (PF) 0.25 % IJ SOLN
INTRAMUSCULAR | Status: DC | PRN
  Administered 2019-06-28 (×2): 10 mL

## 2019-06-28 MED ORDER — OXYCODONE HCL 5 MG PO TABS
ORAL_TABLET | ORAL | Status: AC
  Filled 2019-06-28: qty 1

## 2019-06-28 MED ORDER — FENTANYL CITRATE (PF) 100 MCG/2ML IJ SOLN: 25.0000 ug | INTRAMUSCULAR | Status: DC | PRN

## 2019-06-28 SURGICAL SUPPLY — 33 items
BLADE EXCALIBUR 4.0X13 (MISCELLANEOUS) IMPLANT
BNDG ELASTIC 6X5.8 VLCR STR LF (GAUZE/BANDAGES/DRESSINGS) ×2 IMPLANT
COVER WAND RF STERILE (DRAPES) IMPLANT
DISSECTOR  3.8MM X 13CM (MISCELLANEOUS)
DISSECTOR 3.8MM X 13CM (MISCELLANEOUS) IMPLANT
DRAPE ARTHROSCOPY W/POUCH 90 (DRAPES) ×2 IMPLANT
DRAPE U-SHAPE 47X51 STRL (DRAPES) ×2 IMPLANT
DRSG PAD ABDOMINAL 8X10 ST (GAUZE/BANDAGES/DRESSINGS) ×2 IMPLANT
DURAPREP 26ML APPLICATOR (WOUND CARE) ×2 IMPLANT
ELECT MENISCUS 165MM 90D (ELECTRODE) IMPLANT
ELECT REM PT RETURN 9FT ADLT (ELECTROSURGICAL)
ELECTRODE REM PT RTRN 9FT ADLT (ELECTROSURGICAL) IMPLANT
EXCALIBUR 3.8MM X 13CM (MISCELLANEOUS) ×2 IMPLANT
GAUZE SPONGE 4X4 12PLY STRL (GAUZE/BANDAGES/DRESSINGS) ×2 IMPLANT
GAUZE XEROFORM 1X8 LF (GAUZE/BANDAGES/DRESSINGS) ×2 IMPLANT
GLOVE BIOGEL PI IND STRL 8 (GLOVE) ×2 IMPLANT
GLOVE BIOGEL PI INDICATOR 8 (GLOVE) ×2
GLOVE ORTHO TXT STRL SZ7.5 (GLOVE) ×2 IMPLANT
GLOVE SURG ORTHO 8.0 STRL STRW (GLOVE) ×2 IMPLANT
GOWN STRL REUS W/ TWL LRG LVL3 (GOWN DISPOSABLE) ×2 IMPLANT
GOWN STRL REUS W/ TWL XL LVL3 (GOWN DISPOSABLE) ×1 IMPLANT
GOWN STRL REUS W/TWL LRG LVL3 (GOWN DISPOSABLE) ×2
GOWN STRL REUS W/TWL XL LVL3 (GOWN DISPOSABLE) ×1
KNEE WRAP E Z 3 GEL PACK (MISCELLANEOUS) ×2 IMPLANT
MANIFOLD NEPTUNE II (INSTRUMENTS) IMPLANT
PACK ARTHROSCOPY DSU (CUSTOM PROCEDURE TRAY) ×2 IMPLANT
PACK BASIN DAY SURGERY FS (CUSTOM PROCEDURE TRAY) ×2 IMPLANT
PADDING CAST COTTON 6X4 STRL (CAST SUPPLIES) ×2 IMPLANT
PENCIL SMOKE EVACUATOR (MISCELLANEOUS) IMPLANT
SUT ETHILON 3 0 PS 1 (SUTURE) ×2 IMPLANT
TOWEL GREEN STERILE FF (TOWEL DISPOSABLE) ×2 IMPLANT
TUBING ARTHROSCOPY IRRIG 16FT (MISCELLANEOUS) ×2 IMPLANT
WATER STERILE IRR 1000ML POUR (IV SOLUTION) ×2 IMPLANT

## 2019-06-28 NOTE — Transfer of Care (Signed)
Immediate Anesthesia Transfer of Care Note  Patient: Elaine Robinson  Procedure(s) Performed: LEFT KNEE ARTHROSCOPY WITH PARTIAL MEDIAL MENISCECTOMY (Left Knee)  Patient Location: PACU  Anesthesia Type:General  Level of Consciousness: awake  Airway & Oxygen Therapy: Patient Spontanous Breathing and Patient connected to face mask oxygen  Post-op Assessment: Report given to RN and Post -op Vital signs reviewed and stable  Post vital signs: Reviewed and stable  Last Vitals:  Vitals Value Taken Time  BP    Temp    Pulse 73 06/28/19 1148  Resp 47 06/28/19 1148  SpO2 100 % 06/28/19 1148  Vitals shown include unvalidated device data.  Last Pain:  Vitals:   06/28/19 0922  TempSrc: Oral  PainSc: 0-No pain      Patients Stated Pain Goal: 3 ([id number])  Complications: No apparent anesthesia complications

## 2019-06-28 NOTE — H&P (Signed)
Elaine Robinson is an 64 y.o. female.   Chief Complaint:  Left knee swelling, locking and catching HPI:   64 yo female with an acute injury to her left knee with locking and catching as well as swelling.  After a failure of conservative treatment, a MRI was obtained showing a large left medial meniscal tear.  Surgery has been recommended at this point given her continued swelling and mechanical symptoms.  Past Medical History:  Diagnosis Date  . Arthritis    hands left knee  . Depression   . Hypertension   . MMT (medial meniscus tear)    left knee    Past Surgical History:  Procedure Laterality Date  . ABDOMINAL HYSTERECTOMY    . BREAST BIOPSY Right 05/03/2012   stereo bx    History reviewed. No pertinent family history. Social History:  reports that she has been smoking cigarettes. She has a 11.25 pack-year smoking history. She has never used smokeless tobacco. She reports previous alcohol use. She reports previous drug use.  Allergies:  Allergies  Allergen Reactions  . Lisinopril Cough    Medications Prior to Admission  Medication Sig Dispense Refill  . amLODipine (NORVASC) 5 MG tablet Take 1 tablet (5 mg total) by mouth daily. 30 tablet 3  . aspirin 81 MG chewable tablet Chew by mouth daily.    . sertraline (ZOLOFT) 100 MG tablet Take 1 tablet (100 mg total) by mouth daily. 30 tablet 2  . simvastatin (ZOCOR) 10 MG tablet Take 1 tablet (10 mg total) by mouth every evening. 30 tablet 3  . valACYclovir (VALTREX) 1000 MG tablet Take 0.5 tablets (500 mg total) by mouth daily. 30 tablet 11    Results for orders placed or performed during the hospital encounter of 06/26/19 (from the past 48 hour(s))  SARS CORONAVIRUS 2 (TAT 6-24 HRS) Nasopharyngeal Nasopharyngeal Swab     Status: None   Collection Time: 06/26/19 10:05 AM   Specimen: Nasopharyngeal Swab  Result Value Ref Range   SARS Coronavirus 2 NEGATIVE NEGATIVE    Comment: (NOTE) SARS-CoV-2 target nucleic acids are NOT  DETECTED. The SARS-CoV-2 RNA is generally detectable in upper and lower respiratory specimens during the acute phase of infection. Negative results do not preclude SARS-CoV-2 infection, do not rule out co-infections with other pathogens, and should not be used as the sole basis for treatment or other patient management decisions. Negative results must be combined with clinical observations, patient history, and epidemiological information. The expected result is Negative. Fact Sheet for Patients: SugarRoll.be Fact Sheet for Healthcare Providers: https://www.woods-mathews.com/ This test is not yet approved or cleared by the [place] FDA and  has been authorized for detection and/or diagnosis of SARS-CoV-2 by FDA under an Emergency Use Authorization (EUA). This EUA will remain  in effect (meaning this test can be used) for the duration of the COVID-19 declaration under Section 56 4(b)(1) of the Act, 21 U.S.C. section 360bbb-3(b)(1), unless the authorization is terminated or revoked sooner. Performed at [facility], [place] [address], [place], [place] [address]    No results found.  Review of Systems  Musculoskeletal: Positive for joint swelling.  All other systems reviewed and are negative.   Blood pressure 107/75, pulse 71, temperature (!) 97.1 F (36.2 C), temperature source Oral, height 5\' 6"  (1.676 m), weight 87.3 kg, SpO2 100 %. Physical Exam  Constitutional: She is oriented to person, place, and time. She appears well-developed and well-nourished.  HENT:  Head: Normocephalic and atraumatic.  Eyes: Pupils are equal, round, and reactive to light. EOM are normal.  Cardiovascular: Normal rate.  Respiratory: Effort normal.  GI: Soft.  Musculoskeletal:     Cervical back: Normal range of motion and neck supple.     Left knee: Swelling and effusion present. Decreased range of motion. Tenderness present over the medial joint line.  Abnormal meniscus.  Neurological: She is alert and oriented to person, place, and time.  Skin: Skin is warm and dry.  Psychiatric: She has a normal mood and affect.     Assessment/Plan Left knee with complex medial meniscal tear  To the OR today as an outpatient for a left knee arthroscopy.  The risks and benefits of surgery have been discussed in detail and informed consent is obtained.  Mcarthur Rossetti, MD 06/28/2019, 9:48 AM

## 2019-06-28 NOTE — Op Note (Signed)
Elaine Robinson, [place] MEDICAL RECORD [id number] ACCOUNT [id number] DATE OF BIRTH:[birth year] FACILITY: [facility] LOCATION: [facility] PHYSICIAN:Taraann Olthoff [place], MD  OPERATIVE REPORT  DATE OF PROCEDURE:  06/28/2019  PREOPERATIVE DIAGNOSIS:  Left knee complex medial meniscal tear.  POSTOPERATIVE DIAGNOSES: 1.  Left knee complex medial meniscal tear. 2.  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 3.  Left knee synovitis with effusion.  PROCEDURES:   1.  Left knee arthroscopy with debridement. 2.  Partial medial meniscectomy.  SURGEON:  Lind Guest.  Ninfa [name], MD  ASSISTANT:  Erskine Emery, PA-C.  ANESTHESIA:   1.  General. 2.  Local with 0.25% plain Marcaine mixed with morphine.  ESTIMATED BLOOD LOSS:  Minimal.  COMPLICATIONS:  None.  INDICATIONS:  The patient is a 64 year old female with recurrent effusion of her left knee.  This has only been going on for a short amount of time.  She has been having locking and catching as well.  I have aspirated the knee on numerous occasions in  the office and placed a steroid injection.  After the effusion kept becoming recurrent, we sent her off for a left knee MRI.  The left knee MRI did show some thinning of the cartilage in her knee, especially the medial compartment, but a complex  posterior horn to midbody medial meniscal tear.  At this point, with the recurrent effusion, we have recommended arthroscopic intervention.  She still has mechanical symptoms and pain.  We described in detail what the surgery involves.  We talked about  the risks and benefits of surgery as well and she does wish to proceed.  DESCRIPTION OF PROCEDURE:  After informed consent was obtained and appropriate left knee was marked, she was brought to the operating room and placed supine on the operating table.  General anesthesia was then obtained.  Her left thigh, knee, leg, ankle,  foot were prepped and draped with DuraPrep and sterile drapes,  including a sterile stockinette.  With the bed raised and the lateral leg post was utilized and the left knee was flexed off the side table.  A timeout was called and she was identified as  the correct patient and the correct left knee.  I then made an anterolateral arthroscopy portal and inserted a cannula in the knee and drained a very large effusion from her knee.  I then placed the camera into the knee and went to the medial  compartment.  I made an anteromedial incision.  I was able to probe the medial meniscus and found a complex posterior horn to midbody tear.  There were areas of grade III chondromalacia in a wide area of the medial femoral condyle.  Using arthroscopic  shaver and basket forceps, we were able to perform a partial medial meniscectomy, taking this back to a stable margin and there were no posterior meniscal fragments subluxating into the joint itself.  I then smoothed out the cartilage of the medial  femoral condyle.  We found significant synovitis in the suprapatellar space which we debrided.  The ACL and PCL were intact and the lateral compartment showed no significant degenerative changes.  I put the knee through flexion and extension and the ACL  did not show any signs of impingement or laxity throughout its flexion, extension arc.  Of note, her clinical exam does not show any ligamentous laxity either.  Once we performed the arthroscopic portion of the case, we removed all the fluid from the  knee and removed all instrumentation.  We then closed the  portal sites with interrupted nylon suture.  Xeroform, well-padded sterile dressing was applied.  We did place Marcaine into the knee with a mixture of Marcaine and morphine, as well as the  arthroscopy portal sites.  After a well-padded sterile dressing was applied, she was awakened, extubated, and taken to recovery room in stable condition.  All final counts were correct.  There were no complications noted.  Postoperatively, we will  allow  weightbearing as tolerated and follow up in the office in a week.  We will give discharge instructions for weightbearing as well as wound care.  Pain medications will be sent in as well.  VN/NUANCE  D:06/28/2019 T:06/28/2019 JOB:[id number]

## 2019-06-28 NOTE — Brief Op Note (Signed)
06/28/2019  11:39 AM  PATIENT:  Elaine Robinson  64 y.o. female  PRE-OPERATIVE DIAGNOSIS:  left knee medial meniscal tear  POST-OPERATIVE DIAGNOSIS:  left knee medial meniscal tear  PROCEDURE:  Procedure(s): LEFT KNEE ARTHROSCOPY WITH PARTIAL MEDIAL MENISCECTOMY (Left)  SURGEON:  Surgeon(s) and Role:    Mcarthur Rossetti, MD - Primary  PHYSICIAN ASSISTANT: Benita Stabile, PA-C  ANESTHESIA:   local and general  EBL:  3 mL   COUNTS:  YES  DICTATION: .Other Dictation: Dictation Number [id number]  PLAN OF CARE: Discharge to home after PACU  PATIENT DISPOSITION:  PACU - hemodynamically stable.   Delay start of Pharmacological VTE agent (>24hrs) due to surgical blood loss or risk of bleeding: no

## 2019-06-28 NOTE — Anesthesia Procedure Notes (Signed)
Procedure Name: LMA Insertion Date/Time: 06/28/2019 11:07 AM Performed by: Lieutenant Diego, CRNA Pre-anesthesia Checklist: Patient identified, Emergency Drugs available, Suction available and Patient being monitored Patient Re-evaluated:Patient Re-evaluated prior to induction Oxygen Delivery Method: Circle system utilized Preoxygenation: Pre-oxygenation with 100% oxygen Induction Type: IV induction Ventilation: Mask ventilation without difficulty LMA: LMA inserted LMA Size: 4.0 Number of attempts: 1 Airway Equipment and Method: Bite block Placement Confirmation: positive ETCO2 and breath sounds checked- equal and bilateral Tube secured with: Tape Dental Injury: Teeth and Oropharynx as per pre-operative assessment

## 2019-06-28 NOTE — Discharge Instructions (Signed)
You had 5 mg of Oxycodone at 12:35 pm day of surgery   You may increase your activity as comfort allows. You may put all of your weight on your left leg/knee as comfort allows. Expect swelling - ice and elevation as needed. You can remove your dressings tomorrow and get your incisions wet daily in the shower. Place band-aids over your incisions daily. Expect swelling and bloody drainage.   Post Anesthesia Home Care Instructions  Activity: Get plenty of rest for the remainder of the day. A responsible individual must stay with you for 24 hours following the procedure.  For the next 24 hours, DO NOT: -Drive a car -Paediatric nurse -Drink alcoholic beverages -Take any medication unless instructed by your physician -Make any legal decisions or sign important papers.  Meals: Start with liquid foods such as gelatin or soup. Progress to regular foods as tolerated. Avoid greasy, spicy, heavy foods. If nausea and/or vomiting occur, drink only clear liquids until the nausea and/or vomiting subsides. Call your physician if vomiting continues.  Special Instructions/Symptoms: Your throat may feel dry or sore from the anesthesia or the breathing tube placed in your throat during surgery. If this causes discomfort, gargle with warm salt water. The discomfort should disappear within 24 hours.  If you had a scopolamine patch placed behind your ear for the management of post- operative nausea and/or vomiting:  1. The medication in the patch is effective for 72 hours, after which it should be removed.  Wrap patch in a tissue and discard in the trash. Wash hands thoroughly with soap and water. 2. You may remove the patch earlier than 72 hours if you experience unpleasant side effects which may include dry mouth, dizziness or visual disturbances. 3. Avoid touching the patch. Wash your hands with soap and water after contact with the patch.

## 2019-06-28 NOTE — Anesthesia Postprocedure Evaluation (Signed)
Anesthesia Post Note  Patient: Elaine Robinson  Procedure(s) Performed: LEFT KNEE ARTHROSCOPY WITH PARTIAL MEDIAL MENISCECTOMY (Left Knee)     Patient location during evaluation: PACU Anesthesia Type: General Level of consciousness: awake and alert Pain management: pain level controlled Vital Signs Assessment: post-procedure vital signs reviewed and stable Respiratory status: spontaneous breathing, nonlabored ventilation and respiratory function stable Cardiovascular status: blood pressure returned to baseline and stable Postop Assessment: no apparent nausea or vomiting Anesthetic complications: no    Last Vitals:  Vitals:   06/28/19 1215 06/28/19 1245  BP: 112/76 108/86  Pulse: 72 69  Resp: 19 18  Temp:  36.6 C  SpO2: 99% 96%    Last Pain:  Vitals:   06/28/19 1245  TempSrc:   PainSc: [place]

## 2019-06-28 NOTE — Anesthesia Preprocedure Evaluation (Addendum)
Anesthesia Evaluation  Patient identified by MRN, date of birth, ID band Patient awake    Reviewed: Allergy & Precautions, NPO status , Patient's Chart, lab work & pertinent test results  History of Anesthesia Complications Negative for: history of anesthetic complications  Airway Mallampati: I  TM Distance: >3 FB Neck ROM: Full    Dental  (+) Dental Advisory Given, Edentulous Upper   Pulmonary Current SmokerPatient did not abstain from smoking.,    Pulmonary exam normal        Cardiovascular hypertension, Pt. on medications (-) anginaNormal cardiovascular exam     Neuro/Psych PSYCHIATRIC DISORDERS Depression negative neurological ROS     GI/Hepatic negative GI ROS, Neg liver ROS,   Endo/Other   Obesity   Renal/GU negative Renal ROS     Musculoskeletal  (+) Arthritis ,   Abdominal   Peds  Hematology negative hematology ROS (+)   Anesthesia Other Findings Covid neg 06/26/19   Reproductive/Obstetrics                            Anesthesia Physical Anesthesia Plan  ASA: II  Anesthesia Plan: General   Post-op Pain Management:    Induction: Intravenous  PONV Risk Score and Plan: 3 and Treatment may vary due to age or medical condition, Ondansetron and Dexamethasone  Airway Management Planned: LMA  Additional Equipment: None  Intra-op Plan:   Post-operative Plan: Extubation in OR  Informed Consent: I have reviewed the patients History and Physical, chart, labs and discussed the procedure including the risks, benefits and alternatives for the proposed anesthesia with the patient or authorized representative who has indicated his/her understanding and acceptance.     Dental advisory given  Plan Discussed with: CRNA and Anesthesiologist  Anesthesia Plan Comments:        Anesthesia Quick Evaluation

## 2019-07-05 ENCOUNTER — Inpatient Hospital Stay: Payer: Self-pay | Admitting: Physician Assistant

## 2019-07-09 ENCOUNTER — Ambulatory Visit (INDEPENDENT_AMBULATORY_CARE_PROVIDER_SITE_OTHER): Payer: Self-pay | Admitting: Physician Assistant

## 2019-07-09 ENCOUNTER — Encounter: Payer: Self-pay | Admitting: Physician Assistant

## 2019-07-09 ENCOUNTER — Other Ambulatory Visit: Payer: Self-pay

## 2019-07-09 DIAGNOSIS — Z9889 Other specified postprocedural states: Secondary | ICD-10-CM

## 2019-07-09 NOTE — Progress Notes (Signed)
HPI: Elaine Robinson returns today 1 week status post left knee arthroscopy.  She was found to have complex medial meniscal tear and grade III chondromalacia medial femoral condyle.  Lateral and patellofemoral joint is well-preserved.  Physical exam: Left knee no abnormal warmth erythema.  She does have an ecchymosis around the medial port site.  Calf supple nontender.  Good range of motion of the knee.  Impression: 1 week status post left knee arthroscopy with  Plan: We will have her work on range of motion and quad strengthening.  Sutures were removed from the port sites.  She will work on scar tissue mobilization.  Follow-up with [place] in 1 month sooner if there is any questions concerns.  Arthroscopy pictures were reviewed with the patient today.

## 2019-07-10 ENCOUNTER — Ambulatory Visit: Payer: Self-pay | Admitting: Licensed Clinical Social Worker

## 2019-07-10 ENCOUNTER — Encounter: Payer: Self-pay | Admitting: Licensed Clinical Social Worker

## 2019-07-10 DIAGNOSIS — F331 Major depressive disorder, recurrent, moderate: Secondary | ICD-10-CM

## 2019-07-10 DIAGNOSIS — F411 Generalized anxiety disorder: Secondary | ICD-10-CM

## 2019-07-10 NOTE — BH Specialist Note (Signed)
Integrated Behavioral Health Follow Up Visit Via Phone  MRN: [id number] Name: Elaine Robinson  Type of Service: [place] Interpretor:No. Interpretor Name and Language: not applicable.   SUBJECTIVE: Elaine Robinson is a 64 y.o. female accompanied by herself. Patient was referred by Carlyon Shadow NP for mental health.  Patient reports the following symptoms/concerns: She explains that she got her stitches from her knee surgery yesterday. She notes that her knee still hurts some and is hoping that the shape regains its form. She notes that she goes back in a month for a follow up. She explains that she tries to take advantage of getting out of her apartment at any opportunity. She discussed family dynamics. She notes that she has not smoked a cigarette since March 8th. She notes that she has been doing a lot of snacking since she stopped smoking. She notes that she has not been able to feed the homeless lately with the procedure on her knee and everything. She denies suicidal and homicidal thoughts.  Duration of problem: ; Severity of problem: mild  OBJECTIVE: Mood: Euthymic and Affect: Appropriate Risk of harm to self or others: No plan to harm self or others  LIFE CONTEXT: Family and Social: see above. School/Work: see above. Self-Care: see above.  Life Changes: see above.  GOALS ADDRESSED: Patient will: 1.  Reduce symptoms of: depression  2.  Increase knowledge and/or ability of: self-management skills and stress reduction  3.  Demonstrate ability to: Increase healthy adjustment to current life circumstances  INTERVENTIONS: Interventions utilized:  Supportive Counseling was utilized by the clinician during today's follow up session. Clinician processed with the patient regarding how she has been doing since the last follow up session. Clinician measured the patient's anxiety and depression on a numerical scale. Clinician explained to the patient  that it sounds like the procedure on her knee went well. Clinician explained to the patient that it will take time for her knee to heal and her body to recuperate. Clinician discussed with the patient if she is keeping herself busy. Clinician encouraged the patient to practice self care and utilize her coping skills. Standardized Assessments completed: GAD-7 & PHQ 9  ASSESSMENT: Patient currently experiencing see above.   Patient may benefit from see above.  PLAN: 1. Follow up with behavioral health clinician on : two to three weeks or earlier if needed.  2. Behavioral recommendations: see above. 3. Referral(s): [facility] (In Clinic) 4. "From scale of 1-10, how likely are you to follow plan?":   Bayard Hugger, LCSW

## 2019-07-30 ENCOUNTER — Telehealth: Payer: Self-pay | Admitting: Pharmacy Technician

## 2019-07-30 NOTE — Telephone Encounter (Signed)
Received updated proof of income.  Patient eligible to receive medication assistance at [facility] until time for re-certification in 9359, and as long as eligibility requirements continue to be met.  [facility]

## 2019-07-31 ENCOUNTER — Other Ambulatory Visit: Payer: Self-pay

## 2019-07-31 ENCOUNTER — Ambulatory Visit: Payer: Self-pay | Admitting: Licensed Clinical Social Worker

## 2019-07-31 DIAGNOSIS — F411 Generalized anxiety disorder: Secondary | ICD-10-CM

## 2019-07-31 DIAGNOSIS — F331 Major depressive disorder, recurrent, moderate: Secondary | ICD-10-CM

## 2019-07-31 NOTE — BH Specialist Note (Signed)
Integrated Behavioral Health Follow Up  Via Phone  MRN: [id number] Name: Elaine Robinson  Type of Service: [place] Interpretor:No. Interpretor Name and Language: not applicable.   SUBJECTIVE: Elaine Robinson is a 64 y.o. female accompanied by herself. Patient was referred by  Patient reports the following symptoms/concerns: She notes that she is working on getting her driver's license back. She notes that she has to take 10 classes and pay 40.00 for each class before she can get her license back over a four to five week period. She notes that she is hoping if she gets her license back that she can get out of the house more and improve her overall mood. She notes that she has been walking some and stayed her daughter's house the past weekend. She notes that she makes it to church most Sunday's. She notes that she has not been feeding the homeless lately because she has been preoccupied with getting her license back and helping out with family. She asked about an eye doctor and requested an eye exam. She denies suicidal and homicidal thoughts.  Duration of problem: ; Severity of problem: mild  OBJECTIVE: Mood: Euthymic and Affect: Appropriate Risk of harm to self or others: No plan to harm self or others  LIFE CONTEXT: Family and Social: see above. School/Work: see above. Self-Care: see above. Life Changes: see above.   GOALS ADDRESSED: Patient will: 1.  Reduce symptoms of: stress  2.  Increase knowledge and/or ability of: coping skills and self-management skills  3.  Demonstrate ability to: Increase healthy adjustment to current life circumstances  INTERVENTIONS: Interventions utilized:  Supportive Counseling ws utilized by the clinician during today's follow up session. Clinician processed with the patient regarding how she has been doing since the last follow up session. Clinician measured the patient's anxiety and depression on a numerical scale.  Clinician congratulated the patient on taking the steps to getting her driver's license back. Clinician explained to the patient that she agrees with her that having her driver's license will give her more independence and improve her overall quality of life. Clinician explained to the patient that at this time she believes that patients are being referred out to [facility] for an eye exam or suggested that they go to [facility]. Clinician provided collateral information regarding about the new eyes for the needy.  Standardized Assessments completed: GAD-7 and PHQ 9  ASSESSMENT: Patient currently experiencing see above.   Patient may benefit from see above.  PLAN: 1. Follow up with behavioral health clinician on :  2. Behavioral recommendations:  3. Referral(s): [place] (In Clinic) 4. "From scale of 1-10, how likely are you to follow plan?":   Bayard Hugger, LCSW

## 2019-08-01 ENCOUNTER — Other Ambulatory Visit: Payer: Self-pay

## 2019-08-01 MED ORDER — SERTRALINE HCL 100 MG PO TABS: 100.0000 mg | ORAL_TABLET | Freq: Every day | ORAL | 2 refills | Status: DC

## 2019-08-06 ENCOUNTER — Other Ambulatory Visit: Payer: Self-pay

## 2019-08-06 ENCOUNTER — Encounter: Payer: Self-pay | Admitting: Physician Assistant

## 2019-08-06 ENCOUNTER — Ambulatory Visit (INDEPENDENT_AMBULATORY_CARE_PROVIDER_SITE_OTHER): Payer: Self-pay | Admitting: Physician Assistant

## 2019-08-06 DIAGNOSIS — Z9889 Other specified postprocedural states: Secondary | ICD-10-CM

## 2019-08-06 MED ORDER — LIDOCAINE HCL 1 % IJ SOLN
5.0000 mL | INTRAMUSCULAR | Status: AC | PRN
  Administered 2019-08-06: 5 mL

## 2019-08-06 MED ORDER — METHYLPREDNISOLONE ACETATE 40 MG/ML IJ SUSP
40.0000 mg | INTRAMUSCULAR | Status: AC | PRN
  Administered 2019-08-06: 40 mg via INTRA_ARTICULAR

## 2019-08-06 NOTE — Progress Notes (Signed)
HPI [name] returns today status post left knee arthroscopy with partial medial meniscectomy she is now 5 weeks 4 days postop.  States overall the knee is doing well however she has had some swelling.  She is concerned about the shape and size of the knee.  She is wondering if the knee will go back to looking like her other knee.  She has occasional popping in the knee and stiffness but overall she feels knee is improved since undergoing surgery.    HPI [name] returns today status post left knee arthroscopy with partial medial meniscectomy she is now 5 weeks 4 days postop.  States overall the knee is doing well however she has had some swelling.  She is concerned about the shape and size of the knee.  She is wondering if the knee will go back to looking like her other knee.  She has occasional popping in the knee and stiffness but overall she feels knee is improved since undergoing surgery.  Physical exam left knee good range of motion.  Positive effusion no abnormal warmth erythema.  Port sites well-healed.  Calf supple nontender.   Procedure Note  Patient: Elaine Robinson             Date of Birth: 1955/07/10           MRN: [id number]             Visit Date: 08/06/2019  Procedures: Visit Diagnoses:  1. S/P left knee arthroscopy     Large Joint Inj on 08/06/2019 4:24 PM Indications: pain Details: 22 G 1.5 in needle, anterolateral approach  Arthrogram: No  Medications: 40 mg methylPREDNISolone acetate 40 MG/ML; 5 mL lidocaine 1 % Aspirate: 51 mL yellow and blood-tinged Outcome: tolerated well, no immediate complications Procedure, treatment alternatives, risks and benefits explained, specific risks discussed. Consent was given by the patient. Immediately prior to procedure a time out was called to verify the correct patient, procedure, equipment, support staff and site/side marked as required. Patient was prepped and draped in the usual sterile fashion.    Plan: She will  continue to work on range of motion and strengthening the knee.  Discussed with her that we will take time for the swelling to a good totally resolve.  We will see her back in 1 month to see how she is doing overall.  Questions were encouraged and answered

## 2019-08-16 ENCOUNTER — Ambulatory Visit: Payer: Self-pay | Admitting: Gerontology

## 2019-08-16 ENCOUNTER — Encounter: Payer: Self-pay | Admitting: Gerontology

## 2019-08-16 ENCOUNTER — Other Ambulatory Visit: Payer: Self-pay

## 2019-08-16 VITALS — BP 142/89 | HR 77 | Temp 96.6°F | Ht 65.0 in | Wt 193.0 lb

## 2019-08-16 DIAGNOSIS — Z0000 Encounter for general adult medical examination without abnormal findings: Secondary | ICD-10-CM

## 2019-08-16 DIAGNOSIS — I10 Essential (primary) hypertension: Secondary | ICD-10-CM

## 2019-08-16 DIAGNOSIS — Z8619 Personal history of other infectious and parasitic diseases: Secondary | ICD-10-CM

## 2019-08-16 DIAGNOSIS — N644 Mastodynia: Secondary | ICD-10-CM | POA: Insufficient documentation

## 2019-08-16 DIAGNOSIS — H538 Other visual disturbances: Secondary | ICD-10-CM

## 2019-08-16 NOTE — Patient Instructions (Signed)

## 2019-08-16 NOTE — Progress Notes (Signed)
Established Patient Office Visit  Subjective:  Patient ID: Elaine Robinson, female    DOB: [birth year]  Age: 64 y.o. MRN: [id number]  CC:  Chief Complaint  Patient presents with  . Hypertension    HPI Elaine Robinson presents for follow up of Hypertension. She states that she's compliant with her medications and adheres to [place]. She has left knee Arthroscopy with partial medial menisectomy on 06/28/2019 done by Dr Ninfa [name] C.Y. She denies pain or swelling to left knee. She requests for an eye exam because she's unable to read small prints, but denies eye pain. She also c/o having 3 herpes outbreak for the past 3 months and she takes 1000 mg of Valcyclovir daily. She c/o experiencing intermittent non radiating shooting pain to lateral aspect of right breast. She states that pain resolves within 3 seconds, and also denies pain to breast when it's touched. She states that she had 2 episodes of the pain this year and her last episode was 4 days ago. She had Mammogram and breast ultrasound on 01/25/2019, it showed Right breast ultrasound was performed demonstrating a 1.1 x 0.7 x 0.5 cm oval circumscribed hypoechoic mass retroareolar location 12 o'clock. This corresponds with the mammographically stable retroareolar right breast mass, most compatible with a benign fibroadenoma per Dr Lovey Newcomer. Overall, she's doing well, stating that she quit smoking 2 months ago, and she offers no further complaint.      Past Medical History:  Diagnosis Date  . Arthritis    hands left knee  . Depression   . Hypertension   . MMT (medial meniscus tear)    left knee    Past Surgical History:  Procedure Laterality Date  . ABDOMINAL HYSTERECTOMY    . BREAST BIOPSY Right 05/03/2012   stereo bx  . KNEE ARTHROSCOPY Left 06/28/2019   Procedure: LEFT KNEE ARTHROSCOPY WITH PARTIAL MEDIAL MENISCECTOMY;  Surgeon: Mcarthur Rossetti, MD;  Location: [facility];  Service: Orthopedics;  Laterality:  Left;    No family history on file.  Social History   Socioeconomic History  . Marital status: Widowed    Spouse name: Not on file  . Number of children: 1  . Years of education: 11th grade  . Highest education level: 11th grade  Occupational History  . Not on file  Tobacco Use  . Smoking status: Current Every Day Smoker    Packs/day: 0.00    Years: 45.00    Pack years: 0.00    Types: Cigarettes  . Smokeless tobacco: Never Used  Substance and Sexual Activity  . Alcohol use: Not Currently  . Drug use: Not Currently  . Sexual activity: Not Currently    Birth control/protection: None, Surgical  Other Topics Concern  . Not on file  Social History Narrative   Social Determinants screening completed on 07/10/2019. Patient does not require a referral to [facility] at this point in time.    Social Determinants of Health   Financial Resource Strain: Low Risk   . Difficulty of Paying Living Expenses: Not very hard  Food Insecurity: No Food Insecurity  . Worried About Charity fundraiser in the Last Year: Never true  . Ran Out of Food in the Last Year: Never true  Transportation Needs: No Transportation Needs  . Lack of Transportation (Medical): No  . Lack of Transportation (Non-Medical): No  Physical Activity: Sufficiently Active  . Days of Exercise per Week: 5 days  . Minutes of Exercise per Session: 60  min  Stress: Stress Concern Present  . Feeling of Stress : To some extent  Social Connections: Slightly Isolated  . Frequency of Communication with Friends and Family: More than three times a week  . Frequency of Social Gatherings with Friends and Family: More than three times a week  . Attends Religious Services: More than 4 times per year  . Active Member of Clubs or Organizations: Yes  . Attends Archivist Meetings: More than 4 times per year  . Marital Status: Widowed  Intimate Partner Violence: Not At Risk  . Fear of Current or Ex-Partner: No  . Emotionally  Abused: No  . Physically Abused: No  . Sexually Abused: No    Outpatient Medications Prior to Visit  Medication Sig Dispense Refill  . amLODipine (NORVASC) 5 MG tablet Take 1 tablet (5 mg total) by mouth daily. 30 tablet 3  . aspirin 81 MG chewable tablet Chew by mouth daily.    . sertraline (ZOLOFT) 100 MG tablet Take 1 tablet (100 mg total) by mouth daily. 30 tablet 2  . simvastatin (ZOCOR) 10 MG tablet Take 1 tablet (10 mg total) by mouth every evening. 30 tablet 3  . valACYclovir (VALTREX) 1000 MG tablet Take 0.5 tablets (500 mg total) by mouth daily. 30 tablet 11  . HYDROcodone-acetaminophen (NORCO/VICODIN) 5-325 MG tablet Take 1-2 tablets by mouth every 6 (six) hours as needed for moderate pain. 30 tablet 0   No facility-administered medications prior to visit.    Allergies  Allergen Reactions  . Lisinopril Cough    ROS Review of Systems  Constitutional: Negative.   Respiratory: Negative.   Cardiovascular: Negative.   Musculoskeletal: Negative.   Neurological: Negative.   Psychiatric/Behavioral: Negative.       Objective:    Physical Exam  Constitutional: She is oriented to person, place, and time. She appears well-developed.  HENT:  Head: Normocephalic.  Eyes: Pupils are equal, round, and reactive to light. EOM are normal.  Cardiovascular: Normal rate and regular rhythm.  Pulmonary/Chest: Effort normal and breath sounds normal.  Musculoskeletal:        General: Normal range of motion.  Neurological: She is alert and oriented to person, place, and time.  Psychiatric: She has a normal mood and affect. Her behavior is normal. Judgment and thought content normal.    BP (!) 142/89 (BP Location: Left Arm, Patient Position: Sitting)   Pulse 77   Temp (!) 96.6 F (35.9 C)   Ht 5' 5"  (1.651 m)   Wt 193 lb (87.5 kg)   SpO2 98%   BMI 32.12 kg/m  Wt Readings from Last 3 Encounters:  08/16/19 193 lb (87.5 kg)  06/28/19 192 lb 7.4 oz (87.3 kg)  04/18/19 196 lb  (88.9 kg)   She was encouraged to continue on a weight loss regimen.  Health Maintenance Due  Topic Date Due  . Hepatitis C Screening  Never done  . HIV Screening  Never done  . TETANUS/TDAP  Never done  . PAP SMEAR-Modifier  Never done  . COLONOSCOPY  Never done    There are no preventive care reminders to display for this patient.  Lab Results  Component Value Date   TSH 1.940 12/27/2018   Lab Results  Component Value Date   WBC 4.7 12/27/2018   HGB 12.1 12/27/2018   HCT 36.5 12/27/2018   MCV 86 12/27/2018   PLT 302 12/27/2018   Lab Results  Component Value Date   NA 142 12/27/2018  K 4.4 12/27/2018   CO2 22 12/27/2018   GLUCOSE 98 12/27/2018   BUN 12 12/27/2018   CREATININE 0.72 12/27/2018   BILITOT 0.4 12/27/2018   ALKPHOS 73 12/27/2018   AST 15 12/27/2018   ALT 9 12/27/2018   PROT 7.4 12/27/2018   ALBUMIN 4.5 12/27/2018   CALCIUM 9.5 12/27/2018   Lab Results  Component Value Date   CHOL 134 12/27/2018   Lab Results  Component Value Date   HDL 46 12/27/2018   Lab Results  Component Value Date   LDLCALC 74 12/27/2018   Lab Results  Component Value Date   TRIG 69 12/27/2018   Lab Results  Component Value Date   CHOLHDL 2.9 12/27/2018   Lab Results  Component Value Date   HGBA1C 5.7 (H) 03/28/2019      Assessment & Plan:    1. Essential hypertension - Her blood pressure is under control, her goal is less than 150/90. She will continue on current treatment regimen, DASH diet and exercise as tolerated.  2. History of herpes genitalis - She was provided with [facility] Department information to schedule a follow up appointment, but to continue on 1000 mg Valtrex.  3. Visual blurriness  - Ambulatory referral to Ophthalmology  4. Pain of right breast - No Tenderness with palpation, she was advised to notify clinic with worsening symptoms and to keep her Mammogram appointment on 01/09/2020.  5. Healthcare maintenance  -  Ambulatory referral to Gastroenterology for Colonoscopy screening.  - Comp Met (CMET); Future - Lipid panel; Future - HgB A1c; Future - CBC w/Diff; Future    Follow-up: Return in about 3 months (around 11/14/2019), or if symptoms worsen or fail to improve.    Keevon Henney Jerold Coombe, NP

## 2019-08-21 ENCOUNTER — Ambulatory Visit: Payer: Self-pay | Admitting: Licensed Clinical Social Worker

## 2019-08-27 ENCOUNTER — Other Ambulatory Visit: Payer: Self-pay

## 2019-08-27 ENCOUNTER — Telehealth (INDEPENDENT_AMBULATORY_CARE_PROVIDER_SITE_OTHER): Payer: Self-pay | Admitting: Gastroenterology

## 2019-08-27 DIAGNOSIS — Z1211 Encounter for screening for malignant neoplasm of colon: Secondary | ICD-10-CM

## 2019-08-27 NOTE — Progress Notes (Signed)
Gastroenterology Pre-Procedure Review  Request Date: Friday 09/21/19 Requesting Physician: Dr. Vicente Males  PATIENT REVIEW QUESTIONS: The patient responded to the following health history questions as indicated:    1. Are you having any GI issues? no constipation some times 2. Do you have a personal history of Polyps? no 3. Do you have a family history of Colon Cancer or Polyps? yes (maternal grandmother colon cancer) 4. Diabetes Mellitus? no 5. Joint replacements in the past 12 months?torn meniscus 07/08/19 6. Major health problems in the past 3 months?no 7. Any artificial heart valves, MVP, or defibrillator?no    MEDICATIONS & ALLERGIES:    Patient reports the following regarding taking any anticoagulation/antiplatelet therapy:   Plavix, Coumadin, Eliquis, Xarelto, Lovenox, Pradaxa, Brilinta, or Effient? no Aspirin? Yes 81 mg daily  Patient confirms/reports the following medications:  Current Outpatient Medications  Medication Sig Dispense Refill  . amLODipine (NORVASC) 5 MG tablet Take 1 tablet (5 mg total) by mouth daily. 30 tablet 3  . aspirin 81 MG chewable tablet Chew by mouth daily.    . sertraline (ZOLOFT) 100 MG tablet Take 1 tablet (100 mg total) by mouth daily. 30 tablet 2  . simvastatin (ZOCOR) 10 MG tablet Take 1 tablet (10 mg total) by mouth every evening. 30 tablet 3  . valACYclovir (VALTREX) 1000 MG tablet Take 0.5 tablets (500 mg total) by mouth daily. 30 tablet 11   No current facility-administered medications for this visit.    Patient confirms/reports the following allergies:  Allergies  Allergen Reactions  . Lisinopril Cough    No orders of the defined types were placed in this encounter.   AUTHORIZATION INFORMATION Primary Insurance: 1D#: Group #:  Secondary Insurance: 1D#: Group #:  SCHEDULE INFORMATION: Date: Friday 09/21/19 Time: Location:[facility]

## 2019-09-03 ENCOUNTER — Other Ambulatory Visit: Payer: Self-pay

## 2019-09-03 ENCOUNTER — Telehealth: Payer: Self-pay

## 2019-09-03 MED ORDER — NA SULFATE-K SULFATE-MG SULF 17.5-3.13-1.6 GM/177ML PO SOLN: 1.0000 | Freq: Once | ORAL | 0 refills | Status: AC

## 2019-09-03 NOTE — Telephone Encounter (Signed)
Returned call to Medication Management.  They informed me that they do not have any bowel preps at all.  Patient will call me to let me know what pharmacy to send prep rx to .  Thanks,  [place], [place]

## 2019-09-03 NOTE — Telephone Encounter (Signed)
Patient called the office back.  She asked for the bowel prep to be sent to [facility] on [address].  I advised her to call me back if prep is too expensive and we will try to get it to her some other way.  Thanks,  [place], [place]

## 2019-09-04 ENCOUNTER — Ambulatory Visit: Payer: Self-pay | Admitting: Licensed Clinical Social Worker

## 2019-09-04 ENCOUNTER — Other Ambulatory Visit: Payer: Self-pay

## 2019-09-04 DIAGNOSIS — F411 Generalized anxiety disorder: Secondary | ICD-10-CM

## 2019-09-04 DIAGNOSIS — F331 Major depressive disorder, recurrent, moderate: Secondary | ICD-10-CM

## 2019-09-04 NOTE — BH Specialist Note (Signed)
Integrated Behavioral Health Follow Up Visit Via Phone  MRN: [id number] Name: Cathlyn Spadaccini   Type of Service: [place] Interpretor:No. Interpretor Name and Language: see above.  SUBJECTIVE: Elaine Robinson is a 64 y.o. female accompanied by herself. Patient was referred by Carlyon Shadow NP for mental health. Patient reports the following symptoms/concerns: She notes that she just finished up her last assessment class today and is scheduled to get her license back June 8th. She notes that her daughter helped her get a vehicle. She reports that she has to pay $140.00 to the [facility] to get her license reinstated. She notes that she is struggling with not being able to smoke cigarettes and its been two months. She notes that she is still trying to loose weight and goes on long walks. She reports that her daughter took her to dinner and bought her a 50 inch TV for her living room. She notes that she is supposed to have her colonoscopy at the end of the month but cannot afford the 119 dollar liquid that she has to drink before the procedure. She denies suicidal and homicidal thoughts.  Duration of problem: ; Severity of problem: mild  OBJECTIVE: Mood: Euthymic and Affect: Appropriate Risk of harm to self or others: No plan to harm self or others  LIFE CONTEXT: Family and Social: see above. School/Work: see above. Self-Care: see above. Life Changes: see above.   GOALS ADDRESSED: Patient will: 1.  Reduce symptoms of: depression  2.  Increase knowledge and/or ability of: healthy habits and self-management skills  3.  Demonstrate ability to: Increase healthy adjustment to current life circumstances  INTERVENTIONS: Interventions utilized:  Supportive Counseling was utilized by clinician during today's follow up session. Clinician processed with the patient regarding how she has been doing since the last follow up session. Clinician measured the patient's  anxiety and depression on a numerical scale. Clinician explained to the client that it sounds like she has been been doing well and is another step closer to having her independence again. Clinician explained to the patient that it sounds like she has been truly blessed with a supportive family. Clinician encouraged the patient to utilize her coping skills and practice self care.  Standardized Assessments completed: GAD-7 and PHQ 9  ASSESSMENT: Patient currently experiencing see above.   Patient may benefit from see above.  PLAN: 1. Follow up with behavioral health clinician on :  2. Behavioral recommendations: see above. 3. Referral(s): [facility] (In Clinic) 4. "From scale of 1-10, how likely are you to follow plan?":   Bayard Hugger, LCSW

## 2019-09-05 ENCOUNTER — Ambulatory Visit (INDEPENDENT_AMBULATORY_CARE_PROVIDER_SITE_OTHER): Payer: Self-pay | Admitting: Physician Assistant

## 2019-09-05 ENCOUNTER — Encounter: Payer: Self-pay | Admitting: Physician Assistant

## 2019-09-05 DIAGNOSIS — Z9889 Other specified postprocedural states: Secondary | ICD-10-CM

## 2019-09-05 NOTE — Progress Notes (Signed)
HPI: Mrs. Bellmore returns now just over 2 months status post left knee arthroscopy.  She states the cortisone injection given on 08/06/2019 was very helpful.  She has occasional popping but is not painful.  She feels overall she is trending towards improvement.  Some pain medial aspect of the knee.  Again she did have some arthritic changes involving the medial femoral condyle.  Physical exam: Left knee full extension full flexion.  Port sites healing well did perform slight keloid.  No instability valgus varus stressing.  No abnormal warmth erythema.  Slight small effusion.  Impression: Status post left knee arthroscopy 06/28/2019  Plan: We will try to get approval for supplemental injection in the knee.  Have her back once is available.  Continue work on Forensic scientist.  Questions encouraged and answered

## 2019-09-18 ENCOUNTER — Telehealth: Payer: Self-pay

## 2019-09-18 ENCOUNTER — Other Ambulatory Visit
Admission: RE | Admit: 2019-09-18 | Discharge: 2019-09-18 | Disposition: A | Discharge status: Home / Self Care | Payer: HRSA Program | Source: Ambulatory Visit | Attending: Gastroenterology | Admitting: Gastroenterology

## 2019-09-18 ENCOUNTER — Other Ambulatory Visit: Payer: Self-pay

## 2019-09-18 DIAGNOSIS — Z01812 Encounter for preprocedural laboratory examination: Secondary | ICD-10-CM | POA: Insufficient documentation

## 2019-09-18 DIAGNOSIS — Z20822 Contact with and (suspected) exposure to covid-19: Secondary | ICD-10-CM | POA: Diagnosis not present

## 2019-09-18 LAB — SARS CORONAVIRUS 2 (TAT 6-24 HRS): SARS Coronavirus 2: NEGATIVE

## 2019-09-18 NOTE — Telephone Encounter (Signed)
Patient came in and picked up a Suprep bowel prep that Sharyn Lull had ready for her at the front desk.

## 2019-09-21 ENCOUNTER — Encounter: Payer: Self-pay | Admitting: Gastroenterology

## 2019-09-21 ENCOUNTER — Ambulatory Visit
Admission: RE | Admit: 2019-09-21 | Discharge: 2019-09-21 | Disposition: A | Discharge status: Home / Self Care | Payer: Self-pay | Attending: Gastroenterology | Admitting: Gastroenterology

## 2019-09-21 ENCOUNTER — Ambulatory Visit: Payer: Self-pay | Admitting: Anesthesiology

## 2019-09-21 ENCOUNTER — Encounter
Admission: RE | Disposition: A | Discharge status: Home / Self Care | Payer: Self-pay | Source: Home / Self Care | Attending: Gastroenterology

## 2019-09-21 DIAGNOSIS — D123 Benign neoplasm of transverse colon: Secondary | ICD-10-CM | POA: Insufficient documentation

## 2019-09-21 DIAGNOSIS — M130 Polyarthritis, unspecified: Secondary | ICD-10-CM | POA: Insufficient documentation

## 2019-09-21 DIAGNOSIS — Z79899 Other long term (current) drug therapy: Secondary | ICD-10-CM | POA: Insufficient documentation

## 2019-09-21 DIAGNOSIS — D125 Benign neoplasm of sigmoid colon: Secondary | ICD-10-CM | POA: Insufficient documentation

## 2019-09-21 DIAGNOSIS — Z7982 Long term (current) use of aspirin: Secondary | ICD-10-CM | POA: Insufficient documentation

## 2019-09-21 DIAGNOSIS — I10 Essential (primary) hypertension: Secondary | ICD-10-CM | POA: Insufficient documentation

## 2019-09-21 DIAGNOSIS — F329 Major depressive disorder, single episode, unspecified: Secondary | ICD-10-CM | POA: Insufficient documentation

## 2019-09-21 DIAGNOSIS — Z87891 Personal history of nicotine dependence: Secondary | ICD-10-CM | POA: Insufficient documentation

## 2019-09-21 DIAGNOSIS — D120 Benign neoplasm of cecum: Secondary | ICD-10-CM | POA: Insufficient documentation

## 2019-09-21 DIAGNOSIS — Z1211 Encounter for screening for malignant neoplasm of colon: Secondary | ICD-10-CM | POA: Insufficient documentation

## 2019-09-21 HISTORY — PX: COLONOSCOPY WITH PROPOFOL: SHX5780

## 2019-09-21 SURGERY — COLONOSCOPY WITH PROPOFOL
Anesthesia: General

## 2019-09-21 MED ORDER — PROPOFOL 10 MG/ML IV BOLUS
INTRAVENOUS | Status: DC | PRN
  Administered 2019-09-21: 40 mg via INTRAVENOUS
  Administered 2019-09-21: 80 ug/kg/min via INTRAVENOUS
  Administered 2019-09-21: 30 mg via INTRAVENOUS

## 2019-09-21 MED ORDER — PROPOFOL 500 MG/50ML IV EMUL
INTRAVENOUS | Status: AC
  Filled 2019-09-21: qty 50

## 2019-09-21 MED ORDER — SODIUM CHLORIDE 0.9 % IV SOLN
INTRAVENOUS | Status: DC
  Administered 2019-09-21: 1000 mL via INTRAVENOUS

## 2019-09-21 NOTE — Anesthesia Postprocedure Evaluation (Signed)
Anesthesia Post Note  Patient: Elaine Robinson  Procedure(s) Performed: COLONOSCOPY WITH PROPOFOL (N/A )  Patient location during evaluation: PACU Anesthesia Type: General Level of consciousness: awake and alert Pain management: pain level controlled Vital Signs Assessment: post-procedure vital signs reviewed and stable Respiratory status: spontaneous breathing, nonlabored ventilation, respiratory function stable and patient connected to nasal cannula oxygen Cardiovascular status: blood pressure returned to baseline and stable Postop Assessment: no apparent nausea or vomiting Anesthetic complications: no     Last Vitals:  Vitals:   09/21/19 0758 09/21/19 0916  BP: 115/79 110/75  Pulse: 73 68  Resp: 16 18  Temp: (!) 36.3 C (!) 36.1 C  SpO2: 100% 100%    Last Pain:  Vitals:   09/21/19 0758  TempSrc: Temporal  PainSc: 0-No pain                 Molli Barrows

## 2019-09-21 NOTE — Transfer of Care (Signed)
Immediate Anesthesia Transfer of Care Note  Patient: Elaine Robinson  Procedure(s) Performed: COLONOSCOPY WITH PROPOFOL (N/A )  Patient Location: PACU  Anesthesia Type:General  Level of Consciousness: sedated  Airway & Oxygen Therapy: Patient Spontanous Breathing and Patient connected to nasal cannula oxygen  Post-op Assessment: Report given to RN and Post -op Vital signs reviewed and stable  Post vital signs: Reviewed and stable  Last Vitals:  Vitals Value Taken Time  BP 110/75 09/21/19 0917  Temp 36.1 C 09/21/19 0916  Pulse 65 09/21/19 0923  Resp 16 09/21/19 0923  SpO2 100 % 09/21/19 0923  Vitals shown include unvalidated device data.  Last Pain:  Vitals:   09/21/19 0758  TempSrc: Temporal  PainSc: 0-No pain         Complications: No apparent anesthesia complications

## 2019-09-21 NOTE — Anesthesia Preprocedure Evaluation (Signed)
Anesthesia Evaluation  Patient identified by MRN, date of birth, ID band Patient awake    Reviewed: Allergy & Precautions, H&P , NPO status , Patient's Chart, lab work & pertinent test results, reviewed documented beta blocker date and time   Airway Mallampati: II   Neck ROM: full    Dental  (+) Poor Dentition   Pulmonary neg pulmonary ROS, former smoker,    Pulmonary exam normal        Cardiovascular Exercise Tolerance: Good hypertension, On Medications negative cardio ROS Normal cardiovascular exam Rhythm:regular Rate:Normal     Neuro/Psych PSYCHIATRIC DISORDERS Depression negative neurological ROS     GI/Hepatic negative GI ROS, Neg liver ROS,   Endo/Other  negative endocrine ROS  Renal/GU negative Renal ROS  negative genitourinary   Musculoskeletal   Abdominal   Peds  Hematology negative hematology ROS (+)   Anesthesia Other Findings Past Medical History: No date: Arthritis     Comment:  hands left knee No date: Depression No date: Hypertension No date: MMT (medial meniscus tear)     Comment:  left knee Past Surgical History: No date: ABDOMINAL HYSTERECTOMY 05/03/2012: BREAST BIOPSY; Right     Comment:  stereo bx 06/28/2019: KNEE ARTHROSCOPY; Left     Comment:  Procedure: LEFT KNEE ARTHROSCOPY WITH PARTIAL MEDIAL               MENISCECTOMY;  Surgeon: Mcarthur Rossetti, MD;                Location: [facility];  Service:               Orthopedics;  Laterality: Left; BMI    Body Mass Index: 32.25 kg/m     Reproductive/Obstetrics negative OB ROS                             Anesthesia Physical Anesthesia Plan  ASA: III  Anesthesia Plan: General   Post-op Pain Management:    Induction:   PONV Risk Score and Plan:   Airway Management Planned:   Additional Equipment:   Intra-op Plan:   Post-operative Plan:   Informed Consent: I have reviewed  the patients History and Physical, chart, labs and discussed the procedure including the risks, benefits and alternatives for the proposed anesthesia with the patient or authorized representative who has indicated his/her understanding and acceptance.     Dental Advisory Given  Plan Discussed with: CRNA  Anesthesia Plan Comments:         Anesthesia Quick Evaluation

## 2019-09-21 NOTE — H&P (Signed)
Jonathon Bellows, MD [address], [place], [place], [place], [address] [address], [place], [place], [place], [address] Phone: [phone]  Fax: [phone]  Primary Care Physician:  Langston Reusing, NP   Pre-Procedure History & Physical: HPI:  Elaine Robinson is a 64 y.o. female is here for an colonoscopy.   Past Medical History:  Diagnosis Date  . Arthritis    hands left knee  . Depression   . Hypertension   . MMT (medial meniscus tear)    left knee    Past Surgical History:  Procedure Laterality Date  . ABDOMINAL HYSTERECTOMY    . BREAST BIOPSY Right 05/03/2012   stereo bx  . KNEE ARTHROSCOPY Left 06/28/2019   Procedure: LEFT KNEE ARTHROSCOPY WITH PARTIAL MEDIAL MENISCECTOMY;  Surgeon: Mcarthur Rossetti, MD;  Location: [facility];  Service: Orthopedics;  Laterality: Left;    Prior to Admission medications   Medication Sig Start Date End Date Taking? Authorizing Provider  amLODipine (NORVASC) 5 MG tablet Take 1 tablet (5 mg total) by mouth daily. 06/21/19  Yes Iloabachie, Chioma E, NP  aspirin 81 MG chewable tablet Chew by mouth daily.   Yes [provider]  simvastatin (ZOCOR) 10 MG tablet Take 1 tablet (10 mg total) by mouth every evening. 06/21/19 06/20/20 Yes Iloabachie, Chioma E, NP  valACYclovir (VALTREX) 1000 MG tablet Take 0.5 tablets (500 mg total) by mouth daily. 04/06/19  Yes Staples, Eliezer Lofts L, PA-C  sertraline (ZOLOFT) 100 MG tablet Take 1 tablet (100 mg total) by mouth daily. 08/01/19 08/31/19  Iloabachie, Chioma E, NP    Allergies as of 08/27/2019 - Review Complete 08/27/2019  Allergen Reaction Noted  . Lisinopril Cough 12/18/2015    History reviewed. No pertinent family history.  Social History   Socioeconomic History  . Marital status: Widowed    Spouse name: Not on file  . Number of children: 1  . Years of education: 11th grade  . Highest education level: 11th grade  Occupational History  . Not on file   Tobacco Use  . Smoking status: Former Smoker    Packs/day: 0.00    Years: 45.00    Pack years: 0.00    Types: Cigarettes    Quit date: 07/02/2019    Years since quitting: 0.2  . Smokeless tobacco: Never Used  Substance and Sexual Activity  . Alcohol use: Not Currently  . Drug use: Not Currently  . Sexual activity: Not Currently    Birth control/protection: None, Surgical  Other Topics Concern  . Not on file  Social History Narrative   Social Determinants screening completed on 07/10/2019. Patient does not require a referral to [facility] at this point in time.    Social Determinants of Health   Financial Resource Strain: Low Risk   . Difficulty of Paying Living Expenses: Not very hard  Food Insecurity: No Food Insecurity  . Worried About Charity fundraiser in the Last Year: Never true  . Ran Out of Food in the Last Year: Never true  Transportation Needs: No Transportation Needs  . Lack of Transportation (Medical): No  . Lack of Transportation (Non-Medical): No  Physical Activity: Sufficiently Active  . Days of Exercise per Week: 5 days  . Minutes of Exercise per Session: 60 min  Stress: Stress Concern Present  . Feeling of Stress : To some extent  Social Connections: Slightly Isolated  . Frequency of Communication with Friends and Family: More than three times  a week  . Frequency of Social Gatherings with Friends and Family: More than three times a week  . Attends Religious Services: More than 4 times per year  . Active Member of Clubs or Organizations: Yes  . Attends Archivist Meetings: More than 4 times per year  . Marital Status: Widowed  Intimate Partner Violence: Not At Risk  . Fear of Current or Ex-Partner: No  . Emotionally Abused: No  . Physically Abused: No  . Sexually Abused: No    Review of Systems: See HPI, otherwise negative ROS  Physical Exam: BP 115/79   Pulse 73   Temp (!) 97.3 F (36.3 C) (Temporal)   Resp 16   Ht 5\' 5"  (1.651 m)   Wt  87.9 kg   SpO2 100%   BMI 32.25 kg/m  General:   Alert,  pleasant and cooperative in NAD Head:  Normocephalic and atraumatic. Neck:  Supple; no masses or thyromegaly. Lungs:  Clear throughout to auscultation, normal respiratory effort.    Heart:  +S1, +S2, Regular rate and rhythm, No edema. Abdomen:  Soft, nontender and nondistended. Normal bowel sounds, without guarding, and without rebound.   Neurologic:  Alert and  oriented x4;  grossly normal neurologically.  Impression/Plan: Elaine Robinson is here for an colonoscopy to be performed for Screening colonoscopy average risk   Risks, benefits, limitations, and alternatives regarding  colonoscopy have been reviewed with the patient.  Questions have been answered.  All parties agreeable.   Jonathon Bellows, MD  09/21/2019, 8:40 AM

## 2019-09-21 NOTE — Op Note (Signed)
[facility] Gastroenterology Patient Name: Elaine Robinson Procedure Date: 09/21/2019 8:45 AM MRN: [id number] Account #: [id number] Date of Birth: Sep 02, 1955 Admit Type: Outpatient Age: 64 Room: [facility] ENDO ROOM 2 Gender: Female Note Status: Finalized Procedure:             Colonoscopy Indications:           Screening for colorectal malignant neoplasm Providers:             Jonathon Bellows MD, MD Referring MD:          No Local Md, MD (Referring MD) Medicines:             Monitored Anesthesia Care Complications:         No immediate complications. Procedure:             Pre-Anesthesia Assessment:                        - Prior to the procedure, a History and Physical was                         performed, and patient medications, allergies and                         sensitivities were reviewed. The patient's tolerance                         of previous anesthesia was reviewed.                        - The risks and benefits of the procedure and the                         sedation options and risks were discussed with the                         patient. All questions were answered and informed                         consent was obtained.                        - ASA Grade Assessment: II - A patient with mild                         systemic disease.                        After obtaining informed consent, the colonoscope was                         passed under direct vision. Throughout the procedure,                         the patient's blood pressure, pulse, and oxygen                         saturations were monitored continuously. The                         Colonoscope was introduced through the anus  and                         advanced to the the cecum, identified by the                         appendiceal orifice. The colonoscopy was performed                         with ease. The patient tolerated the procedure well.                         The quality of  the bowel preparation was excellent. Findings:      The perianal and digital rectal examinations were normal.      A 5 mm polyp was found in the ascending colon. The polyp was sessile.       The polyp was removed with a cold snare. Resection was complete, but the       polyp tissue was not retrieved.      A 10 mm polyp was found in the transverse colon. The polyp was sessile.       The polyp was removed with a cold snare. Resection and retrieval were       complete.      A 6 mm polyp was found in the sigmoid colon. The polyp was sessile. The       polyp was removed with a cold snare. Resection and retrieval were       complete.      The exam was otherwise without abnormality on direct and retroflexion       views.      A 3 mm polyp was found in the cecum. The polyp was sessile. The polyp       was removed with a cold biopsy forceps. Resection and retrieval were       complete. Impression:            - One 5 mm polyp in the ascending colon, removed with                         a cold snare. Complete resection. Polyp tissue not                         retrieved.                        - One 10 mm polyp in the transverse colon, removed                         with a cold snare. Resected and retrieved.                        - One 6 mm polyp in the sigmoid colon, removed with a                         cold snare. Resected and retrieved.                        - The examination was otherwise normal on direct and  retroflexion views. Recommendation:        - Discharge patient to home (with escort).                        - Resume previous diet.                        - Continue present medications.                        - Await pathology results.                        - Repeat colonoscopy for surveillance based on                         pathology results. Procedure Code(s):     --- Professional ---                        [id number], Colonoscopy, flexible; with removal  of                         tumor(s), polyp(s), or other lesion(s) by snare                         technique                        45380, 22, Colonoscopy, flexible; with biopsy, single                         or multiple Diagnosis Code(s):     --- Professional ---                        Z12.11, Encounter for screening for malignant neoplasm                         of colon                        K63.5, Polyp of colon CPT copyright 2019 American Medical Association. All rights reserved. The codes documented in this report are preliminary and upon coder review may  be revised to meet current compliance requirements. Jonathon Bellows, MD Jonathon Bellows MD, MD 09/21/2019 9:17:43 AM This report has been signed electronically. Number of Addenda: 0 Note Initiated On: 09/21/2019 8:45 AM Scope Withdrawal Time: 0 hours 17 minutes 39 seconds  Total Procedure Duration: 0 hours 22 minutes 28 seconds  Estimated Blood Loss:  Estimated blood loss: none.      [facility]

## 2019-09-25 ENCOUNTER — Encounter: Payer: Self-pay | Admitting: Gastroenterology

## 2019-09-25 ENCOUNTER — Ambulatory Visit: Payer: Self-pay | Admitting: Licensed Clinical Social Worker

## 2019-09-25 LAB — SURGICAL PATHOLOGY

## 2019-09-27 ENCOUNTER — Ambulatory Visit: Payer: Self-pay | Admitting: Licensed Clinical Social Worker

## 2019-09-27 ENCOUNTER — Other Ambulatory Visit: Payer: Self-pay

## 2019-09-27 DIAGNOSIS — F331 Major depressive disorder, recurrent, moderate: Secondary | ICD-10-CM

## 2019-09-27 DIAGNOSIS — F411 Generalized anxiety disorder: Secondary | ICD-10-CM

## 2019-09-27 NOTE — BH Specialist Note (Signed)
Integrated Behavioral Health Follow Up Visit Via Phone  MRN: [id number] Name: Elaine Robinson  Type of Service: [place] Interpretor:No. Interpretor Name and Language: not applicable.   SUBJECTIVE: Elaine Robinson is a 64 y.o. female accompanied by herself. Patient was referred by Carlyon Shadow NP for mental health. 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 She notes that she goes June 8th to [facility] to get her license back. She notes that her mood is mostly good but sometimes she will have bad days when certain things happen. She discussed situational stressors with her family. She explains that sometimes when she is struggling that she will call her sister to help her through some of the things with her brother.  Duration of problem: ; Severity of problem: mild  OBJECTIVE: Mood: Euthymic and Affect: Appropriate Risk of harm to self or others: No plan to harm self or others  LIFE CONTEXT: Family and Social: see above. School/Work:see above. Self-Care: see above. Life Changes: see above.   GOALS ADDRESSED: Patient will: 1.  Reduce symptoms of: stress  2.  Increase knowledge and/or ability of: coping skills and self-management skills  3.  Demonstrate ability to: Increase healthy adjustment to current life circumstances  INTERVENTIONS: Interventions utilized:  Supportive Counseling was utilized by the clinician during today's follow up session. Clinician processed with the patient regarding how she has been doing since the last follow up session. Clinician measured the patient's anxiety and depression on a numerical scale. Clinician utilized reflective listening encouraging the patient to ventilate her feelings towards her current situation. Clinician provided psycho education to the patient regarding her brother's diagnosis of  Schizophrenia and the complexities of severe persistent mental illness.  Standardized Assessments completed: GAD-7 and PHQ 9  ASSESSMENT: Patient currently experiencing see above.    Patient may benefit from see above.  PLAN: 1. Follow up with behavioral health clinician on :  2. Behavioral recommendations: see above. 3. Referral(s): [place] (In Clinic) 4. "From scale of 1-10, how likely are you to follow plan?":   Bayard Hugger, LCSW

## 2019-10-18 ENCOUNTER — Other Ambulatory Visit: Payer: Self-pay

## 2019-10-18 ENCOUNTER — Ambulatory Visit: Payer: Self-pay | Admitting: Licensed Clinical Social Worker

## 2019-10-18 DIAGNOSIS — F411 Generalized anxiety disorder: Secondary | ICD-10-CM

## 2019-10-18 DIAGNOSIS — F331 Major depressive disorder, recurrent, moderate: Secondary | ICD-10-CM

## 2019-10-18 NOTE — BH Specialist Note (Signed)
Integrated Behavioral Health Follow Up Visit  MRN: [id number] Name: Elaine Robinson   Type of Service: [place] Interpretor:No. Interpretor Name and Language: not applicable.   SUBJECTIVE: Elaine Robinson is a 64 y.o. female accompanied by herself. Patient was referred by Elaine Shadow NP for mental health.  Patient reports the following symptoms/concerns: She notes that nothing too much has been happening. 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 She notes that it gets her out of the house and keeps her busy. She reports that she got her driver's license back on June 10th and is really happy about it. She notes that she will hang out with her grandchildren a little bit and helps do things around the house for her daughter while she is working. She notes that her knee has been swelling up and hurting bad at times. She notes that she tries to follow the word of God, do the right thing, watch what she says, and does. She notes that she does not miss many Sunday's at church. She denies suicidal and homicidal thoughts.  Duration of problem: ; Severity of problem: mild  OBJECTIVE: Mood: Euthymic and Affect: Appropriate Risk of harm to self or others: No plan to harm self or others  LIFE CONTEXT: Family and Social: see above. School/Work: see above. Self-Care: see above. Life Changes: see above.   GOALS ADDRESSED: Patient will: 1.  Reduce symptoms of: stress  2.  Increase knowledge and/or ability of: self-management skills  3.  Demonstrate ability to: Increase healthy adjustment to current life circumstances  INTERVENTIONS: Interventions utilized:  Supportive Counseling was utilized by the clinician during today's follow up session. Clinician processed with the patient regarding how she has been doing since the last follow up session.  Clinician measured the patient's anxiety and depression on a numerical scale. Clinician congratulated the patient on how she has been doing since the last follow up session.  Standardized Assessments completed: GAD-7 and PHQ 9  ASSESSMENT: Patient currently experiencing see above.   Patient may benefit from see above.  PLAN: 1. Follow up with behavioral health clinician on :  2. Behavioral recommendations: see above. 3. Referral(s): [place] (In Clinic) 4. "From scale of 1-10, how likely are you to follow plan?":   Bayard Hugger, LCSW

## 2019-11-01 ENCOUNTER — Other Ambulatory Visit: Payer: Self-pay

## 2019-11-01 MED ORDER — SERTRALINE HCL 100 MG PO TABS: 100.0000 mg | ORAL_TABLET | Freq: Every day | ORAL | 2 refills | Status: DC

## 2019-11-07 ENCOUNTER — Other Ambulatory Visit: Payer: Self-pay

## 2019-11-07 DIAGNOSIS — Z0000 Encounter for general adult medical examination without abnormal findings: Secondary | ICD-10-CM

## 2019-11-07 DIAGNOSIS — N63 Unspecified lump in unspecified breast: Secondary | ICD-10-CM

## 2019-11-08 ENCOUNTER — Ambulatory Visit: Payer: Self-pay | Admitting: Licensed Clinical Social Worker

## 2019-11-08 LAB — COMPREHENSIVE METABOLIC PANEL
ALT: 12 IU/L (ref 0–32)
AST: 14 IU/L (ref 0–40)
Albumin/Globulin Ratio: 1.4 (ref 1.2–2.2)
Albumin: 4.3 g/dL (ref 3.8–4.8)
Alkaline Phosphatase: 70 IU/L (ref 48–121)
BUN/Creatinine Ratio: 16 (ref 12–28)
BUN: 12 mg/dL (ref 8–27)
Bilirubin Total: 0.3 mg/dL (ref 0.0–1.2)
CO2: 21 mmol/L (ref 20–29)
Calcium: 9.3 mg/dL (ref 8.7–10.3)
Chloride: 105 mmol/L (ref 96–106)
Creatinine, Ser: 0.73 mg/dL (ref 0.57–1.00)
GFR calc Af Amer: 103 mL/min/{1.73_m2} (ref >59)
GFR calc non Af Amer: 89 mL/min/{1.73_m2} (ref >59)
Globulin, Total: 3 g/dL (ref 1.5–4.5)
Glucose: 68 mg/dL (ref 65–99)
Potassium: 4.3 mmol/L (ref 3.5–5.2)
Sodium: 140 mmol/L (ref 134–144)
Total Protein: 7.3 g/dL (ref 6.0–8.5)

## 2019-11-08 LAB — CBC WITH DIFFERENTIAL/PLATELET
Basophils Absolute: 0 10*3/uL (ref 0.0–0.2)
Basos: 1 %
EOS (ABSOLUTE): 0.3 10*3/uL (ref 0.0–0.4)
Eos: 8 %
Hematocrit: 37.7 % (ref 34.0–46.6)
Hemoglobin: 12.4 g/dL (ref 11.1–15.9)
Immature Grans (Abs): 0 10*3/uL (ref 0.0–0.1)
Immature Granulocytes: 0 %
Lymphocytes Absolute: 1.4 10*3/uL (ref 0.7–3.1)
Lymphs: 33 %
MCH: 29.4 pg (ref 26.6–33.0)
MCHC: 32.9 g/dL (ref 31.5–35.7)
MCV: 89 fL (ref 79–97)
Monocytes Absolute: 0.4 10*3/uL (ref 0.1–0.9)
Monocytes: 8 %
Neutrophils Absolute: 2.1 10*3/uL (ref 1.4–7.0)
Neutrophils: 50 %
Platelets: 257 10*3/uL (ref 150–450)
RBC: 4.22 x10E6/uL (ref 3.77–5.28)
RDW: 13.1 % (ref 11.7–15.4)
WBC: 4.2 10*3/uL (ref 3.4–10.8)

## 2019-11-08 LAB — LIPID PANEL
Chol/HDL Ratio: 3 ratio (ref 0.0–4.4)
Cholesterol, Total: 158 mg/dL (ref 100–199)
HDL: 52 mg/dL (ref >39)
LDL Chol Calc (NIH): 93 mg/dL (ref 0–99)
Triglycerides: 67 mg/dL (ref 0–149)
VLDL Cholesterol Cal: 13 mg/dL (ref 5–40)

## 2019-11-08 LAB — HEMOGLOBIN A1C
Est. average glucose Bld gHb Est-mCnc: 114 mg/dL
Hgb A1c MFr Bld: 5.6 % (ref 4.8–5.6)

## 2019-11-14 ENCOUNTER — Ambulatory Visit: Payer: Self-pay | Admitting: Gerontology

## 2019-11-15 ENCOUNTER — Encounter: Payer: Self-pay | Admitting: Gerontology

## 2019-11-15 ENCOUNTER — Ambulatory Visit: Payer: Self-pay | Admitting: Gerontology

## 2019-11-15 ENCOUNTER — Other Ambulatory Visit: Payer: Self-pay

## 2019-11-15 VITALS — BP 113/72 | HR 78 | Ht 65.5 in | Wt 195.0 lb

## 2019-11-15 DIAGNOSIS — I10 Essential (primary) hypertension: Secondary | ICD-10-CM

## 2019-11-15 DIAGNOSIS — E785 Hyperlipidemia, unspecified: Secondary | ICD-10-CM

## 2019-11-15 DIAGNOSIS — L282 Other prurigo: Secondary | ICD-10-CM | POA: Insufficient documentation

## 2019-11-15 MED ORDER — TRIAMCINOLONE ACETONIDE 0.1 % EX CREA: 1.0000 "application " | TOPICAL_CREAM | Freq: Two times a day (BID) | CUTANEOUS | 0 refills | Status: DC

## 2019-11-15 MED ORDER — SIMVASTATIN 10 MG PO TABS: 10.0000 mg | ORAL_TABLET | Freq: Every evening | ORAL | 3 refills | Status: DC

## 2019-11-15 MED ORDER — AMLODIPINE BESYLATE 5 MG PO TABS: 5.0000 mg | ORAL_TABLET | Freq: Every day | ORAL | 3 refills | Status: DC

## 2019-11-15 NOTE — Patient Instructions (Signed)

## 2019-11-15 NOTE — Progress Notes (Signed)
Established Patient Office Visit  Subjective:  Patient ID: Elaine Robinson, female    DOB: February 19, 1956  Age: 64 y.o. MRN: [id number]  CC:  Chief Complaint  Patient presents with  . Hypertension  . Rash    for 3 months, on lower part of middle back, 2 spots, itchy    HPI Elaine Robinson is a 64 y.o female who presents for follow up of hypertensions and lab review. She verbalized being compliant with her treatment regimen. She states she no longer smokes or consumes alcohol. Her HgbA1C done 11/07/2019 decreased from 5.7% to 5.6%. All other labs were within normal limit. She complained of piuritic rash on middle and lower  back which has been ongoing for the past 3 months. She denies fever, chills, or changes in detergent or soap. She had Colonoscopy done on 09/21/2019 which showed: - One 5 mm polyp in the ascending colon, removed with a cold snare. Complete resection. Polyp tissue not retrieved. - One 10 mm polyp in the transverse colon, removed with a cold snare. Resected and retrieved. - One 6 mm polyp in the sigmoid colon, removed with a cold snare. Resected and retrieved. - The examination was otherwise normal on direct and retroflexion views, Per Dr. Bailey Mech A. She denies fever, dizziness, chest pain or dyspnea with exertion. Overall, she states that she is doing well and offers no additional complaint.  Past Medical History:  Diagnosis Date  . Arthritis    hands left knee  . Depression   . Hypertension   . MMT (medial meniscus tear)    left knee    Past Surgical History:  Procedure Laterality Date  . ABDOMINAL HYSTERECTOMY    . BREAST BIOPSY Right 05/03/2012   stereo bx  . COLONOSCOPY WITH PROPOFOL N/A 09/21/2019   Procedure: COLONOSCOPY WITH PROPOFOL;  Surgeon: Jonathon Bellows, MD;  Location: [facility] ENDOSCOPY;  Service: Gastroenterology;  Laterality: N/A;  . KNEE ARTHROSCOPY Left 06/28/2019   Procedure: LEFT KNEE ARTHROSCOPY WITH PARTIAL MEDIAL MENISCECTOMY;  Surgeon: Mcarthur Rossetti, MD;  Location: [facility];  Service: Orthopedics;  Laterality: Left;    History reviewed. No pertinent family history.  Social History   Socioeconomic History  . Marital status: Widowed    Spouse name: Not on file  . Number of children: 1  . Years of education: 11th grade  . Highest education level: 11th grade  Occupational History  . Occupation: unemployed  Tobacco Use  . Smoking status: Former Smoker    Packs/day: 0.00    Years: 45.00    Pack years: 0.00    Types: Cigarettes    Quit date: 07/02/2019    Years since quitting: 0.3  . Smokeless tobacco: Never Used  Vaping Use  . Vaping Use: Never used  Substance and Sexual Activity  . Alcohol use: Not Currently  . Drug use: Not Currently  . Sexual activity: Not Currently    Birth control/protection: None, Surgical  Other Topics Concern  . Not on file  Social History Narrative   Social Determinants screening completed on 07/10/2019. Patient does not require a referral to [place] [facility] at this point in time.    Social Determinants of Health   Financial Resource Strain: Low Risk   . Difficulty of Paying Living Expenses: Not very hard  Food Insecurity: No Food Insecurity  . Worried About Charity fundraiser in the Last Year: Never true  . Ran Out of Food in the Last Year: Never true  Transportation  Needs: No Transportation Needs  . Lack of Transportation (Medical): No  . Lack of Transportation (Non-Medical): No  Physical Activity: Sufficiently Active  . Days of Exercise per Week: 5 days  . Minutes of Exercise per Session: 60 min  Stress: Stress Concern Present  . Feeling of Stress : To some extent  Social Connections: Moderately Integrated  . Frequency of Communication with Friends and Family: More than three times a week  . Frequency of Social Gatherings with Friends and Family: More than three times a week  . Attends Religious Services: More than 4 times per year  . Active Member of Clubs or  Organizations: Yes  . Attends Archivist Meetings: More than 4 times per year  . Marital Status: Widowed  Intimate Partner Violence: Not At Risk  . Fear of Current or Ex-Partner: No  . Emotionally Abused: No  . Physically Abused: No  . Sexually Abused: No    Outpatient Medications Prior to Visit  Medication Sig Dispense Refill  . aspirin 81 MG chewable tablet Chew by mouth daily.    . sertraline (ZOLOFT) 100 MG tablet Take 1 tablet (100 mg total) by mouth daily. 30 tablet 2  . valACYclovir (VALTREX) 1000 MG tablet Take 0.5 tablets (500 mg total) by mouth daily. 30 tablet 11  . amLODipine (NORVASC) 5 MG tablet Take 1 tablet (5 mg total) by mouth daily. 30 tablet 3  . simvastatin (ZOCOR) 10 MG tablet Take 1 tablet (10 mg total) by mouth every evening. 30 tablet 3   No facility-administered medications prior to visit.    Allergies  Allergen Reactions  . Lisinopril Cough    ROS Review of Systems  Constitutional: Negative.   Respiratory: Negative.   Cardiovascular: Negative.   Genitourinary: Negative.   Musculoskeletal: Negative.   Skin: Positive for rash (.Non erythamatous rash to her mid  back).  Neurological: Negative.   Hematological: Negative.   Psychiatric/Behavioral: Negative.       Objective:    Physical Exam Constitutional:      Appearance: Normal appearance.  HENT:     Head: Normocephalic and atraumatic.  Cardiovascular:     Rate and Rhythm: Regular rhythm.     Pulses: Normal pulses.     Heart sounds: Normal heart sounds.  Pulmonary:     Effort: Pulmonary effort is normal.     Breath sounds: Normal breath sounds.  Skin:    Findings: Rash (. Small round, non erythamatous,localized to her mid back) present.       Neurological:     General: No focal deficit present.     Mental Status: She is alert and oriented to person, place, and time.  Psychiatric:        Mood and Affect: Mood normal.        Behavior: Behavior normal.        Thought  Content: Thought content normal.        Judgment: Judgment normal.     BP 113/72 (BP Location: Left Arm, Patient Position: Sitting)   Pulse 78   Ht 5' 5.5" (1.664 m)   Wt 195 lb (88.5 kg)   SpO2 97%   BMI 31.96 kg/m  Wt Readings from Last 3 Encounters:  11/15/19 195 lb (88.5 kg)  11/07/19 194 lb 6.4 oz (88.2 kg)  09/21/19 193 lb 13.3 oz (87.9 kg)     Health Maintenance Due  Topic Date Due  . Hepatitis C Screening  Never done  . HIV Screening  Never done  . TETANUS/TDAP  Never done  . PAP SMEAR-Modifier  Never done    There are no preventive care reminders to display for this patient.  Lab Results  Component Value Date   TSH 1.940 12/27/2018   Lab Results  Component Value Date   WBC 4.2 11/07/2019   HGB 12.4 11/07/2019   HCT 37.7 11/07/2019   MCV 89 11/07/2019   PLT 257 11/07/2019   Lab Results  Component Value Date   NA 140 11/07/2019   K 4.3 11/07/2019   CO2 21 11/07/2019   GLUCOSE 68 11/07/2019   BUN 12 11/07/2019   CREATININE 0.73 11/07/2019   BILITOT 0.3 11/07/2019   ALKPHOS 70 11/07/2019   AST 14 11/07/2019   ALT 12 11/07/2019   PROT 7.3 11/07/2019   ALBUMIN 4.3 11/07/2019   CALCIUM 9.3 11/07/2019   Lab Results  Component Value Date   CHOL 158 11/07/2019   Lab Results  Component Value Date   HDL 52 11/07/2019   Lab Results  Component Value Date   LDLCALC 93 11/07/2019   Lab Results  Component Value Date   TRIG 67 11/07/2019   Lab Results  Component Value Date   CHOLHDL 3.0 11/07/2019   Lab Results  Component Value Date   HGBA1C 5.6 11/07/2019      Assessment & Plan:   1. Essential hypertension Her hypertension is under control. She will continue with current treatment regimen.  - amLODipine (NORVASC) 5 MG tablet; Take 1 tablet (5 mg total) by mouth daily.  Dispense: 30 tablet; Refill: 3 -She was advised to continue DASH diet and exercise as tolerated  2. Elevated lipids She will continue with current treatment regimen  -  simvastatin (ZOCOR) 10 MG tablet; Take 1 tablet (10 mg total) by mouth every evening.  Dispense: 30 tablet; Refill: 3 -she was advised to continue low carb/cholesterol diet and exercise as tolerated.  3. Pruritic rash She will be started on Kenalog. She was advised on side effects of medication and was informed to notify clinic. - triamcinolone cream (KENALOG) 0.1 %; Apply 1 application topically 2 (two) times daily.  Dispense: 30 g; Refill: 0     Follow-up: Return in about 5 weeks (around 12/20/2019), or if symptoms worsen or fail to improve.    Carney Corners, RN

## 2019-11-20 ENCOUNTER — Ambulatory Visit: Payer: Self-pay | Admitting: Licensed Clinical Social Worker

## 2019-11-29 ENCOUNTER — Ambulatory Visit: Payer: Self-pay | Admitting: Licensed Clinical Social Worker

## 2019-12-03 ENCOUNTER — Encounter: Payer: Self-pay | Admitting: Emergency Medicine

## 2019-12-03 ENCOUNTER — Other Ambulatory Visit: Payer: Self-pay

## 2019-12-03 ENCOUNTER — Emergency Department
Admission: EM | Admit: 2019-12-03 | Discharge: 2019-12-03 | Disposition: A | Discharge status: Home / Self Care | Payer: Self-pay | Attending: Student in an Organized Health Care Education/Training Program | Admitting: Student in an Organized Health Care Education/Training Program

## 2019-12-03 DIAGNOSIS — Z79899 Other long term (current) drug therapy: Secondary | ICD-10-CM | POA: Insufficient documentation

## 2019-12-03 DIAGNOSIS — I10 Essential (primary) hypertension: Secondary | ICD-10-CM | POA: Insufficient documentation

## 2019-12-03 DIAGNOSIS — Z87891 Personal history of nicotine dependence: Secondary | ICD-10-CM | POA: Insufficient documentation

## 2019-12-03 DIAGNOSIS — Z7982 Long term (current) use of aspirin: Secondary | ICD-10-CM | POA: Insufficient documentation

## 2019-12-03 DIAGNOSIS — R21 Rash and other nonspecific skin eruption: Secondary | ICD-10-CM | POA: Insufficient documentation

## 2019-12-03 NOTE — Discharge Instructions (Addendum)
Continue to use the triamcinolone cream, [facility]

## 2019-12-03 NOTE — ED Triage Notes (Signed)
Presents with rash for several days

## 2019-12-03 NOTE — ED Provider Notes (Signed)
[facility] Emergency Department Provider Note  ____________________________________________   First MD Initiated Contact with Patient 12/03/19 1126     (approximate)  I have reviewed the triage vital signs and the nursing notes.   HISTORY  Chief Complaint Rash    HPI Elaine Robinson is a 64 y.o. female presents emergency for several days similar symptoms on her back was given triamcinolone cream.  She also has a few places in her scalp.  She denies itching.  She denies any  drainage from the area.  No fever or chills.   Past Medical History:  Diagnosis Date  . Arthritis    hands left knee  . Depression   . Hypertension   . MMT (medial meniscus tear)    left knee    Patient Active Problem List   Diagnosis Date Noted  . Pruritic rash 11/15/2019  . Pain of right breast 08/16/2019  . Acute medial meniscus tear of left knee 06/28/2019  . H/O abdominal hysterectomy 04/06/2019  . History of herpes genitalis 04/03/2019  . Smoking 04/03/2019  . Left knee pain 04/03/2019  . Prediabetes 01/30/2019  . Encounter to establish care 12/21/2018  . Essential hypertension 12/21/2018  . Elevated lipids 12/21/2018  . History of depression 12/21/2018  . Visual blurriness 12/21/2018    Past Surgical History:  Procedure Laterality Date  . ABDOMINAL HYSTERECTOMY    . BREAST BIOPSY Right 05/03/2012   stereo bx  . COLONOSCOPY WITH PROPOFOL N/A 09/21/2019   Procedure: COLONOSCOPY WITH PROPOFOL;  Surgeon: Jonathon Bellows, MD;  Location: [facility] ENDOSCOPY;  Service: Gastroenterology;  Laterality: N/A;  . KNEE ARTHROSCOPY Left 06/28/2019   Procedure: LEFT KNEE ARTHROSCOPY WITH PARTIAL MEDIAL MENISCECTOMY;  Surgeon: Mcarthur Rossetti, MD;  Location: [place];  Service: Orthopedics;  Laterality: Left;    Prior to Admission medications   Medication Sig Start Date End Date Taking? Authorizing Provider  amLODipine (NORVASC) 5 MG tablet Take 1 tablet (5  mg total) by mouth daily. 11/15/19   Iloabachie, Chioma E, NP  aspirin 81 MG chewable tablet Chew by mouth daily.    [provider]  sertraline (ZOLOFT) 100 MG tablet Take 1 tablet (100 mg total) by mouth daily. 11/01/19 12/01/19  Iloabachie, Chioma E, NP  simvastatin (ZOCOR) 10 MG tablet Take 1 tablet (10 mg total) by mouth every evening. 11/15/19 11/14/20  Iloabachie, Chioma E, NP  triamcinolone cream (KENALOG) 0.1 % Apply 1 application topically 2 (two) times daily. 11/15/19   Iloabachie, Chioma E, NP  valACYclovir (VALTREX) 1000 MG tablet Take 0.5 tablets (500 mg total) by mouth daily. 04/06/19   Staples, Eliezer Lofts L, PA-C    Allergies Lisinopril  History reviewed. No pertinent family history.  Social History Social History   Tobacco Use  . Smoking status: Former Smoker    Packs/day: 0.00    Years: 45.00    Pack years: 0.00    Types: Cigarettes    Quit date: 07/02/2019    Years since quitting: 0.4  . Smokeless tobacco: Never Used  Vaping Use  . Vaping Use: Never used  Substance Use Topics  . Alcohol use: Not Currently  . Drug use: Not Currently    Review of Systems  Constitutional: No fever/chills Eyes: No visual changes. ENT: No sore throat. Respiratory: Denies cough Genitourinary: Negative for dysuria. Musculoskeletal: Negative for back pain. Skin: Positive for rash. Psychiatric: no mood changes,     ____________________________________________   PHYSICAL EXAM:  VITAL SIGNS: ED Triage  Vitals  Enc Vitals Group     BP 12/03/19 0928 112/85     Pulse Rate 12/03/19 0928 68     Resp 12/03/19 0928 16     Temp 12/03/19 0928 98.4 F (36.9 C)     Temp Source 12/03/19 0928 Oral     SpO2 12/03/19 0928 98 %     Weight 12/03/19 0907 196 lb 3.4 oz (89 kg)     Height 12/03/19 0907 5' 5.5" (1.664 m)     Head Circumference --      Peak Flow --      Pain Score 12/03/19 0907 0     Pain Loc --      Pain Edu? --      Excl. in [place]? --     Constitutional: Alert and  oriented. Well appearing and in no acute distress. Eyes: Conjunctivae are normal.  Head: Atraumatic. Nose: No congestion/rhinnorhea. Mouth/Throat: Mucous membranes are moist.   Neck:  supple no lymphadenopathy noted Cardiovascular: Normal rate, regular rhythm.  Respiratory: Normal respiratory effort.  No retractions,  GU: deferred Musculoskeletal: FROM all extremities, warm and well perfused Neurologic:  Normal speech and language.  Skin:  Skin is warm, dry and intact.  Small clear bumps noted on the elbow, back of neck, and lower spine.  No drainage noted.  No pustules noted Psychiatric: Mood and affect are normal. Speech and behavior are normal.  ____________________________________________   LABS (all labs ordered are listed, but only abnormal results are displayed)  Labs Reviewed - No data to display ____________________________________________   ____________________________________________  RADIOLOGY    ____________________________________________   PROCEDURES  Procedure(s) performed: No  Procedures    ____________________________________________   INITIAL IMPRESSION / ASSESSMENT AND PLAN / ED COURSE  Pertinent labs & imaging results that were available during my care of the patient were reviewed by me and considered in my medical decision making (see chart for details).   Patient is 64 year old female presents emergency department with a rash.  See HPI physical exam shows small clear bumps on the elbow, back of neck, lower back.  No drainage.    Discussed findings with the patient.  Encouraged her to continue to use triamcinolone cream.  Follow-up with dermatology.  She [facility]  Explained [facility] that [facility].  We are not skin specialist, feel that she needs one this time.     Elaine Robinson was evaluated in Emergency Department on 12/03/2019 for the symptoms described in the  history of present illness. She was evaluated in the context of the global COVID-19 pandemic, which necessitated consideration that the patient might be at risk for infection with the SARS-CoV-2 virus that causes COVID-19. Institutional protocols and algorithms that pertain to the evaluation of patients at risk for COVID-19 are in a state of rapid change based on information released by regulatory bodies including the CDC and federal and state organizations. These policies and algorithms were followed during the patient's care in the ED.    As part of my medical decision making, I reviewed the following data within the [place] notes reviewed and incorporated, Old chart reviewed, Notes from prior ED visits and  Controlled Substance Database  ____________________________________________   FINAL CLINICAL IMPRESSION(S) / ED DIAGNOSES  Final diagnoses:  Rash      NEW MEDICATIONS STARTED DURING THIS VISIT:  New Prescriptions   No medications on file  Note:  This document was prepared using Dragon voice recognition software and may include unintentional dictation errors.    Versie Starks, PA-C 12/03/19 1158    Merlyn Lot, MD 12/03/19 [phone]

## 2019-12-11 ENCOUNTER — Other Ambulatory Visit: Payer: Self-pay

## 2019-12-11 ENCOUNTER — Ambulatory Visit: Payer: Self-pay | Admitting: Licensed Clinical Social Worker

## 2019-12-11 DIAGNOSIS — F411 Generalized anxiety disorder: Secondary | ICD-10-CM

## 2019-12-11 DIAGNOSIS — F331 Major depressive disorder, recurrent, moderate: Secondary | ICD-10-CM

## 2019-12-11 NOTE — BH Specialist Note (Signed)
Integrated Behavioral Health Follow Up Visit Via Phone  MRN: [id number] Name: Elaine Robinson   Type of Service: [place] Interpretor:No. Interpretor Name and Language: not applicable.   SUBJECTIVE: Elaine Robinson is a 64 y.o. female accompanied by herself. Patient was referred by Carlyon Shadow NP for mental health. Patient reports the following symptoms/concerns: She notes that getting a car and being able to drive around has took away a lot stress and depression. She notes that she is still trying to go to church on every Sunday and Wednesday nights. 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 She notes that she gets out of the house three times a week and sometimes just wants to stay at home. She notes that her knee is still popping and hurting sometimes. She notes that she is trying to loose weight and has cut out the snacking. She denies suicidal and homicidal thoughts.  Duration of problem: ; Severity of problem: mild  OBJECTIVE: Mood: Euthymic and Affect: Appropriate Risk of harm to self or others: No plan to harm self or others  LIFE CONTEXT: Family and Social: see above. School/Work: see above. Self-Care: see above. Life Changes: see above.   GOALS ADDRESSED: Patient will: 1.  Reduce symptoms of: stress  2.  Increase knowledge and/or ability of: self-management skills  3.  Demonstrate ability to: Increase healthy adjustment to current life circumstances  INTERVENTIONS: Interventions utilized:  Supportive Counseling was utilized by the clinician during today's follow up session. Clinician processed with the patient regarding how she has been doing since the last follow up session. Clinician measured the patient's anxiety and depression on a numerical scale. Clinician informed the patient that she has put in her notice at [facility] and her last day is on Thursday August 19th. Clinician informed the  patient that she has been training an intern named Jerrilyn Cairo and is transferring her case load to her.  Standardized Assessments completed: GAD-7 and PHQ 9 GAD 7 6 PHQ 9 4 ASSESSMENT: Patient currently experiencing see above.  Patient may benefit from see above.   PLAN: 1. Follow up with behavioral health clinician on :  2. Behavioral recommendations: see above.  3. Referral(s): [place] (In Clinic) 4. "From scale of 1-10, how likely are you to follow plan?":   Bayard Hugger, LCSW

## 2019-12-20 ENCOUNTER — Other Ambulatory Visit: Payer: Self-pay

## 2019-12-20 ENCOUNTER — Ambulatory Visit: Payer: Self-pay | Admitting: Gerontology

## 2019-12-20 DIAGNOSIS — L282 Other prurigo: Secondary | ICD-10-CM

## 2019-12-20 NOTE — Progress Notes (Signed)
Established Patient Office Visit  Subjective:  Patient ID: Elaine Robinson, female    DOB: 1956/04/12  Age: 64 y.o. MRN: [id number]  CC: No chief complaint on file. Patient consents to telephone visit and 2 patient identifiers was used to identify patient.  HPI Elaine Robinson presents for follow up of non pruritic erythematous Rash on her back. She was seen at the ED on 8/9 for the rash to her back and was advised to follow up with Dermatologist. Currently, she states that the rashes has resolved with using Triamcinolone cream. Overall, she states that she's doing well and offers no further complaint.  Past Medical History:  Diagnosis Date  . Arthritis    hands left knee  . Depression   . Hypertension   . MMT (medial meniscus tear)    left knee    Past Surgical History:  Procedure Laterality Date  . ABDOMINAL HYSTERECTOMY    . BREAST BIOPSY Right 05/03/2012   stereo bx  . COLONOSCOPY WITH PROPOFOL N/A 09/21/2019   Procedure: COLONOSCOPY WITH PROPOFOL;  Surgeon: Jonathon Bellows, MD;  Location: [facility] ENDOSCOPY;  Service: Gastroenterology;  Laterality: N/A;  . KNEE ARTHROSCOPY Left 06/28/2019   Procedure: LEFT KNEE ARTHROSCOPY WITH PARTIAL MEDIAL MENISCECTOMY;  Surgeon: Mcarthur Rossetti, MD;  Location: [place];  Service: Orthopedics;  Laterality: Left;    No family history on file.  Social History   Socioeconomic History  . Marital status: Widowed    Spouse name: Not on file  . Number of children: 1  . Years of education: 11th grade  . Highest education level: 11th grade  Occupational History  . Occupation: unemployed  Tobacco Use  . Smoking status: Former Smoker    Packs/day: 0.00    Years: 45.00    Pack years: 0.00    Types: Cigarettes    Quit date: 07/02/2019    Years since quitting: 0.4  . Smokeless tobacco: Never Used  Vaping Use  . Vaping Use: Never used  Substance and Sexual Activity  . Alcohol use: Not Currently  . Drug use: Not Currently   . Sexual activity: Not Currently    Birth control/protection: None, Surgical  Other Topics Concern  . Not on file  Social History Narrative   Social Determinants screening completed on 07/10/2019. Patient does not require a referral to [place] [facility] at this point in time.    Social Determinants of Health   Financial Resource Strain: Low Risk   . Difficulty of Paying Living Expenses: Not very hard  Food Insecurity: No Food Insecurity  . Worried About Charity fundraiser in the Last Year: Never true  . Ran Out of Food in the Last Year: Never true  Transportation Needs: No Transportation Needs  . Lack of Transportation (Medical): No  . Lack of Transportation (Non-Medical): No  Physical Activity: Sufficiently Active  . Days of Exercise per Week: 5 days  . Minutes of Exercise per Session: 60 min  Stress: Stress Concern Present  . Feeling of Stress : To some extent  Social Connections: Moderately Integrated  . Frequency of Communication with Friends and Family: More than three times a week  . Frequency of Social Gatherings with Friends and Family: More than three times a week  . Attends Religious Services: More than 4 times per year  . Active Member of Clubs or Organizations: Yes  . Attends Archivist Meetings: More than 4 times per year  . Marital Status: Widowed  Human resources officer  Violence: Not At Risk  . Fear of Current or Ex-Partner: No  . Emotionally Abused: No  . Physically Abused: No  . Sexually Abused: No    Outpatient Medications Prior to Visit  Medication Sig Dispense Refill  . amLODipine (NORVASC) 5 MG tablet Take 1 tablet (5 mg total) by mouth daily. 30 tablet 3  . aspirin 81 MG chewable tablet Chew by mouth daily.    . sertraline (ZOLOFT) 100 MG tablet Take 1 tablet (100 mg total) by mouth daily. 30 tablet 2  . simvastatin (ZOCOR) 10 MG tablet Take 1 tablet (10 mg total) by mouth every evening. 30 tablet 3  . triamcinolone cream (KENALOG) 0.1 % Apply 1 application  topically 2 (two) times daily. 30 g 0  . valACYclovir (VALTREX) 1000 MG tablet Take 0.5 tablets (500 mg total) by mouth daily. 30 tablet 11   No facility-administered medications prior to visit.    Allergies  Allergen Reactions  . Lisinopril Cough    ROS Review of Systems  Constitutional: Negative.   Respiratory: Negative.   Cardiovascular: Negative.   Skin: Negative.   Neurological: Negative.   Psychiatric/Behavioral: Negative.       Objective:    Physical Exam No physical exam was done There were no vitals taken for this visit. Wt Readings from Last 3 Encounters:  12/03/19 196 lb 3.4 oz (89 kg)  11/15/19 195 lb (88.5 kg)  11/07/19 194 lb 6.4 oz (88.2 kg)     Health Maintenance Due  Topic Date Due  . Hepatitis C Screening  Never done  . HIV Screening  Never done  . TETANUS/TDAP  Never done  . PAP SMEAR-Modifier  Never done  . INFLUENZA VACCINE  11/25/2019    There are no preventive care reminders to display for this patient.  Lab Results  Component Value Date   TSH 1.940 12/27/2018   Lab Results  Component Value Date   WBC 4.2 11/07/2019   HGB 12.4 11/07/2019   HCT 37.7 11/07/2019   MCV 89 11/07/2019   PLT 257 11/07/2019   Lab Results  Component Value Date   NA 140 11/07/2019   K 4.3 11/07/2019   CO2 21 11/07/2019   GLUCOSE 68 11/07/2019   BUN 12 11/07/2019   CREATININE 0.73 11/07/2019   BILITOT 0.3 11/07/2019   ALKPHOS 70 11/07/2019   AST 14 11/07/2019   ALT 12 11/07/2019   PROT 7.3 11/07/2019   ALBUMIN 4.3 11/07/2019   CALCIUM 9.3 11/07/2019   Lab Results  Component Value Date   CHOL 158 11/07/2019   Lab Results  Component Value Date   HDL 52 11/07/2019   Lab Results  Component Value Date   LDLCALC 93 11/07/2019   Lab Results  Component Value Date   TRIG 67 11/07/2019   Lab Results  Component Value Date   CHOLHDL 3.0 11/07/2019   Lab Results  Component Value Date   HGBA1C 5.6 11/07/2019      Assessment & Plan:     1. Pruritic rash - Her rash has resolved, she was advised to notify clinic with eruption of any rash.    Follow-up: Return in about 19 days (around 01/08/2020), or if symptoms worsen or fail to improve.    Anastacia Reinecke Jerold Coombe, NP

## 2019-12-26 ENCOUNTER — Ambulatory Visit: Payer: Self-pay | Attending: Oncology | Admitting: *Deleted

## 2019-12-26 ENCOUNTER — Other Ambulatory Visit: Payer: Self-pay

## 2019-12-26 ENCOUNTER — Ambulatory Visit
Admission: RE | Admit: 2019-12-26 | Discharge: 2019-12-26 | Disposition: A | Discharge status: Home / Self Care | Payer: Self-pay | Source: Ambulatory Visit | Attending: Oncology | Admitting: Oncology

## 2019-12-26 VITALS — BP 126/86 | HR 71 | Temp 98.8°F | Ht 67.17 in | Wt 194.3 lb

## 2019-12-26 DIAGNOSIS — Z0000 Encounter for general adult medical examination without abnormal findings: Secondary | ICD-10-CM | POA: Insufficient documentation

## 2019-12-26 NOTE — Patient Instructions (Signed)
Gave patient hand-out, Women Staying Healthy, Active and Well from [facility], with education on breast health, pap smears, heart and colon health. 

## 2019-12-26 NOTE — Progress Notes (Signed)
  Subjective:     Patient ID: Elaine Robinson, female   DOB: Nov 07, 1955, 64 y.o.   MRN: [id number]   [facility] Medical History Record - 12/26/19 1051      Breast History   Screening cycle New    [facility] ([facility]    Initial Mammogram 12/26/19    Last Mammogram Annual    Last Mammogram Date 01/25/19    Provider (Mammogram)  [facility] Poli    Recent Breast Symptoms None      Breast Cancer History   Breast Cancer History No personal or family history      Previous History of Breast Problems   Breast Surgery or Biopsy None    Breast Implants N/A    BSE Done Never      Gynecological/Obstetrical History   Is there any chance that the client could be pregnant?  No    Age at menarche 64    Age at menopause Hysterectomy 1994    PAP smear history Annually    Date of last PAP  01/04/17    Provider (PAP) Koleen Nimrod    Age at first live birth 51    Breast fed children No    DES Exposure No    Cervical, Uterine or Ovarian cancer No    Family history of Cervial, Uterine or Ovarian cancer No    Hysterectomy Yes    Cervix removed Yes    Ovaries removed No    Laser/Cryosurgery No    Current method of birth control None    Current method of Estrogen/Hormone replacement None    Smoking history Yes   5 cigarettes            Review of Systems     Objective:   Physical Exam Chest:     Breasts:        Right: No swelling, bleeding, inverted nipple, mass, nipple discharge, skin change or tenderness.        Left: No swelling, bleeding, inverted nipple, mass, nipple discharge, skin change or tenderness.    Lymphadenopathy:     Upper Body:     Right upper body: No supraclavicular or axillary adenopathy.     Left upper body: No supraclavicular or axillary adenopathy.        Assessment:     64 year old Black female returns to [facility] for annual screening.  Clinical breast exam unremarkable.  Taught self breast awareness.  Patient with a history of hysterectomy. Pap omitted per protocol.   Patient has been screened for eligibility.  She does not have any insurance, Medicare or Medicaid.  She also meets financial eligibility.  Risk Assessment    Risk Scores      12/26/2019 01/05/2019   Last edited by: Theodore Demark, RN [place], Laddie Aquas, CMA   5-year risk:     Lifetime risk:               Plan:     Screening mammogram ordered.  Will follow up per [facility] protocol.

## 2019-12-27 ENCOUNTER — Other Ambulatory Visit: Payer: Self-pay | Admitting: *Deleted

## 2019-12-27 DIAGNOSIS — R920 Mammographic microcalcification found on diagnostic imaging of breast: Secondary | ICD-10-CM

## 2020-01-08 ENCOUNTER — Ambulatory Visit: Payer: Self-pay | Admitting: Licensed Clinical Social Worker

## 2020-01-08 ENCOUNTER — Other Ambulatory Visit: Payer: Self-pay

## 2020-01-08 DIAGNOSIS — F411 Generalized anxiety disorder: Secondary | ICD-10-CM

## 2020-01-08 DIAGNOSIS — F331 Major depressive disorder, recurrent, moderate: Secondary | ICD-10-CM

## 2020-01-08 NOTE — BH Specialist Note (Signed)
Integrated Behavioral Health Follow Up Visit  MRN: [id number] Name: Elaine Robinson    Type of Service: [place] Interpretor:No. Interpretor Name and Language: none  SUBJECTIVE: Elaine Robinson is a 64 y.o. female accompanied by herself Patient was referred by Carlyon Shadow, NP for mental health Patient reports the following symptoms/concerns: Patient reports she is doing really good now and "is not depressed at all anymore." She states she accidentally threw away her medication in the trash and is concerned because medication management said they would not refill it. She states that since she got a car four months ago her life has improved by being able to go when she wants to. The patient discussed how helping other people has lifted her mood and improved her quality of life. The patient also discussed how much help her previous clinician was and how she will miss her. The patient denies suicidal and homicidal thoughts.  Duration of problem:; Severity of problem: mild  OBJECTIVE: Mood: Euthymic and Affect: Appropriate Risk of harm to self or others: No plan to harm self or others  LIFE CONTEXT: Family and Social: see above School/Work: see above Self-Care: see above Life Changes: see above  GOALS ADDRESSED: Patient will: 1.  Reduce symptoms of: stress  2.  Increase knowledge and/or ability of: coping skills and stress reduction  3.  Demonstrate ability to: Increase healthy adjustment to current life circumstances  INTERVENTIONS: Interventions utilized:  Supportive Counseling was utilized by the clinician during today's follow-up session. Clinician processed with the patient regarding how she has been doing since the last follow-up session. Clinician measured the patients anxiety and depression on a numerical scale. Clinician processed with the patient her feelings regarding the change in her mental health provider and explained that it is  normal to miss her previous clinician whom she had established a relationship with. Clinician offered the patient praise for utilizing her coping skills to manage her symptoms of anxiety and depression and incorporating self care into her daily routines.  Standardized Assessments completed: GAD-7 and PHQ 9  Gad-7 6 PHQ 9 0  ASSESSMENT: Patient currently experiencing see above   Patient may benefit from see above.  PLAN: 1. Follow up with behavioral health clinician on : 01/29/2020 at 4:00 pm  2. Behavioral recommendations:  3. Referral(s): [place] (In Clinic) 4. "From scale of 1-10, how likely are you to follow plan?":   Lesli Albee, Student-Social Work

## 2020-01-09 ENCOUNTER — Other Ambulatory Visit: Payer: Self-pay

## 2020-01-09 ENCOUNTER — Encounter: Payer: Self-pay | Admitting: Gerontology

## 2020-01-09 ENCOUNTER — Ambulatory Visit: Payer: Self-pay | Admitting: Gerontology

## 2020-01-09 VITALS — BP 111/76 | HR 75 | Ht 65.0 in | Wt 192.0 lb

## 2020-01-09 DIAGNOSIS — I10 Essential (primary) hypertension: Secondary | ICD-10-CM

## 2020-01-09 DIAGNOSIS — Z972 Presence of dental prosthetic device (complete) (partial): Secondary | ICD-10-CM | POA: Insufficient documentation

## 2020-01-09 DIAGNOSIS — K0889 Other specified disorders of teeth and supporting structures: Secondary | ICD-10-CM

## 2020-01-09 DIAGNOSIS — R42 Dizziness and giddiness: Secondary | ICD-10-CM

## 2020-01-09 NOTE — Progress Notes (Signed)
Established Patient Office Visit  Subjective:  Patient ID: Elaine Robinson, female    DOB: 06/07/1955  Age: 64 y.o. MRN: [id number]  CC:  Chief Complaint  Patient presents with  . Hypertension    HPI Elaine Robinson is a 64 year old female who presents for follow up of hypertension. She reports compliance with her medication as well as changing her diet by removing soda and drinking more water as well as decreasing her portions. She denies chest pain, palpitations, or shortness of breath. Patient does report what she describes as "flashes of dizziness" throughout the day for the last year. She states that they happen about 3 times a day and only last for a "split second" before resolving. Denies aggravating factors. Denies any particular time of day or activity that brings the symptoms on. Denies syncope. Patient also requesting help with her ill-fitting dentures. She states that they are discolored and cause her top lip to protrude. Overall, patient reports that she is doing well and has no further complaints.   Past Medical History:  Diagnosis Date  . Arthritis    hands left knee  . Depression   . Hypertension   . MMT (medial meniscus tear)    left knee    Past Surgical History:  Procedure Laterality Date  . ABDOMINAL HYSTERECTOMY    . BREAST BIOPSY Right 05/03/2012   stereo bx  . COLONOSCOPY WITH PROPOFOL N/A 09/21/2019   Procedure: COLONOSCOPY WITH PROPOFOL;  Surgeon: Jonathon Bellows, MD;  Location: [facility] ENDOSCOPY;  Service: Gastroenterology;  Laterality: N/A;  . KNEE ARTHROSCOPY Left 06/28/2019   Procedure: LEFT KNEE ARTHROSCOPY WITH PARTIAL MEDIAL MENISCECTOMY;  Surgeon: Mcarthur Rossetti, MD;  Location: [place];  Service: Orthopedics;  Laterality: Left;    Family History  Problem Relation Age of Onset  . Breast cancer Neg Hx     Social History   Socioeconomic History  . Marital status: Widowed    Spouse name: Not on file  . Number of children: 1   . Years of education: 11th grade  . Highest education level: 11th grade  Occupational History  . Occupation: unemployed  Tobacco Use  . Smoking status: Former Smoker    Packs/day: 0.00    Years: 45.00    Pack years: 0.00    Types: Cigarettes    Quit date: 07/02/2019    Years since quitting: 0.5  . Smokeless tobacco: Never Used  Vaping Use  . Vaping Use: Never used  Substance and Sexual Activity  . Alcohol use: Not Currently  . Drug use: Not Currently  . Sexual activity: Not Currently    Birth control/protection: None, Surgical  Other Topics Concern  . Not on file  Social History Narrative   Social Determinants screening completed on 07/10/2019. Patient does not require a referral to [facility] at this point in time.    Social Determinants of Health   Financial Resource Strain: Low Risk   . Difficulty of Paying Living Expenses: Not very hard  Food Insecurity: No Food Insecurity  . Worried About Charity fundraiser in the Last Year: Never true  . Ran Out of Food in the Last Year: Never true  Transportation Needs: No Transportation Needs  . Lack of Transportation (Medical): No  . Lack of Transportation (Non-Medical): No  Physical Activity: Sufficiently Active  . Days of Exercise per Week: 5 days  . Minutes of Exercise per Session: 60 min  Stress: No Stress Concern Present  .  Feeling of Stress : Only a little  Social Connections: Moderately Integrated  . Frequency of Communication with Friends and Family: More than three times a week  . Frequency of Social Gatherings with Friends and Family: More than three times a week  . Attends Religious Services: More than 4 times per year  . Active Member of Clubs or Organizations: Yes  . Attends Archivist Meetings: More than 4 times per year  . Marital Status: Widowed  Intimate Partner Violence: Not At Risk  . Fear of Current or Ex-Partner: No  . Emotionally Abused: No  . Physically Abused: No  . Sexually Abused: No     Outpatient Medications Prior to Visit  Medication Sig Dispense Refill  . amLODipine (NORVASC) 5 MG tablet Take 1 tablet (5 mg total) by mouth daily. 30 tablet 3  . aspirin 81 MG chewable tablet Chew by mouth daily.    . simvastatin (ZOCOR) 10 MG tablet Take 1 tablet (10 mg total) by mouth every evening. 30 tablet 3  . valACYclovir (VALTREX) 1000 MG tablet Take 0.5 tablets (500 mg total) by mouth daily. 30 tablet 11  . sertraline (ZOLOFT) 100 MG tablet Take 1 tablet (100 mg total) by mouth daily. 30 tablet 2  . triamcinolone cream (KENALOG) 0.1 % Apply 1 application topically 2 (two) times daily. (Patient not taking: Reported on 01/09/2020) 30 g 0   No facility-administered medications prior to visit.    Allergies  Allergen Reactions  . Lisinopril Cough    ROS Review of Systems  Constitutional: Negative.   HENT: Positive for dental problem.        Ill fitting and discolored dentures  Respiratory: Negative for chest tightness and shortness of breath.   Cardiovascular: Negative for chest pain, palpitations and leg swelling.  Neurological: Positive for dizziness.       Patient reports intermittent dizziness that she describes as a "flash" x 1 year. Reports no aggravating factors. States that it quickly subsides on its own. No syncope.  Psychiatric/Behavioral: Negative.       Objective:    Physical Exam Constitutional:      Appearance: Normal appearance.  HENT:     Mouth/Throat:     Mouth: Mucous membranes are moist.     Comments: Top dentures Cardiovascular:     Rate and Rhythm: Normal rate and regular rhythm.     Heart sounds: Normal heart sounds.  Pulmonary:     Effort: Pulmonary effort is normal.     Breath sounds: Normal breath sounds.  Neurological:     General: No focal deficit present.     Mental Status: She is alert.  Psychiatric:        Mood and Affect: Mood normal.        Behavior: Behavior normal.     BP 111/76 (BP Location: Right Arm, Patient  Position: Sitting)   Pulse 75   Ht 5\' 5"  (1.651 m)   Wt 192 lb (87.1 kg)   SpO2 97%   BMI 31.95 kg/m  Wt Readings from Last 3 Encounters:  01/09/20 192 lb (87.1 kg)  12/26/19 194 lb 4.8 oz (88.1 kg)  12/03/19 196 lb 3.4 oz (89 kg)     Health Maintenance Due  Topic Date Due  . Hepatitis C Screening  Never done  . HIV Screening  Never done  . TETANUS/TDAP  Never done  . PAP SMEAR-Modifier  Never done  . INFLUENZA VACCINE  Never done    There are  no preventive care reminders to display for this patient.  Lab Results  Component Value Date   TSH 1.940 12/27/2018   Lab Results  Component Value Date   WBC 4.2 11/07/2019   HGB 12.4 11/07/2019   HCT 37.7 11/07/2019   MCV 89 11/07/2019   PLT 257 11/07/2019   Lab Results  Component Value Date   NA 140 11/07/2019   K 4.3 11/07/2019   CO2 21 11/07/2019   GLUCOSE 68 11/07/2019   BUN 12 11/07/2019   CREATININE 0.73 11/07/2019   BILITOT 0.3 11/07/2019   ALKPHOS 70 11/07/2019   AST 14 11/07/2019   ALT 12 11/07/2019   PROT 7.3 11/07/2019   ALBUMIN 4.3 11/07/2019   CALCIUM 9.3 11/07/2019   Lab Results  Component Value Date   CHOL 158 11/07/2019   Lab Results  Component Value Date   HDL 52 11/07/2019   Lab Results  Component Value Date   LDLCALC 93 11/07/2019   Lab Results  Component Value Date   TRIG 67 11/07/2019   Lab Results  Component Value Date   CHOLHDL 3.0 11/07/2019   Lab Results  Component Value Date   HGBA1C 5.6 11/07/2019      Assessment & Plan:   1. Essential hypertension  Blood pressure is controlled. Continue medication as prescribed (Norvasc 5mg  tab PO once daily). Continue diet improvements and follow DASH diet. Take your blood pressure once a day and keep a log of your readings for the next visit.   2. Dizziness  Keep a log of what you are doing, the time of day, and how often you experience the "flashes" of dizziness and bring to next appointment in 1 month. Please go to the  emergency department for any prolonged dizziness, vision disturbances, severe headache, facial drooping, or changes in your symptoms.  3. Ill-fitting dentures  Follow up with Dental. Provided financial application.   No orders of the defined types were placed in this encounter.   Follow-up: Return in about 4 weeks (around 02/06/2020), or if symptoms worsen or fail to improve.    Marina Gravel, Student-NP

## 2020-01-10 ENCOUNTER — Ambulatory Visit
Admission: RE | Admit: 2020-01-10 | Discharge: 2020-01-10 | Disposition: A | Discharge status: Home / Self Care | Payer: Self-pay | Source: Ambulatory Visit | Attending: Oncology | Admitting: Oncology

## 2020-01-10 ENCOUNTER — Other Ambulatory Visit: Payer: Self-pay | Admitting: *Deleted

## 2020-01-10 ENCOUNTER — Other Ambulatory Visit: Payer: Self-pay

## 2020-01-10 DIAGNOSIS — R920 Mammographic microcalcification found on diagnostic imaging of breast: Secondary | ICD-10-CM

## 2020-01-28 ENCOUNTER — Other Ambulatory Visit: Payer: Self-pay | Admitting: *Deleted

## 2020-01-28 ENCOUNTER — Encounter: Payer: Self-pay | Admitting: *Deleted

## 2020-01-28 DIAGNOSIS — R920 Mammographic microcalcification found on diagnostic imaging of breast: Secondary | ICD-10-CM

## 2020-01-28 NOTE — Progress Notes (Signed)
Letter mailed to inform patient of her next mammogram appointment on 07/10/20 @ 10:20.

## 2020-01-29 ENCOUNTER — Other Ambulatory Visit: Payer: Self-pay

## 2020-01-29 ENCOUNTER — Ambulatory Visit: Payer: Self-pay | Admitting: Licensed Clinical Social Worker

## 2020-01-29 DIAGNOSIS — F411 Generalized anxiety disorder: Secondary | ICD-10-CM

## 2020-01-29 DIAGNOSIS — F331 Major depressive disorder, recurrent, moderate: Secondary | ICD-10-CM

## 2020-01-29 MED ORDER — SERTRALINE HCL 100 MG PO TABS: 100.0000 mg | ORAL_TABLET | Freq: Every day | ORAL | 2 refills | Status: DC

## 2020-01-29 NOTE — BH Specialist Note (Signed)
Integrated Behavioral Health Follow Up Visit  MRN: [id number] Name: Elaine Robinson    Type of Service: [place] Interpretor:No. Interpretor Name and Language:  SUBJECTIVE: Elaine Robinson is a 64 y.o. female accompanied by herself  Patient was referred by Carlyon Shadow, NP  for mental health  Patient reports the following symptoms/concerns: The patient reports things have been going good since her last appointment. The patient reports that since she has a car she can go where she likes too and that is nice. The patient states her brother that has been sick for years and a grandson that also has the same condition are a  lot to deal with at times. She states she has been worrying about them and other things a lot lately. She states she is not sleeping as well and is tired a lot during the day. The patient states she has a real hard time concentrating on things and has shared this with her son. The patient denies suicidal or homicidal thoughts.  Duration of problem; Severity of problem: moderate  OBJECTIVE: Mood: Euthymic and Affect: Appropriate Risk of harm to self or others: No plan to harm self or others  LIFE CONTEXT: Family and Social: see above  School/Work: see above Self-Care: see above Life Changes: see above  GOALS ADDRESSED: Patient will: 1.  Reduce symptoms of: anxiety, depression and stress  2.  Increase knowledge and/or ability of: coping skills and stress reduction  3.  Demonstrate ability to: Increase healthy adjustment to current life circumstances  INTERVENTIONS: Interventions utilized:  Supportive Counseling was utilized by the clinican during today's follow-up session. The clinician processed with the patient how she has been doing since the last follow-up session. The clinician utilized reflective listening to provide a space for the patient to ventilate her frustrations towards her current life circumstances. The clinician  measured the patients anxiety and depression on a numerical scale. Clinician encouraged the patient to continue to utilize her coping skills to deal with the familial stressors in her life. The clinician encouraged the patient to continue to practice self care.  Standardized Assessments completed: GAD-7 and PHQ 9  Gad-7 17 PHQ- 9  11  ASSESSMENT: Patient currently experiencing see above  Patient may benefit from see above  PLAN: 1. Follow up with behavioral health clinician on : 02/12/2020 at 2:30 pm  2. Behavioral recommendations:  3. Referral(s): [place] (In Clinic) 4. "From scale of 1-10, how likely are you to follow plan?":   Lesli Albee, Student-Social Work

## 2020-02-06 ENCOUNTER — Other Ambulatory Visit: Payer: Self-pay

## 2020-02-06 ENCOUNTER — Ambulatory Visit: Payer: Self-pay | Admitting: Gerontology

## 2020-02-06 VITALS — BP 128/81 | HR 75 | Resp 16 | Wt 192.9 lb

## 2020-02-06 DIAGNOSIS — I10 Essential (primary) hypertension: Secondary | ICD-10-CM

## 2020-02-06 DIAGNOSIS — R42 Dizziness and giddiness: Secondary | ICD-10-CM

## 2020-02-06 NOTE — Progress Notes (Signed)
Established Patient Office Visit  Subjective:  Patient ID: Elaine Robinson, female    DOB: [birth year]  Age: 64 y.o. MRN: [id number]  CC:  Chief Complaint  Patient presents with  . Follow-up    HPI Elaine Robinson presents for follow up of hypertension and intermittent dizziness. She does not check her blood pressure at home She reports compliance with her medication as well as continuing health lifestyle modifications with her diet and activity. She denies chest pain, palpitations, or shortness of breath. Patient reports that her "dizzy spells" have largely decreased, but she did have one yesterday. She states that it lasts for "split second" before resolving. Denies aggravating factors. Denies any particular time of day or activity that brings the symptoms on. Denies syncope or focal neuro deficits. Overall, patient reports that she is doing well and has no further complaints.   Past Medical History:  Diagnosis Date  . Arthritis    hands left knee  . Depression   . Hypertension   . MMT (medial meniscus tear)    left knee    Past Surgical History:  Procedure Laterality Date  . ABDOMINAL HYSTERECTOMY    . BREAST BIOPSY Right 05/03/2012   stereo bx  . COLONOSCOPY WITH PROPOFOL N/A 09/21/2019   Procedure: COLONOSCOPY WITH PROPOFOL;  Surgeon: Jonathon Bellows, MD;  Location: [facility] ENDOSCOPY;  Service: Gastroenterology;  Laterality: N/A;  . KNEE ARTHROSCOPY Left 06/28/2019   Procedure: LEFT KNEE ARTHROSCOPY WITH PARTIAL MEDIAL MENISCECTOMY;  Surgeon: Mcarthur Rossetti, MD;  Location: [place];  Service: Orthopedics;  Laterality: Left;    Family History  Problem Relation Age of Onset  . Breast cancer Neg Hx     Social History   Socioeconomic History  . Marital status: Widowed    Spouse name: Not on file  . Number of children: 1  . Years of education: 11th grade  . Highest education level: 11th grade  Occupational History  . Occupation: unemployed  Tobacco Use   . Smoking status: Former Smoker    Packs/day: 0.00    Years: 45.00    Pack years: 0.00    Types: Cigarettes    Quit date: 07/02/2019    Years since quitting: 0.6  . Smokeless tobacco: Never Used  Vaping Use  . Vaping Use: Never used  Substance and Sexual Activity  . Alcohol use: Not Currently  . Drug use: Not Currently  . Sexual activity: Not Currently    Birth control/protection: None, Surgical  Other Topics Concern  . Not on file  Social History Narrative   Social Determinants screening completed on 07/10/2019. Patient does not require a referral to [facility] at this point in time.    Social Determinants of Health   Financial Resource Strain: Low Risk   . Difficulty of Paying Living Expenses: Not very hard  Food Insecurity: No Food Insecurity  . Worried About Charity fundraiser in the Last Year: Never true  . Ran Out of Food in the Last Year: Never true  Transportation Needs: No Transportation Needs  . Lack of Transportation (Medical): No  . Lack of Transportation (Non-Medical): No  Physical Activity: Sufficiently Active  . Days of Exercise per Week: 5 days  . Minutes of Exercise per Session: 60 min  Stress: No Stress Concern Present  . Feeling of Stress : Only a little  Social Connections: Moderately Integrated  . Frequency of Communication with Friends and Family: More than three times a week  . Frequency  of Social Gatherings with Friends and Family: More than three times a week  . Attends Religious Services: More than 4 times per year  . Active Member of Clubs or Organizations: Yes  . Attends Archivist Meetings: More than 4 times per year  . Marital Status: Widowed  Intimate Partner Violence: Not At Risk  . Fear of Current or Ex-Partner: No  . Emotionally Abused: No  . Physically Abused: No  . Sexually Abused: No    Outpatient Medications Prior to Visit  Medication Sig Dispense Refill  . amLODipine (NORVASC) 5 MG tablet Take 1 tablet (5 mg total) by  mouth daily. 30 tablet 3  . aspirin 81 MG chewable tablet Chew by mouth daily.    . sertraline (ZOLOFT) 100 MG tablet Take 1 tablet (100 mg total) by mouth daily. 30 tablet 2  . simvastatin (ZOCOR) 10 MG tablet Take 1 tablet (10 mg total) by mouth every evening. 30 tablet 3  . valACYclovir (VALTREX) 1000 MG tablet Take 0.5 tablets (500 mg total) by mouth daily. 30 tablet 11   No facility-administered medications prior to visit.    Allergies  Allergen Reactions  . Lisinopril Cough    ROS Review of Systems  Constitutional: Negative.   Respiratory: Negative.   Cardiovascular: Negative.   Gastrointestinal: Negative.   Skin: Negative.   Neurological: Positive for dizziness.       Intermittent, lasts a "split second" before self-resolving without intervention  Psychiatric/Behavioral: Negative.       Objective:    Physical Exam Constitutional:      Appearance: Normal appearance.  Cardiovascular:     Rate and Rhythm: Normal rate and regular rhythm.     Heart sounds: Normal heart sounds.  Pulmonary:     Effort: Pulmonary effort is normal.     Breath sounds: Normal breath sounds.  Skin:    General: Skin is warm and dry.  Neurological:     General: No focal deficit present.     Mental Status: She is alert and oriented to person, place, and time.     Motor: No weakness.     Gait: Gait normal.  Psychiatric:        Mood and Affect: Mood normal.        Behavior: Behavior normal.     BP 128/81 (BP Location: Left Arm, Patient Position: Sitting, Cuff Size: Normal)   Pulse 75   Resp 16   Wt 192 lb 14.4 oz (87.5 kg)   SpO2 97%   BMI 32.10 kg/m  Wt Readings from Last 3 Encounters:  02/06/20 192 lb 14.4 oz (87.5 kg)  01/09/20 192 lb (87.1 kg)  12/26/19 194 lb 4.8 oz (88.1 kg)     Health Maintenance Due  Topic Date Due  . Hepatitis C Screening  Never done  . HIV Screening  Never done  . TETANUS/TDAP  Never done  . PAP SMEAR-Modifier  Never done  . INFLUENZA VACCINE   Never done    There are no preventive care reminders to display for this patient.  Lab Results  Component Value Date   TSH 1.940 12/27/2018   Lab Results  Component Value Date   WBC 4.2 11/07/2019   HGB 12.4 11/07/2019   HCT 37.7 11/07/2019   MCV 89 11/07/2019   PLT 257 11/07/2019   Lab Results  Component Value Date   NA 140 11/07/2019   K 4.3 11/07/2019   CO2 21 11/07/2019   GLUCOSE 68 11/07/2019  BUN 12 11/07/2019   CREATININE 0.73 11/07/2019   BILITOT 0.3 11/07/2019   ALKPHOS 70 11/07/2019   AST 14 11/07/2019   ALT 12 11/07/2019   PROT 7.3 11/07/2019   ALBUMIN 4.3 11/07/2019   CALCIUM 9.3 11/07/2019   Lab Results  Component Value Date   CHOL 158 11/07/2019   Lab Results  Component Value Date   HDL 52 11/07/2019   Lab Results  Component Value Date   LDLCALC 93 11/07/2019   Lab Results  Component Value Date   TRIG 67 11/07/2019   Lab Results  Component Value Date   CHOLHDL 3.0 11/07/2019   Lab Results  Component Value Date   HGBA1C 5.6 11/07/2019      Assessment & Plan:   1. Essential hypertension Blood pressure is controlled. Continue medication as prescribed (Norvasc 5mg  tab PO once daily). Continue diet improvements and follow DASH diet. Advised to take blood pressure once a day and keep a log  for the next visit.   2. Dizziness Patient advised to keep a log of what she is doing, the time of day, and how often she experiences the "flashes" of dizziness and bring to next appointment in 3 month. Patient counselled go to the emergency department for any prolonged dizziness, vision disturbances, severe headache, facial drooping, or changes in her symptoms.   Follow-up: Return in about 3 months (around 05/08/2020), or if symptoms worsen or fail to improve.    Marina Gravel, Student-NP

## 2020-02-12 ENCOUNTER — Ambulatory Visit: Payer: Self-pay | Admitting: Licensed Clinical Social Worker

## 2020-02-14 ENCOUNTER — Telehealth: Payer: Self-pay | Admitting: Licensed Clinical Social Worker

## 2020-02-14 NOTE — Telephone Encounter (Signed)
Called and rescheduled patient's appointment with her to see [facility].

## 2020-02-14 NOTE — Telephone Encounter (Deleted)
-----   Message from Lesli Albee, [place] [employer] sent at 02/12/2020  2:33 PM EDT ----- Regarding: Appt Would you please call Ms. Weichel to reschedule her appointment. Please schedule her in the afternoon if possible for one hour  follow up on Tuesday 11/09 or later.  Thanks

## 2020-02-25 ENCOUNTER — Other Ambulatory Visit: Payer: Self-pay | Admitting: Family Medicine

## 2020-02-25 DIAGNOSIS — B009 Herpesviral infection, unspecified: Secondary | ICD-10-CM

## 2020-03-04 ENCOUNTER — Other Ambulatory Visit: Payer: Self-pay

## 2020-03-04 ENCOUNTER — Ambulatory Visit: Payer: Self-pay | Admitting: Licensed Clinical Social Worker

## 2020-03-04 DIAGNOSIS — Z8659 Personal history of other mental and behavioral disorders: Secondary | ICD-10-CM

## 2020-03-04 DIAGNOSIS — F411 Generalized anxiety disorder: Secondary | ICD-10-CM

## 2020-03-04 NOTE — BH Specialist Note (Signed)
Integrated Behavioral Health Follow Up Visit  MRN: [id number] Name: Elaine Robinson   Type of Service: [place] Interpretor:No. Interpretor Name and Language:   SUBJECTIVE: Elaine Robinson is a 65 y.o. female accompanied by herself  Patient was referred by Carlyon Shadow, NP  for mental health . Patient reports the following symptoms/concerns: The patient went to [facility] and got her a pair of glasses. She stated, "everything is going very well, and I am keeping very busy."  The patient discussed what a blessing her car has been and the impact having transportation has had on her life. The patient discussed her plans to spend Thanksgiving with her daughter and grandchildren and is looking forward to the holidays. The patient stated that she is sleeping and eating well, and is taking her medication as prescribed. The patient denied any homicidal or suicidal thoughts.  Duration of problem: ; Severity of problem: mild  OBJECTIVE: Mood: Euthymic and Affect: Appropriate Risk of harm to self or others: No plan to harm self or others  LIFE CONTEXT: Family and Social: see above  School/Work: see above  Self-Care: see above Life Changes: see above  GOALS ADDRESSED: Patient will: 1.  Reduce symptoms of: anxiety, depression and stress  2.  Increase knowledge and/or ability of: coping skills, self-management skills and stress reduction  3.  Demonstrate ability to: Increase healthy adjustment to current life circumstances  INTERVENTIONS: Interventions utilized:  Supportive Counseling was utilized by clinician during today's follow up session. Clinician processed with the patient regarding how she has been doing since the last follow up session. Clinician measured the patient's anxiety and depression on a numerical scale. Clinician explained to the client that it sounds like she has been been doing well since her last follow-up appointment and having her  independence to get around has helped her. Clinician congratulated the patient for her progress and for utilizing her coping skills and incorporating self care into her routine. Standardized Assessments completed: GAD-7 and PHQ 9  GAD-7  4 PHQ-9  3  ASSESSMENT: Patient currently experiencing see above  Patient may benefit from see above  PLAN: 1. Follow up with behavioral health clinician on : 04/08/2020 at 5:00 PM  2. Behavioral recommendations:  3. Referral(s): [place] (In Clinic) 4. "From scale of 1-10, how likely are you to follow plan?":   Lesli Albee, Student-Social Work

## 2020-03-24 ENCOUNTER — Other Ambulatory Visit: Payer: Self-pay | Admitting: Gerontology

## 2020-03-24 DIAGNOSIS — E785 Hyperlipidemia, unspecified: Secondary | ICD-10-CM

## 2020-03-25 ENCOUNTER — Other Ambulatory Visit: Payer: Self-pay | Admitting: Gerontology

## 2020-03-25 DIAGNOSIS — E785 Hyperlipidemia, unspecified: Secondary | ICD-10-CM

## 2020-04-01 ENCOUNTER — Other Ambulatory Visit: Payer: Self-pay | Admitting: Gerontology

## 2020-04-01 DIAGNOSIS — F411 Generalized anxiety disorder: Secondary | ICD-10-CM

## 2020-04-01 MED ORDER — SERTRALINE HCL 100 MG PO TABS: 100.0000 mg | ORAL_TABLET | Freq: Every day | ORAL | 2 refills | Status: DC

## 2020-04-08 ENCOUNTER — Ambulatory Visit: Payer: Self-pay | Admitting: Licensed Clinical Social Worker

## 2020-04-08 ENCOUNTER — Other Ambulatory Visit: Payer: Self-pay

## 2020-04-08 DIAGNOSIS — F411 Generalized anxiety disorder: Secondary | ICD-10-CM

## 2020-04-08 DIAGNOSIS — F331 Major depressive disorder, recurrent, moderate: Secondary | ICD-10-CM
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
## 2020-04-11 ENCOUNTER — Other Ambulatory Visit: Payer: Self-pay | Admitting: *Deleted

## 2020-04-11 DIAGNOSIS — Z122 Encounter for screening for malignant neoplasm of respiratory organs: Secondary | ICD-10-CM

## 2020-04-11 DIAGNOSIS — Z87891 Personal history of nicotine dependence: Secondary | ICD-10-CM

## 2020-04-11 NOTE — Progress Notes (Signed)
Contacted and scheduled for annual lung screening scan. Patient is a former smoker, quit 07/02/19, 33.75 pack year history.

## 2020-04-21 ENCOUNTER — Other Ambulatory Visit: Payer: Self-pay | Admitting: Gerontology

## 2020-04-21 DIAGNOSIS — I10 Essential (primary) hypertension: Secondary | ICD-10-CM

## 2020-04-23 NOTE — Telephone Encounter (Signed)
Please refill Ms. Elaine Robinson's Amlodipine 5 mg daily Thank you

## 2020-04-30 ENCOUNTER — Other Ambulatory Visit: Payer: Self-pay

## 2020-04-30 ENCOUNTER — Ambulatory Visit
Admission: RE | Admit: 2020-04-30 | Discharge: 2020-04-30 | Disposition: A | Discharge status: Home / Self Care | Payer: Self-pay | Source: Ambulatory Visit | Attending: Nurse Practitioner | Admitting: Nurse Practitioner

## 2020-04-30 DIAGNOSIS — Z87891 Personal history of nicotine dependence: Secondary | ICD-10-CM

## 2020-04-30 DIAGNOSIS — Z122 Encounter for screening for malignant neoplasm of respiratory organs: Secondary | ICD-10-CM

## 2020-05-02 ENCOUNTER — Encounter: Payer: Self-pay | Admitting: *Deleted

## 2020-05-07 ENCOUNTER — Ambulatory Visit: Payer: Self-pay | Admitting: Gerontology

## 2020-05-07 ENCOUNTER — Other Ambulatory Visit: Payer: Self-pay | Admitting: Adult Health

## 2020-05-07 ENCOUNTER — Other Ambulatory Visit: Payer: Self-pay | Admitting: Gerontology

## 2020-05-07 DIAGNOSIS — B009 Herpesviral infection, unspecified: Secondary | ICD-10-CM

## 2020-05-07 MED ORDER — VALACYCLOVIR HCL 500 MG PO TABS: 500.0000 mg | ORAL_TABLET | Freq: Every day | ORAL | 2 refills | Status: DC

## 2020-05-07 NOTE — Progress Notes (Signed)
Refills sent to pharmacy. 

## 2020-05-14 ENCOUNTER — Ambulatory Visit: Payer: Self-pay | Admitting: Licensed Clinical Social Worker

## 2020-05-15 ENCOUNTER — Ambulatory Visit: Payer: Self-pay | Admitting: Pharmacist

## 2020-05-15 ENCOUNTER — Other Ambulatory Visit: Payer: Self-pay

## 2020-05-15 DIAGNOSIS — Z79899 Other long term (current) drug therapy: Secondary | ICD-10-CM

## 2020-05-15 NOTE — Progress Notes (Signed)
Medication Management Clinic Visit Note  Patient: Elaine Robinson MRN: [id number] Date of Birth: February 23, 1956 PCP: Langston Reusing, NP   [name] 65 y.o. female presents for a telephone visit for medication management today. Verified patient with two identifiers.  There were no vitals taken for this visit.  Patient Information   Past Medical History:  Diagnosis Date  . Arthritis    hands left knee  . Depression   . Hypertension   . MMT (medial meniscus tear)    left knee      Past Surgical History:  Procedure Laterality Date  . ABDOMINAL HYSTERECTOMY    . BREAST BIOPSY Right 05/03/2012   stereo bx  . COLONOSCOPY WITH PROPOFOL N/A 09/21/2019   Procedure: COLONOSCOPY WITH PROPOFOL;  Surgeon: Jonathon Bellows, MD;  Location: [facility] ENDOSCOPY;  Service: Gastroenterology;  Laterality: N/A;  . KNEE ARTHROSCOPY Left 06/28/2019   Procedure: LEFT KNEE ARTHROSCOPY WITH PARTIAL MEDIAL MENISCECTOMY;  Surgeon: Mcarthur Rossetti, MD;  Location: [place];  Service: Orthopedics;  Laterality: Left;     Family History  Problem Relation Age of Onset  . Heart disease Mother   . Heart disease Father   . Diabetes Sister   . Diabetes Brother   . Diabetes Maternal Aunt   . Breast cancer Neg Hx     New Diagnoses (since last visit): N/A  Family Support: Good   Lifestyle Diet: Breakfast: sausage, egg, cereal  Lunch: sandwich, salad Dinner: chicken, veggies, beans Drinks: water, cranberry juice    Current Exercise Habits: Home exercise routine, Type of exercise: walking, Time (Minutes): 30, Frequency (Times/Week): 2, Weekly Exercise (Minutes/Week): 60, Intensity: Moderate       Social History   Substance and Sexual Activity  Alcohol Use Not Currently      Social History   Tobacco Use  Smoking Status Former Smoker  . Packs/day: 0.00  . Years: 45.00  . Pack years: 0.00  . Types: Cigarettes  . Quit date: 07/02/2019  . Years since quitting: 0.8   Smokeless Tobacco Never Used      Health Maintenance  Topic Date Due  . Hepatitis C Screening  Never done  . COVID-19 Vaccine (1) Never done  . HIV Screening  Never done  . TETANUS/TDAP  Never done  . PAP SMEAR-Modifier  Never done  . INFLUENZA VACCINE  Never done  . MAMMOGRAM  12/25/2021  . COLONOSCOPY (Pts 45-70yrs Insurance coverage will need to be confirmed)  09/21/2022   Health Maintenance/Date Completed  Last ED visit: 12/03/2019 Last Visit to PCP: 02/06/2020 Next Visit to PCP: 05/21/2020 Specialist Visit: 05/20/2020 - [facility] Exam: 2-3 mo ago Eye Exam: 2-3 mo ago Mammogram: few months ago Colonoscopy: 3-4 mo ago Flu Vaccine: due Pneumonia Vaccine: unknown COVID-19 Vaccine: completed primary series  *Pt states she is contemplating getting the other vaccines  Assessment and Plan: HTN Pt currently takes amlodipine 5mg  daily for HTN. Unable to assess BP as I am unable to obtain vitals due to telephone visit and pt does not have a cuff at home to assess. Pt quit smoking on 07/02/2019. She does not c/o any episodes of hypotension. Based on diet and exercise, pt is on the right track to keep her BP controlled. She has an appt with PCP on 05/21/2020. Continue current regimen.   HLD Patient takes simvastatin 10mg  daily for HLD. Most recent labs were from 10/2019 and were WNL. Continue current regimen and consider repeat lipid panel in 6-12 months.  Arthritis Pt has arthritis is her hands however does not take any medications for the pain. She states it is not very bothersome and does a few hand exercises from time to time and is able to tolerate. Encouraged pt to inform PCP if pain worsens or becomes more frequent.    Depression Pt currently takes sertraline and states she does not have any changes in mood or behavior. She has an appt with behavioral health on 05/20/20. Continue current regimen and reach out to [facility] or crisis line if symptoms worsen.   Access/Adherence Pt does  not use a pill box, however when asked about her adherence she states "I just remember". Pt stated she is running low on simvastatin and requests a refill - will put refill request in.    Sherilyn Banker, PharmD Pharmacy Resident  05/15/2020 12:28 PM

## 2020-05-16 ENCOUNTER — Other Ambulatory Visit: Payer: Self-pay | Admitting: Gerontology

## 2020-05-16 DIAGNOSIS — E785 Hyperlipidemia, unspecified: Secondary | ICD-10-CM

## 2020-05-20 ENCOUNTER — Ambulatory Visit: Payer: Self-pay | Admitting: Licensed Clinical Social Worker

## 2020-05-21 ENCOUNTER — Other Ambulatory Visit: Payer: Self-pay | Admitting: Gerontology

## 2020-05-21 ENCOUNTER — Other Ambulatory Visit: Payer: Self-pay

## 2020-05-21 ENCOUNTER — Ambulatory Visit: Payer: Self-pay | Admitting: Gerontology

## 2020-05-21 DIAGNOSIS — E785 Hyperlipidemia, unspecified: Secondary | ICD-10-CM

## 2020-05-21 DIAGNOSIS — I10 Essential (primary) hypertension: Secondary | ICD-10-CM

## 2020-05-21 DIAGNOSIS — Z0000 Encounter for general adult medical examination without abnormal findings: Secondary | ICD-10-CM

## 2020-05-21 MED ORDER — AMLODIPINE BESYLATE 5 MG PO TABS: 5.0000 mg | ORAL_TABLET | Freq: Every day | ORAL | 4 refills | Status: DC

## 2020-05-21 MED ORDER — ASPIRIN 81 MG PO CHEW: 81.0000 mg | CHEWABLE_TABLET | Freq: Every day | ORAL | 4 refills | Status: DC

## 2020-05-21 MED ORDER — SIMVASTATIN 10 MG PO TABS: 10.0000 mg | ORAL_TABLET | Freq: Every evening | ORAL | 4 refills | Status: DC

## 2020-05-21 NOTE — Progress Notes (Signed)
Established Patient Office Visit  Subjective:  Patient ID: Elaine Robinson, female    DOB: 16-Nov-1955  Age: 65 y.o. MRN: [id number]  CC: No chief complaint on file. Patient consents to telephone visit and 2 patient identifiers was used to identify patient.  HPI Elaine Robinson presents for follow up of hypertension and medication refill. She states that she's compliant with her medications, doesn't check her blood pressure at home and continues to make healthy lifestyle changes. She states that her dizzy spells has resolved. She had Low dose Chest CT scan  Screening on 04/30/2020 and it showed Lung-RADS 2, benign appearance or behavior. Continue annual screening with low-dose chest CT without contrast in 12 months. 2.  Emphysema and Aortic Atherosclerosis per Dr Tery Sanfilippo E. She quit smoking more than 9 months ago. She denies chest pain, palpitation, light headedness and shortness of breath. Overall, she states that she's doing well and offers no further complaint.  Past Medical History:  Diagnosis Date  . Arthritis    hands left knee  . Depression   . Hypertension   . MMT (medial meniscus tear)    left knee    Past Surgical History:  Procedure Laterality Date  . ABDOMINAL HYSTERECTOMY    . BREAST BIOPSY Right 05/03/2012   stereo bx  . COLONOSCOPY WITH PROPOFOL N/A 09/21/2019   Procedure: COLONOSCOPY WITH PROPOFOL;  Surgeon: Jonathon Bellows, MD;  Location: [facility] ENDOSCOPY;  Service: Gastroenterology;  Laterality: N/A;  . KNEE ARTHROSCOPY Left 06/28/2019   Procedure: LEFT KNEE ARTHROSCOPY WITH PARTIAL MEDIAL MENISCECTOMY;  Surgeon: Mcarthur Rossetti, MD;  Location: [place];  Service: Orthopedics;  Laterality: Left;    Family History  Problem Relation Age of Onset  . Heart disease Mother   . Heart disease Father   . Diabetes Sister   . Diabetes Brother   . Diabetes Maternal Aunt   . Breast cancer Neg Hx     Social History   Socioeconomic History  . Marital  status: Widowed    Spouse name: Not on file  . Number of children: 1  . Years of education: 11th grade  . Highest education level: 11th grade  Occupational History  . Occupation: unemployed  Tobacco Use  . Smoking status: Former Smoker    Packs/day: 0.00    Years: 45.00    Pack years: 0.00    Types: Cigarettes    Quit date: 07/02/2019    Years since quitting: 0.8  . Smokeless tobacco: Never Used  Vaping Use  . Vaping Use: Never used  Substance and Sexual Activity  . Alcohol use: Not Currently  . Drug use: Not Currently  . Sexual activity: Not Currently    Birth control/protection: None, Surgical  Other Topics Concern  . Not on file  Social History Narrative   Social Determinants screening completed on 07/10/2019. Patient does not require a referral to [facility] [place] [facility] at this point in time.    Social Determinants of Health   Financial Resource Strain: Low Risk   . Difficulty of Paying Living Expenses: Not very hard  Food Insecurity: No Food Insecurity  . Worried About Charity fundraiser in the Last Year: Never true  . Ran Out of Food in the Last Year: Never true  Transportation Needs: No Transportation Needs  . Lack of Transportation (Medical): No  . Lack of Transportation (Non-Medical): No  Physical Activity: Sufficiently Active  . Days of Exercise per Week: 5 days  . Minutes of Exercise per  Session: 60 min  Stress: No Stress Concern Present  . Feeling of Stress : Only a little  Social Connections: Moderately Integrated  . Frequency of Communication with Friends and Family: More than three times a week  . Frequency of Social Gatherings with Friends and Family: More than three times a week  . Attends Religious Services: More than 4 times per year  . Active Member of Clubs or Organizations: Yes  . Attends Archivist Meetings: More than 4 times per year  . Marital Status: Widowed  Intimate Partner Violence: Not At Risk  . Fear of Current or Ex-Partner: No  .  Emotionally Abused: No  . Physically Abused: No  . Sexually Abused: No    Outpatient Medications Prior to Visit  Medication Sig Dispense Refill  . valACYclovir (VALTREX) 500 MG tablet Take 1 tablet (500 mg total) by mouth daily. 30 tablet 2  . amLODipine (NORVASC) 5 MG tablet TAKE ONE TABLET BY MOUTH EVERY DAY 30 tablet 0  . aspirin 81 MG chewable tablet Chew 81 mg by mouth daily.    . simvastatin (ZOCOR) 10 MG tablet TAKE ONE TABLET BY MOUTH EVERY EVENING 30 tablet 0  . sertraline (ZOLOFT) 100 MG tablet Take 1 tablet (100 mg total) by mouth daily. 30 tablet 2   No facility-administered medications prior to visit.    Allergies  Allergen Reactions  . Lisinopril Cough    ROS Review of Systems  Constitutional: Negative.   Respiratory: Negative.   Cardiovascular: Negative.   Neurological: Negative.   Psychiatric/Behavioral: Negative.       Objective:    Physical Exam No physical exam was done There were no vitals taken for this visit. Wt Readings from Last 3 Encounters:  04/30/20 194 lb (88 kg)  02/06/20 192 lb 14.4 oz (87.5 kg)  01/09/20 192 lb (87.1 kg)     Health Maintenance Due  Topic Date Due  . Hepatitis C Screening  Never done  . COVID-19 Vaccine (1) Never done  . HIV Screening  Never done  . TETANUS/TDAP  Never done  . PAP SMEAR-Modifier  Never done  . INFLUENZA VACCINE  Never done    There are no preventive care reminders to display for this patient.  Lab Results  Component Value Date   TSH 1.940 12/27/2018   Lab Results  Component Value Date   WBC 4.2 11/07/2019   HGB 12.4 11/07/2019   HCT 37.7 11/07/2019   MCV 89 11/07/2019   PLT 257 11/07/2019   Lab Results  Component Value Date   NA 140 11/07/2019   K 4.3 11/07/2019   CO2 21 11/07/2019   GLUCOSE 68 11/07/2019   BUN 12 11/07/2019   CREATININE 0.73 11/07/2019   BILITOT 0.3 11/07/2019   ALKPHOS 70 11/07/2019   AST 14 11/07/2019   ALT 12 11/07/2019   PROT 7.3 11/07/2019   ALBUMIN  4.3 11/07/2019   CALCIUM 9.3 11/07/2019   Lab Results  Component Value Date   CHOL 158 11/07/2019   Lab Results  Component Value Date   HDL 52 11/07/2019   Lab Results  Component Value Date   LDLCALC 93 11/07/2019   Lab Results  Component Value Date   TRIG 67 11/07/2019   Lab Results  Component Value Date   CHOLHDL 3.0 11/07/2019   Lab Results  Component Value Date   HGBA1C 5.6 11/07/2019      Assessment & Plan:   1. Essential hypertension - She will continue  on current medication, and DASH diet. - amLODipine (NORVASC) 5 MG tablet; Take 1 tablet (5 mg total) by mouth daily.  Dispense: 30 tablet; Refill: 4 - aspirin 81 MG chewable tablet; Chew 1 tablet (81 mg total) by mouth daily.  Dispense: 30 tablet; Refill: 4  2. Elevated lipids - She will continue on current treatment regimen, low fat/cholesterol diet. - simvastatin (ZOCOR) 10 MG tablet; Take 1 tablet (10 mg total) by mouth every evening.  Dispense: 30 tablet; Refill: 4 - Lipid panel; Future  3. Healthcare maintenance - Routine labs will be rechecked. - Comp Met (CMET); Future - CBC w/Diff; Future - HgB A1c; Future - Urinalysis; Future     Follow-up: Return in about 12 weeks (around 08/13/2020), or if symptoms worsen or fail to improve.    Travonte Byard Jerold Coombe, NP

## 2020-05-21 NOTE — Patient Instructions (Signed)

## 2020-06-03 ENCOUNTER — Other Ambulatory Visit: Payer: Self-pay | Admitting: Gerontology

## 2020-06-03 DIAGNOSIS — F411 Generalized anxiety disorder: Secondary | ICD-10-CM

## 2020-06-03 MED ORDER — SERTRALINE HCL 100 MG PO TABS: 100.0000 mg | ORAL_TABLET | Freq: Every day | ORAL | 2 refills | Status: DC

## 2020-06-10 ENCOUNTER — Ambulatory Visit: Payer: Self-pay | Admitting: Licensed Clinical Social Worker

## 2020-06-10 ENCOUNTER — Other Ambulatory Visit: Payer: Self-pay

## 2020-06-10 DIAGNOSIS — F331 Major depressive disorder, recurrent, moderate: Secondary | ICD-10-CM

## 2020-06-10 DIAGNOSIS — F411 Generalized anxiety disorder: Secondary | ICD-10-CM

## 2020-06-10 NOTE — BH Specialist Note (Signed)
MRN: [id number] Name: Elaine Robinson  Total time: 20 minutes  Types of Service: [place] (BHI)  Interpretor:No. Interpretor Name and Language:  Subjective: Elaine Robinson is a 65 y.o. female accompanied by herself Patient was referred by Carlyon Shadow NP for mental health Patient reports the following symptoms/concerns: The patient notes that she is doing overall very well. She states that she has to end the session early today because she has to go to her bible study meeting tonight. The patient reports that she has been working some and enjoys keeping her mind busy. She reported that she had a crying spell this morning and that she has experienced depression her whole life. She noted that she is just sensitive and things hurt her heart easily. The patient stated  that she cried several times a week for no reason. She explained that she would like to talk more about this during her next session.  She asked if clinician would message her primary care provider to see if she could take regular baby Asprin instead of chewables. The patient denied any suicidal or homicidal thoughts.  Duration of problem:; Severity of problem: moderate  Objective: Mood: Euthymic and Affect: Appropriate Risk of harm to self or others: No plan to harm self or others  Life Context: Family and Social: see above  School/Work: see above Self-Care: see above Life Changes: see above  Patient and/or Family's Strengths/Protective Factors: Concrete supports in place (healthy food, safe environments, etc.)  Goals Addressed: Patient will: 1.  Reduce symptoms of: anxiety, depression and stress  2.  Increase knowledge and/or ability of: coping skills, healthy habits and stress reduction  3.  Demonstrate ability to: Increase healthy adjustment to current life circumstances  Progress towards Goals: Ongoing  Interventions: Interventions utilized:  Supportive Counseling was utilized by the  clinician during today's follow up session. The clinician processed with the patient how they have been doing since the last follow-up session. The clinician provided a space for the patient to ventilate their frustrations regarding their current life circumstances. Clinician measured the patient's anxiety and depression on a numerical scale. Clinician encouraged the patient to contact the clinic for an earlier appointment if she felt like her symptoms of depression were worsening. Clinician encouraged the patient to take their mediation at the same time everyday exactly as prescribed for it to reach it's full intended effect and agreed to message her provider regarding her question about baby Asprin.    Standardized Assessments completed: GAD-7 and PHQ 9  GAD-7     8 PHQ-9   12   Assessment: Patient currently experiencing see above   Patient may benefit from see above  Plan: 1. Follow up with behavioral health clinician on : 07/08/2020 at 6:00 PM  2. Behavioral recommendations:  3. Referral(s): [place] (In Clinic) 4. "From scale of 1-10, how likely are you to follow plan?":   Lesli Albee, Student-Social Work

## 2020-07-08 ENCOUNTER — Other Ambulatory Visit: Payer: Self-pay

## 2020-07-08 ENCOUNTER — Ambulatory Visit: Payer: Self-pay | Admitting: Licensed Clinical Social Worker

## 2020-07-08 ENCOUNTER — Telehealth: Payer: Self-pay | Admitting: Licensed Clinical Social Worker

## 2020-07-08 DIAGNOSIS — F331 Major depressive disorder, recurrent, moderate: Secondary | ICD-10-CM

## 2020-07-08 DIAGNOSIS — F411 Generalized anxiety disorder: Secondary | ICD-10-CM

## 2020-07-08 NOTE — BH Specialist Note (Signed)
Integrated Behavioral Health Follow Up In-Person Visit  MRN: [id number] Name: Elaine Robinson  Total time: 60 minutes  Types of Service: [place] (BHI)  Interpretor:No. Interpretor Name and Language:   Patient consents to telephone visit and 2 patient identifiers were used to identify patient  Subjective: Elaine Robinson is a 65 y.o. female accompanied by herself  Patient was referred by Elaine Shadow, NP  for mental health. Patient reports the following symptoms/concerns: The patient stated that she has been doing alright since her last follow-up session. She notes that she has been working and keeping her mind and body busy which she feels helps her to deal with her symptoms of depression and anxiety. She stated that she enjoys working because it gives her a sense of purpose and keeps her mind sharpe. She noted that she has become more social and enjoys her Bible study meetings and visiting with friends. The patient denied any suicidal or homicidal thoughts.  Duration of problem: ; Severity of problem: moderate  Objective: Mood: Euthymic and Affect: Appropriate Risk of harm to self or others: No plan to harm self or others  Life Context: Family and Social: see above School/Work: see above Self-Care: see above Life Changes: see above  Patient and/or Family's Strengths/Protective Factors: Concrete supports in place (healthy food, safe environments, etc.)  Goals Addressed: Patient will: 1.  Reduce symptoms of: anxiety and depression  2.  Increase knowledge and/or ability of: coping skills, healthy habits and self-management skills  3.  Demonstrate ability to: Increase healthy adjustment to current life circumstances  Progress towards Goals: Ongoing  Interventions: Interventions utilized:  Supportive Counseling was utilized by the clinician during today's follow up session. The clinician processed with the patient how they have been doing since the  last follow-up session. The clinician provided a space for the patient to ventilate their frustrations regarding their current life circumstances. Clinician measured the patient's anxiety and depression on a numerical scale. Clinician congratulated the patient for doing so well since the last follow-up visit. Clinician explored with the patient their perspective on what was working well in managing her anxiety and depression symptoms. Clinician encouraged the patient to continue to utilize her coping skills to deal with her current life circumstances.   Standardized Assessments completed: GAD-7 and PHQ 9 GAD-7       7 PHQ-9     12   Patient currently experiencing see above  Patient may benefit from see above  Plan: 1. Follow up with behavioral health clinician on : 08/19/2020 @ 5:30 PM  2. Behavioral recommendations:  3. Referral(s): [place] (In Clinic) 4. "From scale of 1-10, how likely are you to follow plan?":   Elaine Robinson, Student-Social Work

## 2020-07-10 ENCOUNTER — Other Ambulatory Visit: Payer: Self-pay

## 2020-07-11 ENCOUNTER — Other Ambulatory Visit: Payer: Self-pay

## 2020-07-11 ENCOUNTER — Ambulatory Visit
Admission: RE | Admit: 2020-07-11 | Discharge: 2020-07-11 | Disposition: A | Discharge status: Home / Self Care | Payer: Self-pay | Source: Ambulatory Visit | Attending: Oncology | Admitting: Oncology

## 2020-07-11 DIAGNOSIS — R920 Mammographic microcalcification found on diagnostic imaging of breast: Secondary | ICD-10-CM | POA: Insufficient documentation

## 2020-07-14 ENCOUNTER — Other Ambulatory Visit: Payer: Self-pay | Admitting: *Deleted

## 2020-07-14 ENCOUNTER — Encounter: Payer: Self-pay | Admitting: *Deleted

## 2020-07-14 DIAGNOSIS — R920 Mammographic microcalcification found on diagnostic imaging of breast: Secondary | ICD-10-CM

## 2020-07-14 NOTE — Progress Notes (Signed)
Letter mailed to inform patient of her next mammogram and BCCCP appointment on 01/13/21 @ 9:00 and 10:40 for her mammogram.

## 2020-07-28 ENCOUNTER — Other Ambulatory Visit: Payer: Self-pay

## 2020-07-29 NOTE — Telephone Encounter (Signed)
Returned patient call, phone was busy unable to leave a voicemail.

## 2020-07-30 ENCOUNTER — Other Ambulatory Visit: Payer: Self-pay | Admitting: Adult Health

## 2020-07-30 DIAGNOSIS — B009 Herpesviral infection, unspecified: Secondary | ICD-10-CM

## 2020-08-01 ENCOUNTER — Other Ambulatory Visit: Payer: Self-pay

## 2020-08-04 ENCOUNTER — Other Ambulatory Visit: Payer: Self-pay

## 2020-08-05 ENCOUNTER — Other Ambulatory Visit: Payer: Self-pay

## 2020-08-06 ENCOUNTER — Other Ambulatory Visit: Payer: Self-pay

## 2020-08-06 ENCOUNTER — Other Ambulatory Visit: Payer: Self-pay | Admitting: Gerontology

## 2020-08-06 DIAGNOSIS — B009 Herpesviral infection, unspecified: Secondary | ICD-10-CM

## 2020-08-06 MED ORDER — VALACYCLOVIR HCL 500 MG PO TABS
ORAL_TABLET | Freq: Every day | ORAL | 2 refills | Status: DC
  Filled 2020-08-06: qty 30, 30d supply, fill #0
  Filled 2020-08-29: qty 4, 4d supply, fill #1
  Filled 2020-09-01: qty 30, 30d supply, fill #2
  Filled 2020-10-07: qty 26, 26d supply, fill #3

## 2020-08-07 ENCOUNTER — Other Ambulatory Visit: Payer: Self-pay

## 2020-08-13 ENCOUNTER — Ambulatory Visit: Payer: Self-pay | Admitting: Gerontology

## 2020-08-13 ENCOUNTER — Other Ambulatory Visit: Payer: Self-pay

## 2020-08-13 VITALS — BP 129/77 | HR 66 | Temp 97.4°F | Resp 16 | Wt 195.5 lb

## 2020-08-13 DIAGNOSIS — E785 Hyperlipidemia, unspecified: Secondary | ICD-10-CM

## 2020-08-13 DIAGNOSIS — M79645 Pain in left finger(s): Secondary | ICD-10-CM | POA: Insufficient documentation

## 2020-08-13 DIAGNOSIS — Z0000 Encounter for general adult medical examination without abnormal findings: Secondary | ICD-10-CM

## 2020-08-13 MED ORDER — NAPROXEN 500 MG PO TABS
250.0000 mg | ORAL_TABLET | Freq: Two times a day (BID) | ORAL | 0 refills | Status: DC
  Filled 2020-08-13 (×2): qty 10, 10d supply, fill #0

## 2020-08-13 NOTE — Progress Notes (Signed)
Established Patient Office Visit  Subjective:  Patient ID: Elaine Robinson, female    DOB: 1955-08-30  Age: 65 y.o. MRN: [id number]  CC: No chief complaint on file.   HPI [name] a 65 y/o female who has a history of Arthritis of hand and left knee , depression and hypertension presents for c/o non traumatic left wrist at the base of thumb pain that started 3 weeks ago.She also has mild swelling to medial aspect of left wrist. She describes pain as sharp, non radiating 4/10. She states that she experiences pain when gripping objects. She states that taking Ibuprofen moderately relieves symptoms. She denies muscle weakness, numbness and tingling. She missed her lab appointment, overall, she states that she's doing well and offers no further complaint.  Past Medical History:  Diagnosis Date  . Arthritis    hands left knee  . Depression   . Hypertension   . MMT (medial meniscus tear)    left knee    Past Surgical History:  Procedure Laterality Date  . ABDOMINAL HYSTERECTOMY    . BREAST BIOPSY Right 05/03/2012   stereo bx  . BREAST BIOPSY Left    benign-years ago per pt  . COLONOSCOPY WITH PROPOFOL N/A 09/21/2019   Procedure: COLONOSCOPY WITH PROPOFOL;  Surgeon: Jonathon Bellows, MD;  Location: [facility] ENDOSCOPY;  Service: Gastroenterology;  Laterality: N/A;  . KNEE ARTHROSCOPY Left 06/28/2019   Procedure: LEFT KNEE ARTHROSCOPY WITH PARTIAL MEDIAL MENISCECTOMY;  Surgeon: Mcarthur Rossetti, MD;  Location: [facility];  Service: Orthopedics;  Laterality: Left;    Family History  Problem Relation Age of Onset  . Heart disease Mother   . Heart disease Father   . Diabetes Sister   . Diabetes Brother   . Diabetes Maternal Aunt   . Breast cancer Neg Hx     Social History   Socioeconomic History  . Marital status: Widowed    Spouse name: Not on file  . Number of children: 1  . Years of education: 11th grade  . Highest education level: 11th grade  Occupational  History  . Occupation: unemployed  Tobacco Use  . Smoking status: Former Smoker    Packs/day: 0.00    Years: 45.00    Pack years: 0.00    Types: Cigarettes    Quit date: 07/02/2019    Years since quitting: 1.1  . Smokeless tobacco: Never Used  Vaping Use  . Vaping Use: Never used  Substance and Sexual Activity  . Alcohol use: Not Currently  . Drug use: Not Currently  . Sexual activity: Not Currently    Birth control/protection: None, Surgical  Other Topics Concern  . Not on file  Social History Narrative   Social Determinants screening completed on 07/10/2019. Patient does not require a referral to [facility] at this point in time.    Social Determinants of Health   Financial Resource Strain: Not on file  Food Insecurity: No Food Insecurity  . Worried About Charity fundraiser in the Last Year: Never true  . Ran Out of Food in the Last Year: Never true  Transportation Needs: Not on file  Physical Activity: Not on file  Stress: No Stress Concern Present  . Feeling of Stress : Only a little  Social Connections: Not on file  Intimate Partner Violence: Not on file    Outpatient Medications Prior to Visit  Medication Sig Dispense Refill  . amLODipine (NORVASC) 5 MG tablet TAKE ONE TABLET BY MOUTH EVERY DAY  30 tablet 4  . aspirin 81 MG chewable tablet CHEW ONE TABLET BY MOUTH EVERY DAY 30 tablet 4  . simvastatin (ZOCOR) 10 MG tablet TAKE ONE TABLET BY MOUTH EVERY EVENING 30 tablet 4  . valACYclovir (VALTREX) 500 MG tablet TAKE ONE TABLET BY MOUTH EVERY DAY 30 tablet 2  . sertraline (ZOLOFT) 100 MG tablet TAKE ONE TABLET BY MOUTH EVERY DAY 30 tablet 2   No facility-administered medications prior to visit.    Allergies  Allergen Reactions  . Lisinopril Cough    ROS Review of Systems  Constitutional: Negative.   Respiratory: Negative.   Cardiovascular: Negative.   Musculoskeletal: Positive for arthralgias (left wrist pain).  Neurological: Negative.       Objective:     Physical Exam Cardiovascular:     Rate and Rhythm: Normal rate and regular rhythm.     Pulses: Normal pulses.     Heart sounds: Normal heart sounds.  Pulmonary:     Effort: Pulmonary effort is normal.     Breath sounds: Normal breath sounds.  Musculoskeletal:     Left wrist: Swelling and tenderness present. No deformity, effusion or crepitus. Normal range of motion. Normal pulse.       Arms:  Skin:    General: Skin is warm.  Neurological:     General: No focal deficit present.     Mental Status: She is alert and oriented to person, place, and time. Mental status is at baseline.  Psychiatric:        Mood and Affect: Mood normal.        Behavior: Behavior normal.        Thought Content: Thought content normal.        Judgment: Judgment normal.     BP 129/77 (BP Location: Left Arm, Patient Position: Sitting, Cuff Size: Large)   Pulse 66   Temp (!) 97.4 F (36.3 C)   Resp 16   Wt 195 lb 8 oz (88.7 kg)   SpO2 97%   BMI 32.04 kg/m  Wt Readings from Last 3 Encounters:  08/13/20 195 lb 8 oz (88.7 kg)  04/30/20 194 lb (88 kg)  02/06/20 192 lb 14.4 oz (87.5 kg)    Weight loss encouraged  Health Maintenance Due  Topic Date Due  . Hepatitis C Screening  Never done  . COVID-19 Vaccine (1) Never done  . HIV Screening  Never done  . TETANUS/TDAP  Never done  . PAP SMEAR-Modifier  Never done    There are no preventive care reminders to display for this patient.  Lab Results  Component Value Date   TSH 1.940 12/27/2018   Lab Results  Component Value Date   WBC 4.2 11/07/2019   HGB 12.4 11/07/2019   HCT 37.7 11/07/2019   MCV 89 11/07/2019   PLT 257 11/07/2019   Lab Results  Component Value Date   NA 140 11/07/2019   K 4.3 11/07/2019   CO2 21 11/07/2019   GLUCOSE 68 11/07/2019   BUN 12 11/07/2019   CREATININE 0.73 11/07/2019   BILITOT 0.3 11/07/2019   ALKPHOS 70 11/07/2019   AST 14 11/07/2019   ALT 12 11/07/2019   PROT 7.3 11/07/2019   ALBUMIN 4.3  11/07/2019   CALCIUM 9.3 11/07/2019   Lab Results  Component Value Date   CHOL 158 11/07/2019   Lab Results  Component Value Date   HDL 52 11/07/2019   Lab Results  Component Value Date   LDLCALC 93 11/07/2019  Lab Results  Component Value Date   TRIG 67 11/07/2019   Lab Results  Component Value Date   CHOLHDL 3.0 11/07/2019   Lab Results  Component Value Date   HGBA1C 5.6 11/07/2019      Assessment & Plan:    1. Pain of left thumb - Positive Tinnel's  And Bonita Quin test. Possible Carpel Tunnel Syndrome or Tenosynovitis. She will continue on Naproxen bid, was educated on medication side effects and advised to notify clinic. She was encouraged to buy and wear a wrist brace . She was advised to go to the ED for worsening symptoms. - naproxen (NAPROSYN) 500 MG tablet; Take 0.5 tablets (250 mg total) by mouth 2 (two) times daily with a meal.  Dispense: 10 tablet; Refill: 0  2. Healthcare maintenance - Routine labs will be checked. - Urinalysis - HgB A1c - CBC w/Diff - Comp Met (CMET)  3. Elevated lipids - Routine lab will be checked. - Lipid panel   Follow-up: Return in about 15 days (around 08/28/2020), or if symptoms worsen or fail to improve.    Cortavious Nix Jerold Coombe, NP

## 2020-08-14 LAB — COMPREHENSIVE METABOLIC PANEL
ALT: 10 IU/L (ref 0–32)
AST: 11 IU/L (ref 0–40)
Albumin/Globulin Ratio: 1.8 (ref 1.2–2.2)
Albumin: 4.7 g/dL (ref 3.8–4.8)
Alkaline Phosphatase: 64 IU/L (ref 44–121)
BUN/Creatinine Ratio: 19 (ref 12–28)
BUN: 14 mg/dL (ref 8–27)
Bilirubin Total: 0.3 mg/dL (ref 0.0–1.2)
CO2: 24 mmol/L (ref 20–29)
Calcium: 9.4 mg/dL (ref 8.7–10.3)
Chloride: 102 mmol/L (ref 96–106)
Creatinine, Ser: 0.73 mg/dL (ref 0.57–1.00)
Globulin, Total: 2.6 g/dL (ref 1.5–4.5)
Glucose: 84 mg/dL (ref 65–99)
Potassium: 4.5 mmol/L (ref 3.5–5.2)
Sodium: 140 mmol/L (ref 134–144)
Total Protein: 7.3 g/dL (ref 6.0–8.5)
eGFR: 93 mL/min/{1.73_m2} (ref >59)

## 2020-08-14 LAB — CBC WITH DIFFERENTIAL/PLATELET
Basophils Absolute: 0 10*3/uL (ref 0.0–0.2)
Basos: 1 %
EOS (ABSOLUTE): 0.4 10*3/uL (ref 0.0–0.4)
Eos: 11 %
Hematocrit: 36.3 % (ref 34.0–46.6)
Hemoglobin: 12.2 g/dL (ref 11.1–15.9)
Immature Grans (Abs): 0 10*3/uL (ref 0.0–0.1)
Immature Granulocytes: 0 %
Lymphocytes Absolute: 1.2 10*3/uL (ref 0.7–3.1)
Lymphs: 34 %
MCH: 29.5 pg (ref 26.6–33.0)
MCHC: 33.6 g/dL (ref 31.5–35.7)
MCV: 88 fL (ref 79–97)
Monocytes Absolute: 0.4 10*3/uL (ref 0.1–0.9)
Monocytes: 10 %
Neutrophils Absolute: 1.6 10*3/uL (ref 1.4–7.0)
Neutrophils: 44 %
Platelets: 279 10*3/uL (ref 150–450)
RBC: 4.13 x10E6/uL (ref 3.77–5.28)
RDW: 12.9 % (ref 11.7–15.4)
WBC: 3.6 10*3/uL (ref 3.4–10.8)

## 2020-08-14 LAB — LIPID PANEL
Chol/HDL Ratio: 2.8 ratio (ref 0.0–4.4)
Cholesterol, Total: 159 mg/dL (ref 100–199)
HDL: 56 mg/dL (ref >39)
LDL Chol Calc (NIH): 89 mg/dL (ref 0–99)
Triglycerides: 72 mg/dL (ref 0–149)
VLDL Cholesterol Cal: 14 mg/dL (ref 5–40)

## 2020-08-14 LAB — HEMOGLOBIN A1C
Est. average glucose Bld gHb Est-mCnc: 120 mg/dL
Hgb A1c MFr Bld: 5.8 % — ABNORMAL HIGH (ref 4.8–5.6)

## 2020-08-14 LAB — URINALYSIS
Bilirubin, UA: NEGATIVE
Glucose, UA: NEGATIVE
Ketones, UA: NEGATIVE
Leukocytes,UA: NEGATIVE
Nitrite, UA: NEGATIVE
Protein,UA: NEGATIVE
RBC, UA: NEGATIVE
Specific Gravity, UA: 1.009 (ref 1.005–1.030)
Urobilinogen, Ur: 1 mg/dL (ref 0.2–1.0)
pH, UA: 7 (ref 5.0–7.5)

## 2020-08-18 ENCOUNTER — Other Ambulatory Visit: Payer: Self-pay

## 2020-08-19 ENCOUNTER — Ambulatory Visit: Payer: Self-pay | Admitting: Licensed Clinical Social Worker

## 2020-08-19 ENCOUNTER — Telehealth: Payer: Self-pay | Admitting: Licensed Clinical Social Worker

## 2020-08-19 NOTE — Telephone Encounter (Signed)
Called the patient twice during today's scheduled appointment. No answer,mailbox was not setup; unable to leave voicemail

## 2020-08-21 ENCOUNTER — Other Ambulatory Visit: Payer: Self-pay

## 2020-08-21 MED FILL — Simvastatin Tab 10 MG: ORAL | 30 days supply | Qty: 30 | Fill #0 | Status: AC

## 2020-08-21 MED FILL — Aspirin Chew Tab 81 MG: ORAL | 30 days supply | Qty: 30 | Fill #0 | Status: AC

## 2020-08-25 ENCOUNTER — Other Ambulatory Visit: Payer: Self-pay

## 2020-08-25 MED FILL — Sertraline HCl Tab 100 MG: ORAL | 30 days supply | Qty: 30 | Fill #0 | Status: AC

## 2020-08-28 ENCOUNTER — Other Ambulatory Visit: Payer: Self-pay

## 2020-08-28 ENCOUNTER — Encounter: Payer: Self-pay | Admitting: Family Medicine

## 2020-08-28 ENCOUNTER — Encounter: Payer: Self-pay | Admitting: Gerontology

## 2020-08-28 ENCOUNTER — Ambulatory Visit: Payer: Self-pay | Admitting: Gerontology

## 2020-08-28 ENCOUNTER — Ambulatory Visit: Payer: Self-pay | Admitting: Family Medicine

## 2020-08-28 VITALS — BP 122/85 | HR 71 | Temp 97.2°F | Resp 16 | Ht 65.5 in | Wt 197.3 lb

## 2020-08-28 DIAGNOSIS — E785 Hyperlipidemia, unspecified: Secondary | ICD-10-CM

## 2020-08-28 DIAGNOSIS — B009 Herpesviral infection, unspecified: Secondary | ICD-10-CM

## 2020-08-28 DIAGNOSIS — I10 Essential (primary) hypertension: Secondary | ICD-10-CM

## 2020-08-28 DIAGNOSIS — R7303 Prediabetes: Secondary | ICD-10-CM

## 2020-08-28 DIAGNOSIS — Z8619 Personal history of other infectious and parasitic diseases: Secondary | ICD-10-CM

## 2020-08-28 MED ORDER — SIMVASTATIN 10 MG PO TABS
ORAL_TABLET | Freq: Every evening | ORAL | 5 refills | Status: DC
  Filled 2020-08-28: qty 30, fill #0
  Filled 2020-09-23: qty 30, 30d supply, fill #0
  Filled 2020-10-23: qty 30, 30d supply, fill #1
  Filled 2020-11-24: qty 30, 30d supply, fill #2
  Filled 2020-12-25: qty 30, 30d supply, fill #3
  Filled 2021-01-20: qty 30, 30d supply, fill #4
  Filled 2021-02-16: qty 30, 30d supply, fill #5

## 2020-08-28 MED ORDER — ASPIRIN 81 MG PO CHEW
CHEWABLE_TABLET | ORAL | 4 refills | Status: DC
  Filled 2020-08-28: qty 30, fill #0
  Filled 2020-09-23: qty 30, 30d supply, fill #0
  Filled 2020-10-23: qty 30, 30d supply, fill #1
  Filled 2020-11-24: qty 30, 30d supply, fill #2
  Filled 2020-12-25: qty 30, 30d supply, fill #3
  Filled 2021-01-20: qty 30, 30d supply, fill #4

## 2020-08-28 MED ORDER — AMLODIPINE BESYLATE 5 MG PO TABS
ORAL_TABLET | Freq: Every day | ORAL | 5 refills | Status: DC
  Filled 2020-08-28 – 2020-10-07 (×3): qty 30, 30d supply, fill #0
  Filled 2020-10-24 – 2020-11-10 (×2): qty 30, 30d supply, fill #1
  Filled 2020-12-09: qty 30, 30d supply, fill #2
  Filled 2021-01-05 – 2021-01-06 (×2): qty 30, 30d supply, fill #3
  Filled 2021-02-03: qty 30, 30d supply, fill #4

## 2020-08-28 NOTE — Patient Instructions (Addendum)
https://www.nhlbi.nih.gov/files/docs/public/heart/dash_brief.pdf">  DASH Eating Plan DASH stands for Dietary Approaches to Stop Hypertension. The DASH eating plan is a healthy eating plan that has been shown to:  Reduce high blood pressure (hypertension).  Reduce your risk for type 2 diabetes, heart disease, and stroke.  Help with weight loss. What are tips for following this plan? Reading food labels  Check food labels for the amount of salt (sodium) per serving. Choose foods with less than 5 percent of the Daily Value of sodium. Generally, foods with less than 300 milligrams (mg) of sodium per serving fit into this eating plan.  To find whole grains, look for the word "whole" as the first word in the ingredient list. Shopping  Buy products labeled as "low-sodium" or "no salt added."  Buy fresh foods. Avoid canned foods and pre-made or frozen meals. Cooking  Avoid adding salt when cooking. Use salt-free seasonings or herbs instead of table salt or sea salt. Check with your health care provider or pharmacist before using salt substitutes.  Do not fry foods. Cook foods using healthy methods such as baking, boiling, grilling, roasting, and broiling instead.  Cook with heart-healthy oils, such as olive, canola, avocado, soybean, or sunflower oil. Meal planning  Eat a balanced diet that includes: ? 4 or more servings of fruits and 4 or more servings of vegetables each day. Try to fill one-half of your plate with fruits and vegetables. ? 6-8 servings of whole grains each day. ? Less than 6 oz (170 g) of lean meat, poultry, or fish each day. A 3-oz (85-g) serving of meat is about the same size as a deck of cards. One egg equals 1 oz (28 g). ? 2-3 servings of low-fat dairy each day. One serving is 1 cup (237 mL). ? 1 serving of nuts, seeds, or beans 5 times each week. ? 2-3 servings of heart-healthy fats. Healthy fats called omega-3 fatty acids are found in foods such as walnuts,  flaxseeds, fortified milks, and eggs. These fats are also found in cold-water fish, such as sardines, salmon, and mackerel.  Limit how much you eat of: ? Canned or prepackaged foods. ? Food that is high in trans fat, such as some fried foods. ? Food that is high in saturated fat, such as fatty meat. ? Desserts and other sweets, sugary drinks, and other foods with added sugar. ? Full-fat dairy products.  Do not salt foods before eating.  Do not eat more than 4 egg yolks a week.  Try to eat at least 2 vegetarian meals a week.  Eat more home-cooked food and less restaurant, buffet, and fast food.   Lifestyle  When eating at a restaurant, ask that your food be prepared with less salt or no salt, if possible.  If you drink alcohol: ? Limit how much you use to:  0-1 drink a day for women who are not pregnant.  0-2 drinks a day for men. ? Be aware of how much alcohol is in your drink. In the [place] one drink equals one 12 oz bottle of beer (355 mL), one 5 oz glass of wine (148 mL), or one 1 oz glass of hard liquor (44 mL). General information  Avoid eating more than 2,300 mg of salt a day. If you have hypertension, you may need to reduce your sodium intake to 1,500 mg a day.  Work with your health care provider to maintain a healthy body weight or to lose weight. Ask what an ideal weight is for   you.  Get at least 30 minutes of exercise that causes your heart to beat faster (aerobic exercise) most days of the week. Activities may include walking, swimming, or biking.  Work with your health care provider or dietitian to adjust your eating plan to your individual calorie needs. What foods should I eat? Fruits All fresh, dried, or frozen fruit. Canned fruit in natural juice (without added sugar). Vegetables Fresh or frozen vegetables (raw, steamed, roasted, or grilled). Low-sodium or reduced-sodium tomato and vegetable juice. Low-sodium or reduced-sodium tomato sauce and tomato paste.  Low-sodium or reduced-sodium canned vegetables. Grains Whole-grain or whole-wheat bread. Whole-grain or whole-wheat pasta. Brown rice. Oatmeal. Quinoa. Bulgur. Whole-grain and low-sodium cereals. Pita bread. Low-fat, low-sodium crackers. Whole-wheat flour tortillas. Meats and other proteins Skinless chicken or turkey. Ground chicken or turkey. Pork with fat trimmed off. Fish and seafood. Egg whites. Dried beans, peas, or lentils. Unsalted nuts, nut butters, and seeds. Unsalted canned beans. Lean cuts of beef with fat trimmed off. Low-sodium, lean precooked or cured meat, such as sausages or meat loaves. Dairy Low-fat (1%) or fat-free (skim) milk. Reduced-fat, low-fat, or fat-free cheeses. Nonfat, low-sodium ricotta or cottage cheese. Low-fat or nonfat yogurt. Low-fat, low-sodium cheese. Fats and oils Soft margarine without trans fats. Vegetable oil. Reduced-fat, low-fat, or light mayonnaise and salad dressings (reduced-sodium). Canola, safflower, olive, avocado, soybean, and sunflower oils. Avocado. Seasonings and condiments Herbs. Spices. Seasoning mixes without salt. Other foods Unsalted popcorn and pretzels. Fat-free sweets. The items listed above may not be a complete list of foods and beverages you can eat. Contact a dietitian for more information. What foods should I avoid? Fruits Canned fruit in a light or heavy syrup. Fried fruit. Fruit in cream or butter sauce. Vegetables Creamed or fried vegetables. Vegetables in a cheese sauce. Regular canned vegetables (not low-sodium or reduced-sodium). Regular canned tomato sauce and paste (not low-sodium or reduced-sodium). Regular tomato and vegetable juice (not low-sodium or reduced-sodium). Pickles. Olives. Grains Baked goods made with fat, such as croissants, muffins, or some breads. Dry pasta or rice meal packs. Meats and other proteins Fatty cuts of meat. Ribs. Fried meat. Bacon. Bologna, salami, and other precooked or cured meats, such as  sausages or meat loaves. Fat from the back of a pig (fatback). Bratwurst. Salted nuts and seeds. Canned beans with added salt. Canned or smoked fish. Whole eggs or egg yolks. Chicken or turkey with skin. Dairy Whole or 2% milk, cream, and half-and-half. Whole or full-fat cream cheese. Whole-fat or sweetened yogurt. Full-fat cheese. Nondairy creamers. Whipped toppings. Processed cheese and cheese spreads. Fats and oils Butter. Stick margarine. Lard. Shortening. Ghee. Bacon fat. Tropical oils, such as coconut, palm kernel, or palm oil. Seasonings and condiments Onion salt, garlic salt, seasoned salt, table salt, and sea salt. Worcestershire sauce. Tartar sauce. Barbecue sauce. Teriyaki sauce. Soy sauce, including reduced-sodium. Steak sauce. Canned and packaged gravies. Fish sauce. Oyster sauce. Cocktail sauce. Store-bought horseradish. Ketchup. Mustard. Meat flavorings and tenderizers. Bouillon cubes. Hot sauces. Pre-made or packaged marinades. Pre-made or packaged taco seasonings. Relishes. Regular salad dressings. Other foods Salted popcorn and pretzels. The items listed above may not be a complete list of foods and beverages you should avoid. Contact a dietitian for more information. Where to find more information  National Heart, Lung, and Blood Institute: www.nhlbi.nih.gov  American Heart Association: www.heart.org  Academy of Nutrition and Dietetics: www.eatright.org  National Kidney Foundation: www.kidney.org Summary  The DASH eating plan is a healthy eating plan that has been shown to   reduce high blood pressure (hypertension). It may also reduce your risk for type 2 diabetes, heart disease, and stroke.  When on the DASH eating plan, aim to eat more fresh fruits and vegetables, whole grains, lean proteins, low-fat dairy, and heart-healthy fats.  With the DASH eating plan, you should limit salt (sodium) intake to 2,300 mg a day. If you have hypertension, you may need to reduce your  sodium intake to 1,500 mg a day.  Work with your health care provider or dietitian to adjust your eating plan to your individual calorie needs. This information is not intended to replace advice given to you by your health care provider. Make sure you discuss any questions you have with your health care provider. Document Revised: 03/16/2019 Document Reviewed: 03/16/2019 Elsevier Patient Education  2021 Elsevier Inc.  Prediabetes Eating Plan Prediabetes is a condition that causes blood sugar (glucose) levels to be higher than normal. This increases the risk for developing type 2 diabetes (type 2 diabetes mellitus). Working with a health care provider or nutrition specialist (dietitian) to make diet and lifestyle changes can help prevent the onset of diabetes. These changes may help you:  Control your blood glucose levels.  Improve your cholesterol levels.  Manage your blood pressure. What are tips for following this plan? Reading food labels  Read food labels to check the amount of fat, salt (sodium), and sugar in prepackaged foods. Avoid foods that have: ? Saturated fats. ? Trans fats. ? Added sugars.  Avoid foods that have more than 300 milligrams (mg) of sodium per serving. Limit your sodium intake to less than 2,300 mg each day. Shopping  Avoid buying pre-made and processed foods.  Avoid buying drinks with added sugar. Cooking  Cook with olive oil. Do not use butter, lard, or ghee.  Bake, broil, grill, steam, or boil foods. Avoid frying. Meal planning  Work with your dietitian to create an eating plan that is right for you. This may include tracking how many calories you take in each day. Use a food diary, notebook, or mobile application to track what you eat at each meal.  Consider following a Mediterranean diet. This includes: ? Eating several servings of fresh fruits and vegetables each day. ? Eating fish at least twice a week. ? Eating one serving each day of whole  grains, beans, nuts, and seeds. ? Using olive oil instead of other fats. ? Limiting alcohol. ? Limiting red meat. ? Using nonfat or low-fat dairy products.  Consider following a plant-based diet. This includes dietary choices that focus on eating mostly vegetables and fruit, grains, beans, nuts, and seeds.  If you have high blood pressure, you may need to limit your sodium intake or follow a diet such as the DASH (Dietary Approaches to Stop Hypertension) eating plan. The DASH diet aims to lower high blood pressure.   Lifestyle  Set weight loss goals with help from your health care team. It is recommended that most people with prediabetes lose 7% of their body weight.  Exercise for at least 30 minutes 5 or more days a week.  Attend a support group or seek support from a mental health counselor.  Take over-the-counter and prescription medicines only as told by your health care provider. What foods are recommended? Fruits Berries. Bananas. Apples. Oranges. Grapes. Papaya. Mango. Pomegranate. Kiwi. Grapefruit. Cherries. Vegetables Lettuce. Spinach. Peas. Beets. Cauliflower. Cabbage. Broccoli. Carrots. Tomatoes. Squash. Eggplant. Herbs. Peppers. Onions. Cucumbers. Brussels sprouts. Grains Whole grains, such as whole-wheat or whole-grain breads,   crackers, cereals, and pasta. Unsweetened oatmeal. Bulgur. Barley. Quinoa. Brown rice. Corn or whole-wheat flour tortillas or taco shells. Meats and other proteins Seafood. Poultry without skin. Lean cuts of pork and beef. Tofu. Eggs. Nuts. Beans. Dairy Low-fat or fat-free dairy products, such as yogurt, cottage cheese, and cheese. Beverages Water. Tea. Coffee. Sugar-free or diet soda. Seltzer water. Low-fat or nonfat milk. Milk alternatives, such as soy or almond milk. Fats and oils Olive oil. Canola oil. Sunflower oil. Grapeseed oil. Avocado. Walnuts. Sweets and desserts Sugar-free or low-fat pudding. Sugar-free or low-fat ice cream and other  frozen treats. Seasonings and condiments Herbs. Sodium-free spices. Mustard. Relish. Low-salt, low-sugar ketchup. Low-salt, low-sugar barbecue sauce. Low-fat or fat-free mayonnaise. The items listed above may not be a complete list of recommended foods and beverages. Contact a dietitian for more information. What foods are not recommended? Fruits Fruits canned with syrup. Vegetables Canned vegetables. Frozen vegetables with butter or cream sauce. Grains Refined white flour and flour products, such as bread, pasta, snack foods, and cereals. Meats and other proteins Fatty cuts of meat. Poultry with skin. Breaded or fried meat. Processed meats. Dairy Full-fat yogurt, cheese, or milk. Beverages Sweetened drinks, such as iced tea and soda. Fats and oils Butter. Lard. Ghee. Sweets and desserts Baked goods, such as cake, cupcakes, pastries, cookies, and cheesecake. Seasonings and condiments Spice mixes with added salt. Ketchup. Barbecue sauce. Mayonnaise. The items listed above may not be a complete list of foods and beverages that are not recommended. Contact a dietitian for more information. Where to find more information  American Diabetes Association: www.diabetes.org Summary  You may need to make diet and lifestyle changes to help prevent the onset of diabetes. These changes can help you control blood sugar, improve cholesterol levels, and manage blood pressure.  Set weight loss goals with help from your health care team. It is recommended that most people with prediabetes lose 7% of their body weight.  Consider following a Mediterranean diet. This includes eating plenty of fresh fruits and vegetables, whole grains, beans, nuts, seeds, fish, and low-fat dairy, and using olive oil instead of other fats. This information is not intended to replace advice given to you by your health care provider. Make sure you discuss any questions you have with your health care provider. Document  Revised: 07/12/2019 Document Reviewed: 07/12/2019 Elsevier Patient Education  2021 Elsevier Inc.  

## 2020-08-28 NOTE — Progress Notes (Signed)
Established Patient Office Visit  Subjective:  Patient ID: Elaine Robinson, female    DOB: June 07, 1955  Age: 65 y.o. MRN: [id number]  CC:  Chief Complaint  Patient presents with  . Follow-up    lab  . Hypertension    HPI Elaine Robinson 65 y/o female who has a history of Arthritis, Depression, Hyperlipidemia, Hypertension and Herpes, presents for lab review and medication refill. Lab was unremarkable except HgbA1c that was 5.8%. She states that she's compliant with her medications and continues to make healthy lifestyle changes. She c/o lesions to her lower back/upper buttock area that has been going on for 3 days though she's on 500 mg Valacyclovir daily. Overall, she states that she's doing well and offers no further complaint.   Past Medical History:  Diagnosis Date  . Arthritis    hands left knee  . Depression   . Hyperlipidemia   . Hypertension   . MMT (medial meniscus tear)    left knee    Past Surgical History:  Procedure Laterality Date  . ABDOMINAL HYSTERECTOMY    . BREAST BIOPSY Right 05/03/2012   stereo bx  . BREAST BIOPSY Left    benign-years ago per pt  . COLONOSCOPY WITH PROPOFOL N/A 09/21/2019   Procedure: COLONOSCOPY WITH PROPOFOL;  Surgeon: Jonathon Bellows, MD;  Location: [facility] ENDOSCOPY;  Service: Gastroenterology;  Laterality: N/A;  . KNEE ARTHROSCOPY Left 06/28/2019   Procedure: LEFT KNEE ARTHROSCOPY WITH PARTIAL MEDIAL MENISCECTOMY;  Surgeon: Mcarthur Rossetti, MD;  Location: [place];  Service: Orthopedics;  Laterality: Left;    Family History  Problem Relation Age of Onset  . Heart disease Mother   . Hypertension Mother   . Heart disease Father   . Hypertension Father   . Diabetes Sister   . Stroke Sister   . Hypertension Sister   . Schizophrenia Brother   . Hyperlipidemia Brother   . Hypertension Brother   . Diabetes Maternal Aunt   . Congestive Heart Failure Brother   . Hypertension Brother   . Diabetes Brother         pre-diabetic  . Breast cancer Neg Hx     Social History   Socioeconomic History  . Marital status: Widowed    Spouse name: Not on file  . Number of children: 1  . Years of education: 11th grade  . Highest education level: 11th grade  Occupational History  . Occupation: unemployed  Tobacco Use  . Smoking status: Former Smoker    Packs/day: 0.00    Years: 45.00    Pack years: 0.00    Types: Cigarettes    Quit date: 07/02/2019    Years since quitting: 1.1  . Smokeless tobacco: Never Used  Vaping Use  . Vaping Use: Never used  Substance and Sexual Activity  . Alcohol use: Not Currently  . Drug use: Not Currently  . Sexual activity: Not Currently    Birth control/protection: None, Surgical  Other Topics Concern  . Not on file  Social History Narrative   Social Determinants screening completed on 07/10/2019. Patient does not require a referral to [facility] at this point in time.    Social Determinants of Health   Financial Resource Strain: Not on file  Food Insecurity: No Food Insecurity  . Worried About Charity fundraiser in the Last Year: Never true  . Ran Out of Food in the Last Year: Never true  Transportation Needs: No Transportation Needs  . Lack of Transportation (  Medical): No  . Lack of Transportation (Non-Medical): No  Physical Activity: Not on file  Stress: No Stress Concern Present  . Feeling of Stress : Only a little  Social Connections: Not on file  Intimate Partner Violence: Not on file    Outpatient Medications Prior to Visit  Medication Sig Dispense Refill  . sertraline (ZOLOFT) 100 MG tablet TAKE ONE TABLET BY MOUTH EVERY DAY 30 tablet 2  . valACYclovir (VALTREX) 500 MG tablet TAKE ONE TABLET BY MOUTH EVERY DAY 30 tablet 2  . amLODipine (NORVASC) 5 MG tablet TAKE ONE TABLET BY MOUTH EVERY DAY 30 tablet 4  . aspirin 81 MG chewable tablet CHEW ONE TABLET BY MOUTH EVERY DAY 30 tablet 4  . simvastatin (ZOCOR) 10 MG tablet TAKE ONE TABLET BY MOUTH EVERY  EVENING 30 tablet 4  . naproxen (NAPROSYN) 500 MG tablet Take 0.5 tablets (250 mg total) by mouth 2 (two) times daily with a meal. (Patient not taking: Reported on 08/28/2020) 10 tablet 0   No facility-administered medications prior to visit.    Allergies  Allergen Reactions  . Lisinopril Cough    ROS Review of Systems  Constitutional: Negative.   Respiratory: Negative.   Cardiovascular: Negative.   Skin:       Lesion to lower back/ upper buttocks area  Neurological: Negative.   Psychiatric/Behavioral: Negative.       Objective:    Physical Exam HENT:     Head: Normocephalic and atraumatic.  Eyes:     Extraocular Movements: Extraocular movements intact.     Conjunctiva/sclera: Conjunctivae normal.     Pupils: Pupils are equal, round, and reactive to light.  Cardiovascular:     Rate and Rhythm: Normal rate and regular rhythm.     Pulses: Normal pulses.     Heart sounds: Normal heart sounds.  Pulmonary:     Effort: Pulmonary effort is normal.     Breath sounds: Normal breath sounds.  Neurological:     General: No focal deficit present.     Mental Status: She is alert and oriented to person, place, and time. Mental status is at baseline.  Psychiatric:        Mood and Affect: Mood normal.        Behavior: Behavior normal.        Thought Content: Thought content normal.        Judgment: Judgment normal.     BP 122/85 (BP Location: Right Arm, Patient Position: Sitting, Cuff Size: Large)   Pulse 71   Temp (!) 97.2 F (36.2 C)   Resp 16   Ht 5' 5.5" (1.664 m)   Wt 197 lb 4.8 oz (89.5 kg)   SpO2 98%   BMI 32.33 kg/m  Wt Readings from Last 3 Encounters:  08/28/20 197 lb 4.8 oz (89.5 kg)  08/13/20 195 lb 8 oz (88.7 kg)  04/30/20 194 lb (88 kg)   Weight loss encouraged   Health Maintenance Due  Topic Date Due  . COVID-19 Vaccine (1) Never done  . HIV Screening  Never done  . Hepatitis C Screening  Never done  . TETANUS/TDAP  Never done  . PAP SMEAR-Modifier   Never done    There are no preventive care reminders to display for this patient.  Lab Results  Component Value Date   TSH 1.940 12/27/2018   Lab Results  Component Value Date   WBC 3.6 08/13/2020   HGB 12.2 08/13/2020   HCT 36.3 08/13/2020  MCV 88 08/13/2020   PLT 279 08/13/2020   Lab Results  Component Value Date   NA 140 08/13/2020   K 4.5 08/13/2020   CO2 24 08/13/2020   GLUCOSE 84 08/13/2020   BUN 14 08/13/2020   CREATININE 0.73 08/13/2020   BILITOT 0.3 08/13/2020   ALKPHOS 64 08/13/2020   AST 11 08/13/2020   ALT 10 08/13/2020   PROT 7.3 08/13/2020   ALBUMIN 4.7 08/13/2020   CALCIUM 9.4 08/13/2020   EGFR 93 08/13/2020   Lab Results  Component Value Date   CHOL 159 08/13/2020   Lab Results  Component Value Date   HDL 56 08/13/2020   Lab Results  Component Value Date   LDLCALC 89 08/13/2020   Lab Results  Component Value Date   TRIG 72 08/13/2020   Lab Results  Component Value Date   CHOLHDL 2.8 08/13/2020   Lab Results  Component Value Date   HGBA1C 5.8 (H) 08/13/2020      Assessment & Plan:    1. Prediabetes -Her HgbA1c was 5.8%, she defers Metformin, states that she will continue on low carb/ non concentrated sweet diet and exercise as tolerated.  2. Essential hypertension - Her blood pressure is under control and she will continue on current medication, DASH diet and exercise as tolerated. - amLODipine (NORVASC) 5 MG tablet; TAKE ONE TABLET BY MOUTH EVERY DAY  Dispense: 30 tablet; Refill: 5 - aspirin 81 MG chewable tablet; CHEW ONE TABLET BY MOUTH EVERY DAY  Dispense: 30 tablet; Refill: 4  3. Elevated lipids - Her Lipid panel is wnl, she will continue on current treatment regimen , low fatcholesterol diet and exercise as tolerated. - simvastatin (ZOCOR) 10 MG tablet; TAKE ONE TABLET BY MOUTH EVERY EVENING  Dispense: 30 tablet; Refill: 5  4. HSV (herpes simplex virus) infection - [facility], will  continue on Valacyclovir 1000 mg for 5 days during lesion outbreak and was advised to record number of outbreaks and follow up in 3-6 months.    Follow-up: Return in about 26 weeks (around 02/26/2021), or if symptoms worsen or fail to improve.    Raynell Scott Jerold Coombe, NP

## 2020-08-28 NOTE — Progress Notes (Signed)
S: Recurrent HSV outbreak. Referral from [facility] provider about patient needing  management of HSV management of HSV medication.   O: Patient has + HSV   A:  1. HSV (herpes simplex virus) infection      Patient was ordered and has been taking Valacyclovir 500 mg PO  daily as suppressive therapy x 1 year and has current out break.  Current out break is at lower back/ upper buttock area.   Reports having a couple of outbreaks this year, although taking medication daily and feels that medication is not working.   P: - recommend patient to change to episodic therapy and take valacyclovir 1 gm PO daily for 5 days when prodromal symptoms began.   - recommend to follow up in 3-6 months with PCP  - patient verbalizes understanding.    Junious Dresser, FNP

## 2020-08-29 ENCOUNTER — Other Ambulatory Visit: Payer: Self-pay

## 2020-09-01 ENCOUNTER — Other Ambulatory Visit: Payer: Self-pay

## 2020-09-15 ENCOUNTER — Other Ambulatory Visit: Payer: Self-pay

## 2020-09-15 ENCOUNTER — Emergency Department: Payer: Self-pay

## 2020-09-15 ENCOUNTER — Emergency Department
Admission: EM | Admit: 2020-09-15 | Discharge: 2020-09-15 | Disposition: A | Discharge status: Home / Self Care | Payer: Self-pay | Attending: Emergency Medicine | Admitting: Emergency Medicine

## 2020-09-15 ENCOUNTER — Encounter: Payer: Self-pay | Admitting: Emergency Medicine

## 2020-09-15 DIAGNOSIS — G5603 Carpal tunnel syndrome, bilateral upper limbs: Secondary | ICD-10-CM | POA: Insufficient documentation

## 2020-09-15 DIAGNOSIS — I10 Essential (primary) hypertension: Secondary | ICD-10-CM | POA: Insufficient documentation

## 2020-09-15 DIAGNOSIS — Z79899 Other long term (current) drug therapy: Secondary | ICD-10-CM | POA: Insufficient documentation

## 2020-09-15 DIAGNOSIS — Z7982 Long term (current) use of aspirin: Secondary | ICD-10-CM | POA: Insufficient documentation

## 2020-09-15 DIAGNOSIS — R202 Paresthesia of skin: Secondary | ICD-10-CM

## 2020-09-15 DIAGNOSIS — Z87891 Personal history of nicotine dependence: Secondary | ICD-10-CM | POA: Insufficient documentation

## 2020-09-15 MED ORDER — METHYLPREDNISOLONE 4 MG PO TBPK
ORAL_TABLET | ORAL | 0 refills | Status: DC
  Filled 2020-09-15: qty 21, 6d supply, fill #0

## 2020-09-15 NOTE — Discharge Instructions (Signed)
No acute findings on x-ray of the left wrist.  Read and follow discharge care instructions.  Wear wrist splints until evaluation by orthopedics.  Take medication as directed.

## 2020-09-15 NOTE — ED Provider Notes (Signed)
[facility] Emergency Department Provider Note   ____________________________________________   None    (approximate)  I have reviewed the triage vital signs and the nursing notes.   HISTORY  Chief Complaint Wrist Pain and Numbness    HPI Elaine Robinson is a 65 y.o. female patient complain of bilateral hand numbness for several months.  Patient also complaining of left wrist pain for 3 weeks.  Patient states numbness is worse with a.m. awakenings and gets better throughout the day.  Patient is right-hand dominant.  Rates her pain as a 5/10.  Described the pain as "aching of the left wrist.         Past Medical History:  Diagnosis Date  . Arthritis    hands left knee  . Depression   . Hyperlipidemia   . Hypertension   . MMT (medial meniscus tear)    left knee    Patient Active Problem List   Diagnosis Date Noted  . Pain of left thumb 08/13/2020  . Dizziness 01/09/2020  . Ill-fitting dentures 01/09/2020  . Pruritic rash 11/15/2019  . Pain of right breast 08/16/2019  . Acute medial meniscus tear of left knee 06/28/2019  . H/O abdominal hysterectomy 04/06/2019  . History of herpes genitalis 04/03/2019  . Smoking 04/03/2019  . Left knee pain 04/03/2019  . Prediabetes 01/30/2019  . Encounter to establish care 12/21/2018  . Essential hypertension 12/21/2018  . Elevated lipids 12/21/2018  . History of depression 12/21/2018  . Visual blurriness 12/21/2018    Past Surgical History:  Procedure Laterality Date  . ABDOMINAL HYSTERECTOMY    . BREAST BIOPSY Right 05/03/2012   stereo bx  . BREAST BIOPSY Left    benign-years ago per pt  . COLONOSCOPY WITH PROPOFOL N/A 09/21/2019   Procedure: COLONOSCOPY WITH PROPOFOL;  Surgeon: Jonathon Bellows, MD;  Location: [facility] ENDOSCOPY;  Service: Gastroenterology;  Laterality: N/A;  . KNEE ARTHROSCOPY Left 06/28/2019   Procedure: LEFT KNEE ARTHROSCOPY WITH PARTIAL MEDIAL MENISCECTOMY;  Surgeon: Mcarthur Rossetti, MD;  Location: [place];  Service: Orthopedics;  Laterality: Left;    Prior to Admission medications   Medication Sig Start Date End Date Taking? Authorizing Provider  methylPREDNISolone (MEDROL DOSEPAK) 4 MG TBPK tablet Take Tapered dose as directed 09/15/20  Yes Sable Feil, PA-C  amLODipine (NORVASC) 5 MG tablet TAKE ONE TABLET BY MOUTH EVERY DAY 08/28/20 08/28/21  Iloabachie, Chioma E, NP  aspirin 81 MG chewable tablet CHEW ONE TABLET BY MOUTH EVERY DAY 08/28/20 08/28/21  Iloabachie, Chioma E, NP  sertraline (ZOLOFT) 100 MG tablet TAKE ONE TABLET BY MOUTH EVERY DAY 06/03/20 12/24/20  Iloabachie, Chioma E, NP  simvastatin (ZOCOR) 10 MG tablet TAKE ONE TABLET BY MOUTH EVERY EVENING 08/28/20 08/28/21  Iloabachie, Chioma E, NP  valACYclovir (VALTREX) 500 MG tablet TAKE ONE TABLET BY MOUTH EVERY DAY 08/06/20 08/06/21  Iloabachie, Chioma E, NP    Allergies Lisinopril  Family History  Problem Relation Age of Onset  . Heart disease Mother   . Hypertension Mother   . Heart disease Father   . Hypertension Father   . Diabetes Sister   . Stroke Sister   . Hypertension Sister   . Schizophrenia Brother   . Hyperlipidemia Brother   . Hypertension Brother   . Diabetes Maternal Aunt   . Congestive Heart Failure Brother   . Hypertension Brother   . Diabetes Brother        pre-diabetic  . Breast cancer Neg  Hx     Social History Social History   Tobacco Use  . Smoking status: Former Smoker    Packs/day: 0.00    Years: 45.00    Pack years: 0.00    Types: Cigarettes    Quit date: 07/02/2019    Years since quitting: 1.2  . Smokeless tobacco: Never Used  Vaping Use  . Vaping Use: Never used  Substance Use Topics  . Alcohol use: Not Currently  . Drug use: Not Currently    Review of Systems Constitutional: No fever/chills Eyes: No visual changes. ENT: No sore throat. Cardiovascular: Denies chest pain. Respiratory: Denies shortness of breath. Gastrointestinal: No  abdominal pain.  No nausea, no vomiting.  No diarrhea.  No constipation. Genitourinary: Negative for dysuria. Musculoskeletal: Left wrist pain Skin: Negative for rash. Neurological: Negative for headaches, positive for bilateral hand numbness.   Psychiatric:  Depression Endocrine:  Hyperlipidemia and hypertension Allergic/Immunilogical: Lisinopril ____________________________________________   PHYSICAL EXAM:  VITAL SIGNS: ED Triage Vitals  Enc Vitals Group     BP      Pulse      Resp      Temp      Temp src      SpO2      Weight      Height      Head Circumference      Peak Flow      Pain Score      Pain Loc      Pain Edu?      Excl. in [place]?     Constitutional: Alert and oriented. Well appearing and in no acute distress. Neck: No stridor.  No cervical spine tenderness to palpation. Hematological/Lymphatic/Immunilogical: No cervical lymphadenopathy. Cardiovascular: Normal rate, regular rhythm. Grossly normal heart sounds.  Good peripheral circulation. Respiratory: Normal respiratory effort.  No retractions. Lungs CTAB. Gastrointestinal: Soft and nontender. No distention. No abdominal bruits. No CVA tenderness. Musculoskeletal: No obvious deformity to the bilateral wrists.  Patient has full Nikkel range of motion.   Neurologic:  Normal speech and language.  Positive Tinel sign  skin:  Skin is warm, dry and intact. No rash noted. Psychiatric: Mood and affect are normal. Speech and behavior are normal.  ____________________________________________   LABS (all labs ordered are listed, but only abnormal results are displayed)  Labs Reviewed - No data to display ____________________________________________  EKG   ____________________________________________  RADIOLOGY I, Sable Feil, personally viewed and evaluated these images (plain radiographs) as part of my medical decision making, as well as reviewing the written report by the radiologist.  ED MD  interpretation: No acute findings x-ray of the left wrist.  Official radiology report(s): DG Wrist Complete Left  Result Date: 09/15/2020 CLINICAL DATA:  65 year old female with 3 weeks of left wrist pain. No known injury. EXAM: LEFT WRIST - COMPLETE 3+ VIEW COMPARISON:  None. FINDINGS: Bone mineralization is within normal limits for age. There is no evidence of fracture or dislocation. Joint spaces and alignment within normal limits for age. No discrete soft tissue abnormality. IMPRESSION: No acute osseous abnormality identified, within normal limits for age. Electronically Signed   By: Genevie Ann M.D.   On: 09/15/2020 08:52    ____________________________________________   PROCEDURES  Procedure(s) performed (including Critical Care):  Procedures   ____________________________________________   INITIAL IMPRESSION / ASSESSMENT AND PLAN / ED COURSE  As part of my medical decision making, I reviewed the following data within the [employer]  Patient presents with bilateral numbness to the wrist.  Patient also complaining left wrist pain.  Discussed no acute findings on x-ray of the left wrist.  Patient complaint physical exam consistent with carpal tunnel syndrome.  Patient consulted to orthopedics for definitive evaluation and treatment.  Patient placed in bilateral soft wrist splints and given a Medrol Dosepak.      ____________________________________________   FINAL CLINICAL IMPRESSION(S) / ED DIAGNOSES  Final diagnoses:  Paresthesias  Bilateral carpal tunnel syndrome     ED Discharge Orders         Ordered    methylPREDNISolone (MEDROL DOSEPAK) 4 MG TBPK tablet        09/15/20 5638          *Please note:  Elaine Robinson was evaluated in Emergency Department on 09/15/2020 for the symptoms described in the history of present illness. She was evaluated in the context of the global COVID-19 pandemic, which necessitated consideration that the  patient might be at risk for infection with the SARS-CoV-2 virus that causes COVID-19. Institutional protocols and algorithms that pertain to the evaluation of patients at risk for COVID-19 are in a state of rapid change based on information released by regulatory bodies including the CDC and federal and state organizations. These policies and algorithms were followed during the patient's care in the ED.  Some ED evaluations and interventions may be delayed as a result of limited staffing during and the pandemic.*   Note:  This document was prepared using Dragon voice recognition software and may include unintentional dictation errors.    Sable Feil, PA-C 09/15/20 [id number]    Delman Kitten, MD 09/15/20 [phone]

## 2020-09-15 NOTE — ED Triage Notes (Signed)
Pt to ED via POV, A&O x4, ambulatory without difficulty to treatment room. Pt c/o bilateral hand numbness x several months and L wrist pain x 3 weeks. Pt states she just got tired of the numbness and wrist pain which made her seek treatment today. Pt states numbness does resolve to a point throughout the day, worse in the morning upon awakening.

## 2020-09-15 NOTE — ED Notes (Signed)
See triage note  Presents with pain/numbness to both hands for several months  Now having pain to left wrist area  Denies any injury  Good pulses

## 2020-09-23 ENCOUNTER — Other Ambulatory Visit: Payer: Self-pay

## 2020-09-23 MED ORDER — PREDNISONE 10 MG PO TABS
10.0000 mg | ORAL_TABLET | Freq: Every day | ORAL | 0 refills | Status: DC
  Filled 2020-09-23: qty 42, 12d supply, fill #0

## 2020-09-24 ENCOUNTER — Other Ambulatory Visit: Payer: Self-pay

## 2020-09-25 ENCOUNTER — Ambulatory Visit: Payer: Self-pay | Admitting: Gerontology

## 2020-09-25 ENCOUNTER — Encounter: Payer: Self-pay | Admitting: Gerontology

## 2020-09-25 ENCOUNTER — Other Ambulatory Visit: Payer: Self-pay

## 2020-09-25 VITALS — BP 129/75 | HR 76 | Temp 98.2°F | Resp 16 | Ht 65.0 in | Wt 195.1 lb

## 2020-09-25 DIAGNOSIS — H6120 Impacted cerumen, unspecified ear: Secondary | ICD-10-CM | POA: Insufficient documentation

## 2020-09-25 DIAGNOSIS — H6123 Impacted cerumen, bilateral: Secondary | ICD-10-CM

## 2020-09-25 DIAGNOSIS — F411 Generalized anxiety disorder: Secondary | ICD-10-CM

## 2020-09-25 MED ORDER — DEBROX 6.5 % OT SOLN: 5.0000 [drp] | Freq: Two times a day (BID) | OTIC | 0 refills | Status: DC

## 2020-09-25 MED ORDER — SERTRALINE HCL 100 MG PO TABS
ORAL_TABLET | Freq: Every day | ORAL | 2 refills | Status: DC
Start: 2020-09-25 — End: 2021-01-20
  Filled 2020-10-23: qty 30, 30d supply, fill #0
  Filled 2020-11-24: qty 30, 30d supply, fill #1
  Filled 2020-11-26: qty 17, 17d supply, fill #1
  Filled 2020-12-09: qty 30, 30d supply, fill #2
  Filled 2021-01-05: qty 30, 30d supply, fill #3
  Filled 2021-01-06: qty 13, 13d supply, fill #3

## 2020-09-25 MED FILL — Sertraline HCl Tab 100 MG: ORAL | 30 days supply | Qty: 30 | Fill #1 | Status: AC

## 2020-09-25 NOTE — Progress Notes (Signed)
Established Patient Office Visit  Subjective:  Patient ID: Elaine Robinson, female    DOB: Sep 18, 1955  Age: 65 y.o. MRN: [id number]  CC:  Chief Complaint  Patient presents with  . ears stopped up    X 4 days. Patient has not taken any OTC medications, but has flushed with Peroxide without relief.  . Follow-up  . Hypertension    HPI Elaine Robinson is a 65 y/o female who has history of Arthritis, Depression, Hyperlipidemia, Hypertension, presents for c/o bilateral ear fullness and medication refill. She states that it " feels like her ears are clogged up" and it has been going on for 4 days . She admits experiencing tinnitus, but denies pain, discharge, and hearing loss. She was seen at the ED on 09/15/20 for left wrist paresthesia. Imaging done showed No acute osseous abnormality identified, within normal limits for age. 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 She was started on 12 day steroid dose pak , left thumb splint and to apply ice pack tid. Currently, she states that her left wrist symptoms has improved 60%. Overall, she states that she's doing well and offers no further complaint.   Past Medical History:  Diagnosis Date  . Arthritis    hands left knee  . Depression   . Hyperlipidemia   . Hypertension   . MMT (medial meniscus tear)    left knee    Past Surgical History:  Procedure Laterality Date  . ABDOMINAL HYSTERECTOMY    . BREAST BIOPSY Right 05/03/2012   stereo bx  . BREAST BIOPSY Left    benign-years ago per pt  . COLONOSCOPY WITH PROPOFOL N/A 09/21/2019   Procedure: COLONOSCOPY WITH PROPOFOL;  Surgeon: Jonathon Bellows, MD;  Location: [facility] ENDOSCOPY;  Service: Gastroenterology;  Laterality: N/A;  . KNEE ARTHROSCOPY Left 06/28/2019   Procedure: LEFT KNEE ARTHROSCOPY WITH PARTIAL MEDIAL MENISCECTOMY;  Surgeon: Mcarthur Rossetti, MD;  Location: [place];   Service: Orthopedics;  Laterality: Left;    Family History  Problem Relation Age of Onset  . Heart disease Mother   . Hypertension Mother   . Heart disease Father   . Hypertension Father   . Diabetes Sister   . Stroke Sister   . Hypertension Sister   . Schizophrenia Brother   . Hyperlipidemia Brother   . Hypertension Brother   . Diabetes Maternal Aunt   . Congestive Heart Failure Brother   . Hypertension Brother   . Diabetes Brother        pre-diabetic  . Breast cancer Neg Hx     Social History   Socioeconomic History  . Marital status: Widowed    Spouse name: Not on file  . Number of children: 1  . Years of education: 11th grade  . Highest education level: 11th grade  Occupational History  . Occupation: unemployed  Tobacco Use  . Smoking status: Former Smoker    Packs/day: 0.00    Years: 45.00    Pack years: 0.00    Types: Cigarettes    Quit date: 07/02/2019    Years since quitting: 1.2  . Smokeless tobacco: Never Used  Vaping Use  . Vaping Use: Never used  Substance and Sexual Activity  . Alcohol use: Not Currently  . Drug use: Not Currently  . Sexual activity: Not Currently    Birth control/protection: None, Surgical  Other Topics Concern  . Not on file  Social History Narrative   Social Determinants screening completed on 07/10/2019. Patient does not require a referral to [facility] at this point in time.    Social Determinants of Health   Financial Resource Strain: Not on file  Food Insecurity: No Food Insecurity  . Worried About Charity fundraiser in the Last Year: Never true  . Ran Out of Food in the Last Year: Never true  Transportation Needs: No Transportation Needs  . Lack of Transportation (Medical): No  . Lack of Transportation (Non-Medical): No  Physical Activity: Not on file  Stress: No Stress Concern Present  . Feeling of Stress : Only a little  Social Connections: Not on file  Intimate Partner Violence: Not on file    Outpatient  Medications Prior to Visit  Medication Sig Dispense Refill  . amLODipine (NORVASC) 5 MG tablet TAKE ONE TABLET BY MOUTH EVERY DAY 30 tablet 5  . aspirin 81 MG chewable tablet CHEW ONE TABLET BY MOUTH EVERY DAY 30 tablet 4  . predniSONE (DELTASONE) 10 MG tablet Take 6 tabs for 2 days, 5 tabs for 2 days, 4 tabs for 2 days, 3 tabs for 2 days, 2 tabs for 2 days and 1 tab of 2 days 42 tablet 0  . sertraline (ZOLOFT) 100 MG tablet TAKE ONE TABLET BY MOUTH EVERY DAY 30 tablet 2  . simvastatin (ZOCOR) 10 MG tablet TAKE ONE TABLET BY MOUTH EVERY EVENING 30 tablet 5  . valACYclovir (VALTREX) 500 MG tablet TAKE ONE TABLET BY MOUTH EVERY DAY 30 tablet 2  . methylPREDNISolone (MEDROL DOSEPAK) 4 MG TBPK tablet Take 6 tablets by mouth on Day 1. Then 5 tabs on Day 2. Then 4 tabs on Day 3. Then 3 tabs on Day 4. Then 2 tabs on Day 5 and then 1 tab on Day 6. 21 tablet 0   No facility-administered medications prior to visit.    Allergies  Allergen Reactions  . Lisinopril Cough    ROS Review of Systems  HENT: Negative for dental problem, ear discharge, ear pain and hearing loss.   Respiratory: Negative.   Cardiovascular: Negative.   Neurological: Negative.   Psychiatric/Behavioral: Negative.       Objective:    Physical Exam HENT:     Right Ear: No decreased hearing noted. No tenderness. There is impacted cerumen. No mastoid tenderness.     Left Ear: No decreased hearing noted. No tenderness. There is impacted cerumen. No mastoid tenderness.  Cardiovascular:     Rate and Rhythm: Normal rate and regular rhythm.     Pulses: Normal pulses.     Heart sounds: Normal heart sounds.  Pulmonary:     Effort: Pulmonary effort is normal.     Breath sounds: Normal breath sounds.  Neurological:     General: No focal deficit present.     Mental Status: She is oriented to person, place, and time. Mental status is at baseline.  Psychiatric:        Mood and Affect: Mood normal.        Behavior: Behavior  normal.        Thought Content: Thought content normal.        Judgment: Judgment normal.     BP 129/75 (BP Location: Right Arm, Patient Position: Sitting, Cuff Size: Normal)   Pulse 76   Temp 98.2 F (36.8 C)   Resp 16   Ht _0  (1.651 m)   Wt 195 lb 1.6 oz (88.5 kg)  SpO2 97%   BMI 32.47 kg/m  Wt Readings from Last 3 Encounters:  09/25/20 195 lb 1.6 oz (88.5 kg)  09/15/20 195 lb (88.5 kg)  08/28/20 197 lb 4.8 oz (89.5 kg)   Weight loss was encouraged.  Health Maintenance Due  Topic Date Due  . COVID-19 Vaccine (1) Never done  . Pneumococcal Vaccine 53-85 Years old (1 of 4 - PCV13) Never done  . HIV Screening  Never done  . Hepatitis C Screening  Never done  . TETANUS/TDAP  Never done  . PAP SMEAR-Modifier  Never done  . Zoster Vaccines- Shingrix (1 of 2) Never done    There are no preventive care reminders to display for this patient.  Lab Results  Component Value Date   TSH 1.940 12/27/2018   Lab Results  Component Value Date   WBC 3.6 08/13/2020   HGB 12.2 08/13/2020   HCT 36.3 08/13/2020   MCV 88 08/13/2020   PLT 279 08/13/2020   Lab Results  Component Value Date   NA 140 08/13/2020   K 4.5 08/13/2020   CO2 24 08/13/2020   GLUCOSE 84 08/13/2020   BUN 14 08/13/2020   CREATININE 0.73 08/13/2020   BILITOT 0.3 08/13/2020   ALKPHOS 64 08/13/2020   AST 11 08/13/2020   ALT 10 08/13/2020   PROT 7.3 08/13/2020   ALBUMIN 4.7 08/13/2020   CALCIUM 9.4 08/13/2020   EGFR 93 08/13/2020   Lab Results  Component Value Date   CHOL 159 08/13/2020   Lab Results  Component Value Date   HDL 56 08/13/2020   Lab Results  Component Value Date   LDLCALC 89 08/13/2020   Lab Results  Component Value Date   TRIG 72 08/13/2020   Lab Results  Component Value Date   CHOLHDL 2.8 08/13/2020   Lab Results  Component Value Date   HGBA1C 5.8 (H) 08/13/2020      Assessment & Plan:   1. Generalized anxiety disorder - Her anxiety is under control, she will  continue on current medication and will [facility] up with [facility] behavioral health. - sertraline (ZOLOFT) 100 MG tablet; TAKE ONE TABLET BY MOUTH EVERY DAY  Dispense: 30 tablet; Refill: 2  2. Bilateral impacted cerumen - She has bilateral ear impaction, was advised to use Debrox as ordered, educated on medication side effects and to notify clinic. - carbamide peroxide (DEBROX) 6.5 % OTIC solution; Place 5 drops into both ears 2 (two) times daily.  Dispense: 15 mL; Refill: 0     Follow-up: Return in about 4 weeks (around 10/23/2020), or if symptoms worsen or fail to improve.    Kourtlyn Charlet Jerold Coombe, NP

## 2020-09-25 NOTE — Patient Instructions (Signed)
Carbamide Peroxide ear solution What is this medicine? CARBAMIDE PEROXIDE (CAR bah mide per OX ide) is used to soften and help remove ear wax. This medicine may be used for other purposes; ask your health care provider or pharmacist if you have questions. COMMON BRAND NAME(S): Auro Ear, Auro Earache Relief, Debrox, Ear Drops, Ear Wax Removal, Ear Wax Remover, Earwax Treatment, Murine, Thera-Ear What should I tell my health care provider before I take this medicine? They need to know if you have any of these conditions:  dizziness  ear discharge  ear pain, irritation or rash  infection  perforated eardrum (hole in eardrum)  an unusual or allergic reaction to carbamide peroxide, glycerin, hydrogen peroxide, other medicines, foods, dyes, or preservatives  pregnant or trying to get pregnant  breast-feeding How should I use this medicine? This medicine is only for use in the outer ear canal. Follow the directions carefully. Wash hands before and after use. The solution may be warmed by holding the bottle in the hand for 1 to 2 minutes. Lie with the affected ear facing upward. Place the proper number of drops into the ear canal. After the drops are instilled, remain lying with the affected ear upward for 5 minutes to help the drops stay in the ear canal. A cotton ball may be gently inserted at the ear opening for no longer than 5 to 10 minutes to ensure retention. Repeat, if necessary, for the opposite ear. Do not touch the tip of the dropper to the ear, fingertips, or other surface. Do not rinse the dropper after use. Keep container tightly closed. Talk to your pediatrician regarding the use of this medicine in children. While this drug may be used in children as young as 12 years for selected conditions, precautions do apply. Overdosage: If you think you have taken too much of this medicine contact a poison control center or emergency room at once. NOTE: This medicine is only for you. Do not  share this medicine with others. What if I miss a dose? If you miss a dose, use it as soon as you can. If it is almost time for your next dose, use only that dose. Do not use double or extra doses. What may interact with this medicine? Interactions are not expected. Do not use any other ear products without asking your doctor or health care professional. This list may not describe all possible interactions. Give your health care provider a list of all the medicines, herbs, non-prescription drugs, or dietary supplements you use. Also tell them if you smoke, drink alcohol, or use illegal drugs. Some items may interact with your medicine. What should I watch for while using this medicine? This medicine is not for long-term use. Do not use for more than 4 days without checking with your health care professional. Contact your doctor or health care professional if your condition does not start to get better within a few days or if you notice burning, redness, itching or swelling. What side effects may I notice from receiving this medicine? Side effects that you should report to your doctor or health care professional as soon as possible:  allergic reactions like skin rash, itching or hives, swelling of the face, lips, or tongue  burning, itching, and redness  worsening ear pain  rash Side effects that usually do not require medical attention (report to your doctor or health care professional if they continue or are bothersome):  abnormal sensation while putting the drops in the ear    temporary reduction in hearing (but not complete loss of hearing) This list may not describe all possible side effects. Call your doctor for medical advice about side effects. You may report side effects to FDA at 1-800-FDA-1088. Where should I keep my medicine? Keep out of the reach of children. Store at room temperature between 15 and 30 degrees C (59 and 86 degrees F) in a tight, light-resistant container. Keep  bottle away from excessive heat and direct sunlight. Throw away any unused medicine after the expiration date. NOTE: This sheet is a summary. It may not cover all possible information. If you have questions about this medicine, talk to your doctor, pharmacist, or health care provider.  2021 Elsevier/Gold Standard (2007-07-25 14:00:02)  

## 2020-09-26 ENCOUNTER — Other Ambulatory Visit: Payer: Self-pay

## 2020-09-27 ENCOUNTER — Other Ambulatory Visit: Payer: Self-pay

## 2020-09-27 ENCOUNTER — Encounter: Payer: Self-pay | Admitting: Emergency Medicine

## 2020-09-27 ENCOUNTER — Emergency Department
Admission: EM | Admit: 2020-09-27 | Discharge: 2020-09-27 | Disposition: A | Discharge status: Home / Self Care | Payer: Self-pay | Attending: Emergency Medicine | Admitting: Emergency Medicine

## 2020-09-27 DIAGNOSIS — Z7982 Long term (current) use of aspirin: Secondary | ICD-10-CM | POA: Insufficient documentation

## 2020-09-27 DIAGNOSIS — Z87891 Personal history of nicotine dependence: Secondary | ICD-10-CM | POA: Insufficient documentation

## 2020-09-27 DIAGNOSIS — I10 Essential (primary) hypertension: Secondary | ICD-10-CM | POA: Insufficient documentation

## 2020-09-27 DIAGNOSIS — H6123 Impacted cerumen, bilateral: Secondary | ICD-10-CM | POA: Insufficient documentation

## 2020-09-27 DIAGNOSIS — Z79899 Other long term (current) drug therapy: Secondary | ICD-10-CM | POA: Insufficient documentation

## 2020-09-27 NOTE — ED Provider Notes (Signed)
[facility] Emergency Department Provider Note   ____________________________________________   Event Date/Time   First MD Initiated Contact with Patient 09/27/20 1748     (approximate)  I have reviewed the triage vital signs and the nursing notes.   HISTORY  Chief Complaint Otalgia    HPI Elaine Robinson is a 65 y.o. female patient complain of 6 days of decreased hearing and mild ear pain.  Patient denies vertigo.  Patient stated no relief with over-the-counter eardrops.  Patient has history of cerumen impaction.  Denies ear pain at this time.         Past Medical History:  Diagnosis Date  . Arthritis    hands left knee  . Depression   . Hyperlipidemia   . Hypertension   . MMT (medial meniscus tear)    left knee    Patient Active Problem List   Diagnosis Date Noted  . Cerumen impaction 09/25/2020  . Pain of left thumb 08/13/2020  . Dizziness 01/09/2020  . Ill-fitting dentures 01/09/2020  . Pruritic rash 11/15/2019  . Pain of right breast 08/16/2019  . Acute medial meniscus tear of left knee 06/28/2019  . H/O abdominal hysterectomy 04/06/2019  . History of herpes genitalis 04/03/2019  . Smoking 04/03/2019  . Left knee pain 04/03/2019  . Prediabetes 01/30/2019  . Encounter to establish care 12/21/2018  . Essential hypertension 12/21/2018  . Elevated lipids 12/21/2018  . History of depression 12/21/2018  . Visual blurriness 12/21/2018    Past Surgical History:  Procedure Laterality Date  . ABDOMINAL HYSTERECTOMY    . BREAST BIOPSY Right 05/03/2012   stereo bx  . BREAST BIOPSY Left    benign-years ago per pt  . COLONOSCOPY WITH PROPOFOL N/A 09/21/2019   Procedure: COLONOSCOPY WITH PROPOFOL;  Surgeon: Jonathon Bellows, MD;  Location: [facility] ENDOSCOPY;  Service: Gastroenterology;  Laterality: N/A;  . KNEE ARTHROSCOPY Left 06/28/2019   Procedure: LEFT KNEE ARTHROSCOPY WITH PARTIAL MEDIAL MENISCECTOMY;  Surgeon: Mcarthur Rossetti, MD;   Location: [place];  Service: Orthopedics;  Laterality: Left;    Prior to Admission medications   Medication Sig Start Date End Date Taking? Authorizing Provider  amLODipine (NORVASC) 5 MG tablet TAKE ONE TABLET BY MOUTH EVERY DAY 08/28/20 08/28/21  Iloabachie, Chioma E, NP  aspirin 81 MG chewable tablet CHEW ONE TABLET BY MOUTH EVERY DAY 08/28/20 08/28/21  Iloabachie, Chioma E, NP  carbamide peroxide (DEBROX) 6.5 % OTIC solution Place 5 drops into both ears 2 (two) times daily. 09/25/20   Iloabachie, Chioma E, NP  predniSONE (DELTASONE) 10 MG tablet Take 6 tabs for 2 days, 5 tabs for 2 days, 4 tabs for 2 days, 3 tabs for 2 days, 2 tabs for 2 days and 1 tab of 2 days 09/23/20     sertraline (ZOLOFT) 100 MG tablet TAKE ONE TABLET BY MOUTH EVERY DAY 06/03/20 12/24/20  Iloabachie, Chioma E, NP  sertraline (ZOLOFT) 100 MG tablet TAKE ONE TABLET BY MOUTH EVERY DAY 09/25/20 04/17/21  Iloabachie, Chioma E, NP  simvastatin (ZOCOR) 10 MG tablet TAKE ONE TABLET BY MOUTH EVERY EVENING 08/28/20 08/28/21  Iloabachie, Chioma E, NP  valACYclovir (VALTREX) 500 MG tablet TAKE ONE TABLET BY MOUTH EVERY DAY 08/06/20 08/06/21  Iloabachie, Chioma E, NP    Allergies Lisinopril  Family History  Problem Relation Age of Onset  . Heart disease Mother   . Hypertension Mother   . Heart disease Father   . Hypertension Father   . Diabetes  Sister   . Stroke Sister   . Hypertension Sister   . Schizophrenia Brother   . Hyperlipidemia Brother   . Hypertension Brother   . Diabetes Maternal Aunt   . Congestive Heart Failure Brother   . Hypertension Brother   . Diabetes Brother        pre-diabetic  . Breast cancer Neg Hx     Social History Social History   Tobacco Use  . Smoking status: Former Smoker    Packs/day: 0.00    Years: 45.00    Pack years: 0.00    Types: Cigarettes    Quit date: 07/02/2019    Years since quitting: 1.2  . Smokeless tobacco: Never Used  Vaping Use  . Vaping Use: Never used  Substance  Use Topics  . Alcohol use: Not Currently  . Drug use: Not Currently    Review of Systems Constitutional: No fever/chills Eyes: No visual changes. ENT: No sore throat. Ears: Decreased hearing secondary to wax buildup. Cardiovascular: Denies chest pain. Respiratory: Denies shortness of breath. Gastrointestinal: No abdominal pain.  No nausea, no vomiting.  No diarrhea.  No constipation. Genitourinary: Negative for dysuria. Musculoskeletal: Negative for back pain. Skin: Negative for rash. Neurological: Negative for headaches, focal weakness or numbness. Psychiatric:  Depression Endocrine:  Hyperlipidemia and hypertension Allergic/Immunilogical: Lisinopril  ____________________________________________   PHYSICAL EXAM:  VITAL SIGNS: ED Triage Vitals  Enc Vitals Group     BP 09/27/20 1744 116/75     Pulse Rate 09/27/20 1744 80     Resp 09/27/20 1744 17     Temp 09/27/20 1744 99.2 F (37.3 C)     Temp Source 09/27/20 1744 Oral     SpO2 09/27/20 1744 97 %     Weight 09/27/20 1731 195 lb (88.5 kg)     Height 09/27/20 1731 5\' 5"  (1.651 m)     Head Circumference --      Peak Flow --      Pain Score 09/27/20 1731 0     Pain Loc --      Pain Edu? --      Excl. in [place]? --     Constitutional: Alert and oriented. Well appearing and in no acute distress. Eyes: Conjunctivae are normal. PERRL. EOMI. Head: Atraumatic. Nose: No congestion/rhinnorhea. EARS: Bilateral cerumen impaction. Mouth/Throat: Mucous membranes are moist.  Oropharynx non-erythematous. Cardiovascular: Normal rate, regular rhythm. Grossly normal heart sounds.  Good peripheral circulation. Respiratory: Normal respiratory effort.  No retractions. Lungs CTAB. ____________________________________________   LABS (all labs ordered are listed, but only abnormal results are displayed)  Labs Reviewed - No data to  display ____________________________________________  EKG   ____________________________________________  RADIOLOGY I, Sable Feil, personally viewed and evaluated these images (plain radiographs) as part of my medical decision making, as well as reviewing the written report by the radiologist.  ED MD interpretation:    Official radiology report(s): No results found.  ____________________________________________   PROCEDURES  Procedure(s) performed (including Critical Care):  Procedures   ____________________________________________   INITIAL IMPRESSION / ASSESSMENT AND PLAN / ED COURSE  As part of my medical decision making, I reviewed the following data within the [place]         Patient presents with decreased hearing secondary to cerumen impaction.  Impaction was removed via irrigation.  Patient tolerated procedure without discomfort.      ____________________________________________   FINAL CLINICAL IMPRESSION(S) / ED DIAGNOSES  Final diagnoses:  Bilateral impacted cerumen  ED Discharge Orders    None       Note:  This document was prepared using Dragon voice recognition software and may include unintentional dictation errors.    Sable Feil, PA-C 09/27/20 1934    Carrie Mew, MD 09/28/20 0001

## 2020-09-27 NOTE — ED Notes (Signed)
Patient declined discharge vital signs. 

## 2020-09-27 NOTE — Discharge Instructions (Signed)
Earwax was removed by irrigation.

## 2020-09-27 NOTE — ED Triage Notes (Signed)
Pt reports pressure like pain to both of her ears for the last 7 days. Pt reports has been using OTC ear drops with no relief

## 2020-09-27 NOTE — ED Notes (Signed)
Patient tolerated ear irrigation well. A clump of earwax was removed from each ear. Patient states she can hear better. [place] PA-C aware and came to the room to assess the ears.

## 2020-10-01 ENCOUNTER — Telehealth: Payer: Self-pay | Admitting: Pharmacist

## 2020-10-01 NOTE — Telephone Encounter (Signed)
Patient approved for medication assistance at [facility] until 08/24/21, as long as eligibility criteria continues to be met.   Vonda [facility] Administrative Assistant 

## 2020-10-02 ENCOUNTER — Other Ambulatory Visit: Payer: Self-pay

## 2020-10-07 ENCOUNTER — Other Ambulatory Visit: Payer: Self-pay

## 2020-10-09 ENCOUNTER — Encounter: Payer: Self-pay | Admitting: Emergency Medicine

## 2020-10-09 ENCOUNTER — Telehealth: Payer: Self-pay | Admitting: Gerontology

## 2020-10-09 ENCOUNTER — Other Ambulatory Visit: Payer: Self-pay

## 2020-10-09 ENCOUNTER — Emergency Department
Admission: EM | Admit: 2020-10-09 | Discharge: 2020-10-09 | Disposition: A | Discharge status: Home / Self Care | Payer: Self-pay | Attending: Emergency Medicine | Admitting: Emergency Medicine

## 2020-10-09 DIAGNOSIS — I10 Essential (primary) hypertension: Secondary | ICD-10-CM | POA: Insufficient documentation

## 2020-10-09 DIAGNOSIS — Z87891 Personal history of nicotine dependence: Secondary | ICD-10-CM | POA: Insufficient documentation

## 2020-10-09 DIAGNOSIS — M25532 Pain in left wrist: Secondary | ICD-10-CM | POA: Insufficient documentation

## 2020-10-09 DIAGNOSIS — Z7982 Long term (current) use of aspirin: Secondary | ICD-10-CM | POA: Insufficient documentation

## 2020-10-09 DIAGNOSIS — Z79899 Other long term (current) drug therapy: Secondary | ICD-10-CM | POA: Insufficient documentation

## 2020-10-09 MED ORDER — KETOROLAC TROMETHAMINE 30 MG/ML IJ SOLN
30.0000 mg | Freq: Once | INTRAMUSCULAR | Status: AC
  Administered 2020-10-09: 30 mg via INTRAMUSCULAR
  Filled 2020-10-09: qty 1

## 2020-10-09 MED ORDER — DICLOFENAC SODIUM 1 % EX GEL
2.0000 g | Freq: Four times a day (QID) | CUTANEOUS | 0 refills | Status: DC
  Filled 2020-10-09 – 2020-11-24 (×2): qty 50, 7d supply, fill #0

## 2020-10-09 MED ORDER — CELECOXIB 50 MG PO CAPS
50.0000 mg | ORAL_CAPSULE | Freq: Two times a day (BID) | ORAL | 0 refills | Status: AC
  Filled 2020-10-09: qty 14, 7d supply, fill #0

## 2020-10-09 NOTE — Discharge Instructions (Addendum)
You can take Celebrex up to 2 times a day. Apply Voltaren to left wrist once daily. Continue to wear wrist splint at night.

## 2020-10-09 NOTE — ED Provider Notes (Signed)
[facility]-EMERGENCY DEPARTMENT  ____________________________________________  Time seen: Approximately 9:17 PM  I have reviewed the triage vital signs and the nursing notes.   HISTORY  Chief Complaint Joint Swelling   Historian Patient     HPI Elaine Robinson is a 65 y.o. female left wrist pain for the past several weeks.  Patient has been seen and evaluated by orthopedics and has been diagnosed with de Quervain's tenosynovitis.  Patient has been wearing a splint as directed and took a tapered steroid and reports that she had transient improvement but her symptoms have returned.  She has not tried anti-inflammatories in the past and denies history of gastritis or GI bleed.  No recent falls or other mechanisms of trauma.   Past Medical History:  Diagnosis Date   Arthritis    hands left knee   Depression    Hyperlipidemia    Hypertension    MMT (medial meniscus tear)    left knee     Immunizations up to date:  Yes.     Past Medical History:  Diagnosis Date   Arthritis    hands left knee   Depression    Hyperlipidemia    Hypertension    MMT (medial meniscus tear)    left knee    Patient Active Problem List   Diagnosis Date Noted   Cerumen impaction 09/25/2020   Pain of left thumb 08/13/2020   Dizziness 01/09/2020   Ill-fitting dentures 01/09/2020   Pruritic rash 11/15/2019   Pain of right breast 08/16/2019   Acute medial meniscus tear of left knee 06/28/2019   H/O abdominal hysterectomy 04/06/2019   History of herpes genitalis 04/03/2019   Smoking 04/03/2019   Left knee pain 04/03/2019   Prediabetes 01/30/2019   Encounter to establish care 12/21/2018   Essential hypertension 12/21/2018   Elevated lipids 12/21/2018   History of depression 12/21/2018   Visual blurriness 12/21/2018    Past Surgical History:  Procedure Laterality Date   ABDOMINAL HYSTERECTOMY     BREAST BIOPSY Right 05/03/2012   stereo bx   BREAST BIOPSY Left    benign-years ago per  pt   COLONOSCOPY WITH PROPOFOL N/A 09/21/2019   Procedure: COLONOSCOPY WITH PROPOFOL;  Surgeon: Jonathon Bellows, MD;  Location: [facility] ENDOSCOPY;  Service: Gastroenterology;  Laterality: N/A;   KNEE ARTHROSCOPY Left 06/28/2019   Procedure: LEFT KNEE ARTHROSCOPY WITH PARTIAL MEDIAL MENISCECTOMY;  Surgeon: Mcarthur Rossetti, MD;  Location: [place];  Service: Orthopedics;  Laterality: Left;    Prior to Admission medications   Medication Sig Start Date End Date Taking? Authorizing Provider  celecoxib (CELEBREX) 50 MG capsule Take 1 capsule (50 mg total) by mouth 2 (two) times daily for 7 days. 10/09/20 10/16/20 Yes Vallarie Mare M, PA-C  diclofenac Sodium (VOLTAREN) 1 % GEL Apply 2 g topically 4 (four) times daily for 7 days. 10/09/20 10/16/20 Yes Vallarie Mare M, PA-C  amLODipine (NORVASC) 5 MG tablet TAKE ONE TABLET BY MOUTH EVERY DAY 08/28/20 08/28/21  Iloabachie, Chioma E, NP  aspirin 81 MG chewable tablet CHEW ONE TABLET BY MOUTH EVERY DAY 08/28/20 08/28/21  Iloabachie, Chioma E, NP  carbamide peroxide (DEBROX) 6.5 % OTIC solution Place 5 drops into both ears 2 (two) times daily. 09/25/20   Iloabachie, Chioma E, NP  predniSONE (DELTASONE) 10 MG tablet Take 6 tabs for 2 days, 5 tabs for 2 days, 4 tabs for 2 days, 3 tabs for 2 days, 2 tabs for 2 days and 1 tab of 2 days  09/23/20     sertraline (ZOLOFT) 100 MG tablet TAKE ONE TABLET BY MOUTH EVERY DAY 06/03/20 12/24/20  Iloabachie, Chioma E, NP  sertraline (ZOLOFT) 100 MG tablet TAKE ONE TABLET BY MOUTH EVERY DAY 09/25/20 04/17/21  Iloabachie, Chioma E, NP  simvastatin (ZOCOR) 10 MG tablet TAKE ONE TABLET BY MOUTH EVERY EVENING 08/28/20 08/28/21  Iloabachie, Chioma E, NP  valACYclovir (VALTREX) 500 MG tablet TAKE ONE TABLET BY MOUTH EVERY DAY 08/06/20 08/06/21  Iloabachie, Chioma E, NP    Allergies Lisinopril  Family History  Problem Relation Age of Onset   Heart disease Mother    Hypertension Mother    Heart disease Father    Hypertension Father     Diabetes Sister    Stroke Sister    Hypertension Sister    Schizophrenia Brother    Hyperlipidemia Brother    Hypertension Brother    Diabetes Maternal Aunt    Congestive Heart Failure Brother    Hypertension Brother    Diabetes Brother        pre-diabetic   Breast cancer Neg Hx     Social History Social History   Tobacco Use   Smoking status: Former    Packs/day: 0.00    Years: 45.00    Pack years: 0.00    Types: Cigarettes    Quit date: 07/02/2019    Years since quitting: 1.2   Smokeless tobacco: Never  Vaping Use   Vaping Use: Never used  Substance Use Topics   Alcohol use: Not Currently   Drug use: Not Currently     Review of Systems  Constitutional: No fever/chills Eyes:  No discharge ENT: No upper respiratory complaints. Respiratory: no cough. No SOB/ use of accessory muscles to breath Gastrointestinal:   No nausea, no vomiting.  No diarrhea.  No constipation. Musculoskeletal: Patient has left wrist pain.  Skin: Negative for rash, abrasions, lacerations, ecchymosis.   ____________________________________________   PHYSICAL EXAM:  VITAL SIGNS: ED Triage Vitals  Enc Vitals Group     BP 10/09/20 1534 123/78     Pulse Rate 10/09/20 1534 86     Resp 10/09/20 1534 18     Temp 10/09/20 1534 98.8 F (37.1 C)     Temp Source 10/09/20 1534 Oral     SpO2 10/09/20 1534 97 %     Weight 10/09/20 1527 195 lb 1.7 oz (88.5 kg)     Height 10/09/20 1527 5\' 5"  (1.651 m)     Head Circumference --      Peak Flow --      Pain Score 10/09/20 1525 5     Pain Loc --      Pain Edu? --      Excl. in [place]? --      Constitutional: Alert and oriented. Well appearing and in no acute distress. Eyes: Conjunctivae are normal. PERRL. EOMI. Head: Atraumatic. ENT:  Cardiovascular: Normal rate, regular rhythm. Normal S1 and S2.  Good peripheral circulation. Respiratory: Normal respiratory effort without tachypnea or retractions. Lungs CTAB. Good air entry to the bases with no  decreased or absent breath sounds Gastrointestinal: Bowel sounds x 4 quadrants. Soft and nontender to palpation. No guarding or rigidity. No distention. Musculoskeletal: Full range of motion to all extremities. No obvious deformities noted.  Positive Finkelstein test on the left.  Palpable radial and ulnar pulses bilaterally and symmetrically. Neurologic:  Normal for age. No gross focal neurologic deficits are appreciated.  Skin:  Skin is warm, dry and intact. No  rash noted. Psychiatric: Mood and affect are normal for age. Speech and behavior are normal.   ____________________________________________   LABS (all labs ordered are listed, but only abnormal results are displayed)  Labs Reviewed - No data to display ____________________________________________  EKG   ____________________________________________  RADIOLOGY     No results found.  ____________________________________________    PROCEDURES  Procedure(s) performed:     Procedures     Medications  ketorolac (TORADOL) 30 MG/ML injection 30 mg (30 mg Intramuscular Given 10/09/20 1708)     ____________________________________________   INITIAL IMPRESSION / ASSESSMENT AND PLAN / ED COURSE  Pertinent labs & imaging results that were available during my care of the patient were reviewed by me and considered in my medical decision making (see chart for details).    Assessment and plan Wrist pain 65 year old female presents to the emergency department with persistent left wrist pain despite steroid use.  Patient was given an injection of Toradol and I started patient on Celebrex, Cox 2 selective NSAID for the next week until she can follow-up with orthopedics.  Recommended continuing to wear splint.  Also recommended topical Voltaren gel.  Patient felt comfortable with that plan.  All patient questions were answered.      ____________________________________________  FINAL CLINICAL IMPRESSION(S) / ED  DIAGNOSES  Final diagnoses:  Left wrist pain      NEW MEDICATIONS STARTED DURING THIS VISIT:  ED Discharge Orders          Ordered    celecoxib (CELEBREX) 50 MG capsule  2 times daily        10/09/20 1702    diclofenac Sodium (VOLTAREN) 1 % GEL  4 times daily        10/09/20 1704                This chart was dictated using voice recognition software/Dragon. Despite best efforts to proofread, errors can occur which can change the meaning. Any change was purely unintentional.     Karren Cobble 10/09/20 2120    Vladimir Crofts, MD 10/09/20 [phone]

## 2020-10-09 NOTE — Telephone Encounter (Signed)
The pt was successfully contacted and an appt was scheduled for 10/23/20 at 9 am.

## 2020-10-09 NOTE — ED Triage Notes (Signed)
Presents with with left wrist pain and swelling

## 2020-10-10 ENCOUNTER — Other Ambulatory Visit: Payer: Self-pay

## 2020-10-13 ENCOUNTER — Other Ambulatory Visit: Payer: Self-pay

## 2020-10-23 ENCOUNTER — Encounter: Payer: Self-pay | Admitting: Gerontology

## 2020-10-23 ENCOUNTER — Other Ambulatory Visit: Payer: Self-pay

## 2020-10-23 ENCOUNTER — Ambulatory Visit: Payer: Self-pay | Admitting: Licensed Clinical Social Worker

## 2020-10-23 ENCOUNTER — Ambulatory Visit: Payer: Self-pay | Admitting: Gerontology

## 2020-10-23 VITALS — BP 122/71 | HR 85 | Temp 97.2°F | Resp 16 | Ht 66.0 in | Wt 196.6 lb

## 2020-10-23 DIAGNOSIS — R300 Dysuria: Secondary | ICD-10-CM

## 2020-10-23 DIAGNOSIS — F330 Major depressive disorder, recurrent, mild: Secondary | ICD-10-CM

## 2020-10-23 DIAGNOSIS — F411 Generalized anxiety disorder: Secondary | ICD-10-CM

## 2020-10-23 DIAGNOSIS — R350 Frequency of micturition: Secondary | ICD-10-CM

## 2020-10-23 MED ORDER — NITROFURANTOIN MONOHYD MACRO 100 MG PO CAPS
100.0000 mg | ORAL_CAPSULE | Freq: Two times a day (BID) | ORAL | 0 refills | Status: DC
  Filled 2020-10-23: qty 10, 5d supply, fill #0

## 2020-10-23 NOTE — BH Specialist Note (Signed)
Integrated Behavioral Health Follow Up In-Person Visit  MRN: [id number] Name: Elaine Robinson   Total time: 60 minutes  Types of Service: Telephone visit Patient consents to telephone visit and 2 patient identifiers were used to identify patient   Interpretor:No. Interpretor Name and Language:    Subjective: Elaine Robinson is a 65 y.o. female accompanied by  herself Patient was referred by Carlyon Shadow, NP for Mental Health. Patient reports the following symptoms/concerns: The patient notes that she has felt a bit more down since her last follow-up visit. She dicussed family stressors impacting her life and how she has been coping with them. She noted her brother who was diagnosed with paranoid schizophrenia is always on her mind. She explained that her nephew is currently in the hospital for severe uncontrollable seizures. She noted her nephew experienced a severe brain injury in 2018 and he has suffered from bad seizures ever since. She noted her sister had a stroke, but has done well in physical therapy and is able to get around well. She discussed how her sister doesn't let anything get her down and is so happy that God has blessed her. The patient stated she copes with the health related family stressors in her life by praying and giving her troubles to God. She noted that she has a very supportive church that she attends regularly. She noted that work and staying busy help her to not focus on her problems as much and feels that overall she is doing well. The patient stated she thinks she needs a refill on her medications. The patient denied any suicidal or homicidal thoughts.  Duration of problem: Years; Severity of problem: mild  Objective: Mood: Euthymic and Affect: Appropriate Risk of harm to self or others: No plan to harm self or others  Life Context: Family and Social: see above School/Work: see above Self-Care: see above Life Changes: see above  Patient and/or Family's  Strengths/Protective Factors: Social and Emotional competence, Concrete supports in place (healthy food, safe environments, etc.), and Sense of purpose  Goals Addressed: Patient will:  Reduce symptoms of: anxiety, depression, and stress   Increase knowledge and/or ability of: coping skills, healthy habits, self-management skills, and stress reduction   Demonstrate ability to: Increase healthy adjustment to current life circumstances  Progress towards Goals: Ongoing  Interventions: Interventions utilized:  CBT Cognitive Behavioral Therapy was utilized by the clinician during today's follow up session. The clinician processed with the patient how they have been doing since the last follow-up session. The clinician provided a space for the patient to ventilate their frustrations regarding their current life circumstances. Clinician measured the patient's anxiety and depression on a numerical scale. Clinician congratulated the patient for utilizing coping skills to deal with her current life circumstances and explained to the patient that it sounds like she has been been doing well. Clinician reminded the patient that while it is good to stay busy it is important to make time to recharge and encouraged the patient to incorporate self care into her daily routine. Clinician offered to message the patient's primary care provider regarding her medication refill request. The clinician provided the patient with her new hours and made patient a follow- appointment.  Standardized Assessments completed: GAD-7 and PHQ 9 GAD-7     7 PHQ-9     5  Assessment: Patient currently experiencing see above.   Patient may benefit from see above.  Plan: Follow up with behavioral health clinician on : 11/25/2020 at 9:00 AM  Behavioral  recommendations:  Referral(s): [place] (In Clinic) "From scale of 1-10, how likely are you to follow plan?":   Lesli Albee, LCSWA

## 2020-10-23 NOTE — Progress Notes (Signed)
Established Patient Office Visit  Subjective:  Patient ID: Elaine Robinson, female    DOB: 1955-09-08  Age: 65 y.o. MRN: [id number]  CC:  Chief Complaint  Patient presents with   Follow-up   Hypertension   Urinary Frequency    Patient states she has had urinary frequency and painful urination x 4 days.    HPI Elaine Robinson is a 65 y/o female who has history of Arthritis, Depression, Hyperlipidemia, Hypertension, presents for c/o urinary frequency , urgency and dysuria that has been going on for 4 days. She states that it's very painful when urinating and felling of incomplete bladder emptying. She denies fever, chills, vaginal pain, discharge, pelvic and flank pain.Overall, she states that she's doing well and offers no further complaint.  Past Medical History:  Diagnosis Date   Arthritis    hands left knee   Depression    Hyperlipidemia    Hypertension    MMT (medial meniscus tear)    left knee    Past Surgical History:  Procedure Laterality Date   ABDOMINAL HYSTERECTOMY     BREAST BIOPSY Right 05/03/2012   stereo bx   BREAST BIOPSY Left    benign-years ago per pt   COLONOSCOPY WITH PROPOFOL N/A 09/21/2019   Procedure: COLONOSCOPY WITH PROPOFOL;  Surgeon: Jonathon Bellows, MD;  Location: [facility] ENDOSCOPY;  Service: Gastroenterology;  Laterality: N/A;   KNEE ARTHROSCOPY Left 06/28/2019   Procedure: LEFT KNEE ARTHROSCOPY WITH PARTIAL MEDIAL MENISCECTOMY;  Surgeon: Mcarthur Rossetti, MD;  Location: [place];  Service: Orthopedics;  Laterality: Left;    Family History  Problem Relation Age of Onset   Heart disease Mother    Hypertension Mother    Heart disease Father    Hypertension Father    Diabetes Sister    Stroke Sister    Hypertension Sister    Schizophrenia Brother    Hyperlipidemia Brother    Hypertension Brother    Diabetes Maternal Aunt    Congestive Heart Failure Brother    Hypertension Brother    Diabetes Brother        pre-diabetic    Breast cancer Neg Hx     Social History   Socioeconomic History   Marital status: Widowed    Spouse name: Not on file   Number of children: 1   Years of education: 11th grade   Highest education level: 11th grade  Occupational History   Occupation: unemployed  Tobacco Use   Smoking status: Former    Packs/day: 0.00    Years: 45.00    Pack years: 0.00    Types: Cigarettes    Quit date: 07/02/2019    Years since quitting: 1.3   Smokeless tobacco: Never  Vaping Use   Vaping Use: Never used  Substance and Sexual Activity   Alcohol use: Not Currently   Drug use: Not Currently   Sexual activity: Not Currently    Birth control/protection: None, Surgical  Other Topics Concern   Not on file  Social History Narrative   Social Determinants screening completed on 07/10/2019. Patient does not require a referral to [facility] at this point in time.    Social Determinants of Health   Financial Resource Strain: Not on file  Food Insecurity: No Food Insecurity   Worried About Charity fundraiser in the Last Year: Never true   Ran Out of Food in the Last Year: Never true  Transportation Needs: No Transportation Needs   Lack of Transportation (Medical):  No   Lack of Transportation (Non-Medical): No  Physical Activity: Not on file  Stress: No Stress Concern Present   Feeling of Stress : Only a little  Social Connections: Not on file  Intimate Partner Violence: Not on file    Outpatient Medications Prior to Visit  Medication Sig Dispense Refill   amLODipine (NORVASC) 5 MG tablet TAKE ONE TABLET BY MOUTH EVERY DAY 30 tablet 5   aspirin 81 MG chewable tablet CHEW ONE TABLET BY MOUTH EVERY DAY 30 tablet 4   sertraline (ZOLOFT) 100 MG tablet TAKE ONE TABLET BY MOUTH EVERY DAY 30 tablet 2   simvastatin (ZOCOR) 10 MG tablet TAKE ONE TABLET BY MOUTH EVERY EVENING 30 tablet 5   valACYclovir (VALTREX) 500 MG tablet TAKE ONE TABLET BY MOUTH EVERY DAY 30 tablet 2   carbamide peroxide (DEBROX) 6.5 %  OTIC solution Place 5 drops into both ears 2 (two) times daily. 15 mL 0   predniSONE (DELTASONE) 10 MG tablet Take 6 tabs for 2 days, 5 tabs for 2 days, 4 tabs for 2 days, 3 tabs for 2 days, 2 tabs for 2 days and 1 tab of 2 days 42 tablet 0   sertraline (ZOLOFT) 100 MG tablet TAKE ONE TABLET BY MOUTH EVERY DAY 30 tablet 2   No facility-administered medications prior to visit.    Allergies  Allergen Reactions   Lisinopril Cough    ROS Review of Systems  Constitutional: Negative.   Respiratory: Negative.    Cardiovascular: Negative.   Gastrointestinal: Negative.   Genitourinary:  Positive for dysuria, frequency and urgency.     Objective:    Physical Exam HENT:     Head: Atraumatic.  Cardiovascular:     Rate and Rhythm: Normal rate and regular rhythm.     Pulses: Normal pulses.     Heart sounds: Normal heart sounds.  Pulmonary:     Effort: Pulmonary effort is normal.     Breath sounds: Normal breath sounds.  Abdominal:     General: Bowel sounds are normal.     Palpations: Abdomen is soft.     Tenderness: There is no right CVA tenderness or left CVA tenderness.  Neurological:     General: No focal deficit present.     Mental Status: She is alert and oriented to person, place, and time. Mental status is at baseline.  Psychiatric:        Mood and Affect: Mood normal.        Behavior: Behavior normal.        Thought Content: Thought content normal.        Judgment: Judgment normal.    BP 122/71 (BP Location: Right Arm, Patient Position: Sitting, Cuff Size: Large)   Pulse 85   Temp (!) 97.2 F (36.2 C)   Resp 16   Ht 5' 6"  (1.676 m)   Wt 196 lb 9.6 oz (89.2 kg)   SpO2 97%   BMI 31.73 kg/m  Wt Readings from Last 3 Encounters:  10/23/20 196 lb 9.6 oz (89.2 kg)  10/09/20 195 lb 1.7 oz (88.5 kg)  09/27/20 195 lb (88.5 kg)   Encouraged weight lose  Health Maintenance Due  Topic Date Due   COVID-19 Vaccine (1) Never done   Pneumococcal Vaccine 30-28 Years old (1 -  PCV) Never done   HIV Screening  Never done   Hepatitis C Screening  Never done   TETANUS/TDAP  Never done   Zoster Vaccines- Shingrix (1 of 2)  Never done   PAP SMEAR-Modifier  Never done    There are no preventive care reminders to display for this patient.  Lab Results  Component Value Date   TSH 1.940 12/27/2018   Lab Results  Component Value Date   WBC 3.6 08/13/2020   HGB 12.2 08/13/2020   HCT 36.3 08/13/2020   MCV 88 08/13/2020   PLT 279 08/13/2020   Lab Results  Component Value Date   NA 140 08/13/2020   K 4.5 08/13/2020   CO2 24 08/13/2020   GLUCOSE 84 08/13/2020   BUN 14 08/13/2020   CREATININE 0.73 08/13/2020   BILITOT 0.3 08/13/2020   ALKPHOS 64 08/13/2020   AST 11 08/13/2020   ALT 10 08/13/2020   PROT 7.3 08/13/2020   ALBUMIN 4.7 08/13/2020   CALCIUM 9.4 08/13/2020   EGFR 93 08/13/2020   Lab Results  Component Value Date   CHOL 159 08/13/2020   Lab Results  Component Value Date   HDL 56 08/13/2020   Lab Results  Component Value Date   LDLCALC 89 08/13/2020   Lab Results  Component Value Date   TRIG 72 08/13/2020   Lab Results  Component Value Date   CHOLHDL 2.8 08/13/2020   Lab Results  Component Value Date   HGBA1C 5.8 (H) 08/13/2020      Assessment & Plan:    1. Urinary frequency -She will be treated prophylactically with Macrobid. She was educated on medication side effects and advised to notify clinic.  -She was advised to increase water intake and proper perineal hygiene. -She was advised to go to the ED for worsening symptoms. - UA/M w/rflx Culture, Routine; Future - nitrofurantoin, macrocrystal-monohydrate, (MACROBID) 100 MG capsule; Take 1 capsule (100 mg total) by mouth 2 (two) times daily.  Dispense: 10 capsule; Refill: 0 - UA/M w/rflx Culture, Routine  2. Dysuria -Same as #1 - nitrofurantoin, macrocrystal-monohydrate, (MACROBID) 100 MG capsule; Take 1 capsule (100 mg total) by mouth 2 (two) times daily.  Dispense: 10  capsule; Refill: 0      Follow-up: Return in about 4 weeks (around 11/20/2020), or if symptoms worsen or fail to improve.    Idonna Heeren Jerold Coombe, NP

## 2020-10-24 ENCOUNTER — Other Ambulatory Visit: Payer: Self-pay

## 2020-10-30 LAB — UA/M W/RFLX CULTURE, ROUTINE
Bilirubin, UA: NEGATIVE
Glucose, UA: NEGATIVE
Ketones, UA: NEGATIVE
Nitrite, UA: NEGATIVE
Protein,UA: NEGATIVE
Specific Gravity, UA: 1.015 (ref 1.005–1.030)
Urobilinogen, Ur: 1 mg/dL (ref 0.2–1.0)
pH, UA: 5.5 (ref 5.0–7.5)

## 2020-10-30 LAB — URINE CULTURE, REFLEX

## 2020-10-30 LAB — MICROSCOPIC EXAMINATION
Casts: NONE SEEN /lpf
WBC, UA: >30 /hpf — AB (ref 0–5)

## 2020-11-10 ENCOUNTER — Other Ambulatory Visit: Payer: Self-pay

## 2020-11-10 ENCOUNTER — Other Ambulatory Visit: Payer: Self-pay | Admitting: Gerontology

## 2020-11-10 DIAGNOSIS — B009 Herpesviral infection, unspecified: Secondary | ICD-10-CM

## 2020-11-11 ENCOUNTER — Other Ambulatory Visit: Payer: Self-pay | Admitting: Gerontology

## 2020-11-11 ENCOUNTER — Other Ambulatory Visit: Payer: Self-pay

## 2020-11-11 DIAGNOSIS — B009 Herpesviral infection, unspecified: Secondary | ICD-10-CM

## 2020-11-11 MED FILL — Valacyclovir HCl Tab 500 MG: ORAL | 5 days supply | Qty: 10 | Fill #0 | Status: CN

## 2020-11-20 ENCOUNTER — Ambulatory Visit: Payer: Self-pay | Admitting: Gerontology

## 2020-11-20 ENCOUNTER — Encounter: Payer: Self-pay | Admitting: Gerontology

## 2020-11-20 ENCOUNTER — Other Ambulatory Visit: Payer: Self-pay

## 2020-11-20 VITALS — BP 113/72 | HR 81 | Temp 97.3°F | Resp 18 | Ht 66.0 in | Wt 199.5 lb

## 2020-11-20 DIAGNOSIS — Z8619 Personal history of other infectious and parasitic diseases: Secondary | ICD-10-CM

## 2020-11-20 MED ORDER — VALACYCLOVIR HCL 500 MG PO TABS
1000.0000 mg | ORAL_TABLET | Freq: Every day | ORAL | 2 refills | Status: DC
  Filled 2020-11-20: qty 60, 30d supply, fill #0
  Filled 2020-12-10: qty 60, 30d supply, fill #1
  Filled 2021-01-20: qty 60, 30d supply, fill #2

## 2020-11-20 NOTE — Progress Notes (Signed)
Established Patient Office Visit  Subjective:  Patient ID: Elaine Robinson, female    DOB: [birth year]  Age: 65 y.o. MRN: [id number]  CC:  Chief Complaint  Patient presents with   Follow-up    Labs drawn 10/23/20   Hypertension   Prediabetes    HPI Elaine Robinson is a 65 year old female who has history of arthritis, depression, hyperlipidemia, hypertension, presents for routine follow-up visit and complaint of genital herpes outbreak.  She reports that she completed a course of antibiotics for for urinary tract infection.  Currently, she denies dysuria, urinary frequency/urgency, flank/pelvic pain, fever and chills. She states that she is compliant with her medications, denies side effect and continues to make healthy lifestyle changes. She reports having genital herpes outbreak since 4 days ago and was out of Valacyclovir.  She denies chest pain, palpitation, lightheadedness, vision changes, peripheral edema and myalgia.  Overall, she states that she is doing well and offers no further complaint.  Past Medical History:  Diagnosis Date   Arthritis    hands left knee   Depression    Hyperlipidemia    Hypertension    MMT (medial meniscus tear)    left knee    Past Surgical History:  Procedure Laterality Date   ABDOMINAL HYSTERECTOMY     BREAST BIOPSY Right 05/03/2012   stereo bx   BREAST BIOPSY Left    benign-years ago per pt   COLONOSCOPY WITH PROPOFOL N/A 09/21/2019   Procedure: COLONOSCOPY WITH PROPOFOL;  Surgeon: Jonathon Bellows, MD;  Location: [facility] ENDOSCOPY;  Service: Gastroenterology;  Laterality: N/A;   KNEE ARTHROSCOPY Left 06/28/2019   Procedure: LEFT KNEE ARTHROSCOPY WITH PARTIAL MEDIAL MENISCECTOMY;  Surgeon: Mcarthur Rossetti, MD;  Location: [facility];  Service: Orthopedics;  Laterality: Left;    Family History  Problem Relation Age of Onset   Heart disease Mother    Hypertension Mother    Heart disease Father    Hypertension Father    Diabetes  Sister    Stroke Sister    Hypertension Sister    Schizophrenia Brother    Hyperlipidemia Brother    Hypertension Brother    Diabetes Maternal Aunt    Congestive Heart Failure Brother    Hypertension Brother    Diabetes Brother        pre-diabetic   Breast cancer Neg Hx     Social History   Socioeconomic History   Marital status: Widowed    Spouse name: Not on file   Number of children: 1   Years of education: 11th grade   Highest education level: 11th grade  Occupational History   Occupation: unemployed  Tobacco Use   Smoking status: Former    Packs/day: 0.00    Years: 45.00    Pack years: 0.00    Types: Cigarettes    Quit date: 07/02/2019    Years since quitting: 1.3   Smokeless tobacco: Never  Vaping Use   Vaping Use: Never used  Substance and Sexual Activity   Alcohol use: Not Currently    Comment: last use 01/2017   Drug use: Not Currently    Types: Cocaine    Comment: last use 01/2017   Sexual activity: Not Currently    Birth control/protection: None, Surgical  Other Topics Concern   Not on file  Social History Narrative   Social Determinants screening completed on 07/10/2019. Patient does not require a referral to [facility] at this point in time.    Social Determinants of  Health   Financial Resource Strain: Not on file  Food Insecurity: No Food Insecurity   Worried About [place] in the Last Year: Never true   Ran Out of Food in the Last Year: Never true  Transportation Needs: No Transportation Needs   Lack of Transportation (Medical): No   Lack of Transportation (Non-Medical): No  Physical Activity: Not on file  Stress: No Stress Concern Present   Feeling of Stress : Only a little  Social Connections: Not on file  Intimate Partner Violence: Not on file    Outpatient Medications Prior to Visit  Medication Sig Dispense Refill   amLODipine (NORVASC) 5 MG tablet TAKE ONE TABLET BY MOUTH EVERY DAY 30 tablet 5   aspirin 81 MG chewable tablet  CHEW ONE TABLET BY MOUTH EVERY DAY 30 tablet 4   sertraline (ZOLOFT) 100 MG tablet TAKE ONE TABLET BY MOUTH EVERY DAY 30 tablet 2   simvastatin (ZOCOR) 10 MG tablet TAKE ONE TABLET BY MOUTH EVERY EVENING 30 tablet 5   nitrofurantoin, macrocrystal-monohydrate, (MACROBID) 100 MG capsule Take 1 capsule (100 mg total) by mouth 2 (two) times daily for 5 days. 10 capsule 0   valACYclovir (VALTREX) 500 MG tablet Take 2 tablets (1,000 mg total) by mouth once daily for 5 days. 10 tablet 0   No facility-administered medications prior to visit.    Allergies  Allergen Reactions   Lisinopril Cough    ROS Review of Systems  Constitutional: Negative.   Eyes: Negative.   Respiratory: Negative.    Cardiovascular: Negative.   Genitourinary: Negative.   Musculoskeletal: Negative.  Negative for arthralgias.  Skin: Negative.   Neurological: Negative.   Psychiatric/Behavioral: Negative.       Objective:    Physical Exam HENT:     Head: Normocephalic and atraumatic.     Mouth/Throat:     Mouth: Mucous membranes are moist.  Cardiovascular:     Rate and Rhythm: Normal rate and regular rhythm.     Pulses: Normal pulses.     Heart sounds: Normal heart sounds.  Pulmonary:     Effort: Pulmonary effort is normal.     Breath sounds: Normal breath sounds.  Abdominal:     General: Bowel sounds are normal.     Palpations: Abdomen is soft.     Tenderness: There is no right CVA tenderness or left CVA tenderness.  Skin:    General: Skin is warm.  Neurological:     General: No focal deficit present.     Mental Status: She is alert and oriented to person, place, and time. Mental status is at baseline.  Psychiatric:        Mood and Affect: Mood normal.        Behavior: Behavior normal.        Thought Content: Thought content normal.        Judgment: Judgment normal.    BP 113/72 (BP Location: Left Arm, Patient Position: Sitting, Cuff Size: Large)   Pulse 81   Temp (!) 97.3 F (36.3 C)   Resp 18    Ht _0  (1.676 m)   Wt 199 lb 8 oz (90.5 kg)   SpO2 96%   BMI 32.20 kg/m  Wt Readings from Last 3 Encounters:  11/20/20 199 lb 8 oz (90.5 kg)  10/23/20 196 lb 9.6 oz (89.2 kg)  10/09/20 195 lb 1.7 oz (88.5 kg)   Encouraged weight loss.  Health Maintenance Due  Topic Date Due  COVID-19 Vaccine (1) Never done   Pneumococcal Vaccine 44-57 Years old (1 - PCV) Never done   HIV Screening  Never done   Hepatitis C Screening  Never done   TETANUS/TDAP  Never done   Zoster Vaccines- Shingrix (1 of 2) Never done   PAP SMEAR-Modifier  Never done    There are no preventive care reminders to display for this patient.  Lab Results  Component Value Date   TSH 1.940 12/27/2018   Lab Results  Component Value Date   WBC 3.6 08/13/2020   HGB 12.2 08/13/2020   HCT 36.3 08/13/2020   MCV 88 08/13/2020   PLT 279 08/13/2020   Lab Results  Component Value Date   NA 140 08/13/2020   K 4.5 08/13/2020   CO2 24 08/13/2020   GLUCOSE 84 08/13/2020   BUN 14 08/13/2020   CREATININE 0.73 08/13/2020   BILITOT 0.3 08/13/2020   ALKPHOS 64 08/13/2020   AST 11 08/13/2020   ALT 10 08/13/2020   PROT 7.3 08/13/2020   ALBUMIN 4.7 08/13/2020   CALCIUM 9.4 08/13/2020   EGFR 93 08/13/2020   Lab Results  Component Value Date   CHOL 159 08/13/2020   Lab Results  Component Value Date   HDL 56 08/13/2020   Lab Results  Component Value Date   LDLCALC 89 08/13/2020   Lab Results  Component Value Date   TRIG 72 08/13/2020   Lab Results  Component Value Date   CHOLHDL 2.8 08/13/2020   Lab Results  Component Value Date   HGBA1C 5.8 (H) 08/13/2020      Assessment & Plan:   1. History of herpes genitalis -She will continue on current medication, was educated on side effects and advised to notify the clinic.  She was advised to avoid sexual intercourse during outbreak. - valACYclovir (VALTREX) 500 MG tablet; Take 2 tablets (1,000 mg total) by mouth daily.  Dispense: 60 tablet; Refill:  2     Follow-up: Return in about 13 weeks (around 02/19/2021).    Dawayne Ohair Jerold Coombe, NP

## 2020-11-20 NOTE — Patient Instructions (Signed)
https://www.nhlbi.nih.gov/files/docs/public/heart/dash_brief.pdf">  DASH Eating Plan DASH stands for Dietary Approaches to Stop Hypertension. The DASH eating plan is a healthy eating plan that has been shown to: Reduce high blood pressure (hypertension). Reduce your risk for type 2 diabetes, heart disease, and stroke. Help with weight loss. What are tips for following this plan? Reading food labels Check food labels for the amount of salt (sodium) per serving. Choose foods with less than 5 percent of the Daily Value of sodium. Generally, foods with less than 300 milligrams (mg) of sodium per serving fit into this eating plan. To find whole grains, look for the word "whole" as the first word in the ingredient list. Shopping Buy products labeled as "low-sodium" or "no salt added." Buy fresh foods. Avoid canned foods and pre-made or frozen meals. Cooking Avoid adding salt when cooking. Use salt-free seasonings or herbs instead of table salt or sea salt. Check with your health care provider or pharmacist before using salt substitutes. Do not fry foods. Cook foods using healthy methods such as baking, boiling, grilling, roasting, and broiling instead. Cook with heart-healthy oils, such as olive, canola, avocado, soybean, or sunflower oil. Meal planning  Eat a balanced diet that includes: 4 or more servings of fruits and 4 or more servings of vegetables each day. Try to fill one-half of your plate with fruits and vegetables. 6-8 servings of whole grains each day. Less than 6 oz (170 g) of lean meat, poultry, or fish each day. A 3-oz (85-g) serving of meat is about the same size as a deck of cards. One egg equals 1 oz (28 g). 2-3 servings of low-fat dairy each day. One serving is 1 cup (237 mL). 1 serving of nuts, seeds, or beans 5 times each week. 2-3 servings of heart-healthy fats. Healthy fats called omega-3 fatty acids are found in foods such as walnuts, flaxseeds, fortified milks, and eggs.  These fats are also found in cold-water fish, such as sardines, salmon, and mackerel. Limit how much you eat of: Canned or prepackaged foods. Food that is high in trans fat, such as some fried foods. Food that is high in saturated fat, such as fatty meat. Desserts and other sweets, sugary drinks, and other foods with added sugar. Full-fat dairy products. Do not salt foods before eating. Do not eat more than 4 egg yolks a week. Try to eat at least 2 vegetarian meals a week. Eat more home-cooked food and less restaurant, buffet, and fast food.  Lifestyle When eating at a restaurant, ask that your food be prepared with less salt or no salt, if possible. If you drink alcohol: Limit how much you use to: 0-1 drink a day for women who are not pregnant. 0-2 drinks a day for men. Be aware of how much alcohol is in your drink. In the [place] one drink equals one 12 oz bottle of beer (355 mL), one 5 oz glass of wine (148 mL), or one 1 oz glass of hard liquor (44 mL). General information Avoid eating more than 2,300 mg of salt a day. If you have hypertension, you may need to reduce your sodium intake to 1,500 mg a day. Work with your health care provider to maintain a healthy body weight or to lose weight. Ask what an ideal weight is for you. Get at least 30 minutes of exercise that causes your heart to beat faster (aerobic exercise) most days of the week. Activities may include walking, swimming, or biking. Work with your health care provider   or dietitian to adjust your eating plan to your individual calorie needs. What foods should I eat? Fruits All fresh, dried, or frozen fruit. Canned fruit in natural juice (without addedsugar). Vegetables Fresh or frozen vegetables (raw, steamed, roasted, or grilled). Low-sodium or reduced-sodium tomato and vegetable juice. Low-sodium or reduced-sodium tomatosauce and tomato paste. Low-sodium or reduced-sodium canned vegetables. Grains Whole-grain or  whole-wheat bread. Whole-grain or whole-wheat pasta. Brown rice. Oatmeal. Quinoa. Bulgur. Whole-grain and low-sodium cereals. Pita bread.Low-fat, low-sodium crackers. Whole-wheat flour tortillas. Meats and other proteins Skinless chicken or turkey. Ground chicken or turkey. Pork with fat trimmed off. Fish and seafood. Egg whites. Dried beans, peas, or lentils. Unsalted nuts, nut butters, and seeds. Unsalted canned beans. Lean cuts of beef with fat trimmed off. Low-sodium, lean precooked or cured meat, such as sausages or meatloaves. Dairy Low-fat (1%) or fat-free (skim) milk. Reduced-fat, low-fat, or fat-free cheeses. Nonfat, low-sodium ricotta or cottage cheese. Low-fat or nonfatyogurt. Low-fat, low-sodium cheese. Fats and oils Soft margarine without trans fats. Vegetable oil. Reduced-fat, low-fat, or light mayonnaise and salad dressings (reduced-sodium). Canola, safflower, olive, avocado, soybean, andsunflower oils. Avocado. Seasonings and condiments Herbs. Spices. Seasoning mixes without salt. Other foods Unsalted popcorn and pretzels. Fat-free sweets. The items listed above may not be a complete list of foods and beverages you can eat. Contact a dietitian for more information. What foods should I avoid? Fruits Canned fruit in a light or heavy syrup. Fried fruit. Fruit in cream or buttersauce. Vegetables Creamed or fried vegetables. Vegetables in a cheese sauce. Regular canned vegetables (not low-sodium or reduced-sodium). Regular canned tomato sauce and paste (not low-sodium or reduced-sodium). Regular tomato and vegetable juice(not low-sodium or reduced-sodium). Pickles. Olives. Grains Baked goods made with fat, such as croissants, muffins, or some breads. Drypasta or rice meal packs. Meats and other proteins Fatty cuts of meat. Ribs. Fried meat. Bacon. Bologna, salami, and other precooked or cured meats, such as sausages or meat loaves. Fat from the back of a pig (fatback). Bratwurst.  Salted nuts and seeds. Canned beans with added salt. Canned orsmoked fish. Whole eggs or egg yolks. Chicken or turkey with skin. Dairy Whole or 2% milk, cream, and half-and-half. Whole or full-fat cream cheese. Whole-fat or sweetened yogurt. Full-fat cheese. Nondairy creamers. Whippedtoppings. Processed cheese and cheese spreads. Fats and oils Butter. Stick margarine. Lard. Shortening. Ghee. Bacon fat. Tropical oils, suchas coconut, palm kernel, or palm oil. Seasonings and condiments Onion salt, garlic salt, seasoned salt, table salt, and sea salt. Worcestershire sauce. Tartar sauce. Barbecue sauce. Teriyaki sauce. Soy sauce, including reduced-sodium. Steak sauce. Canned and packaged gravies. Fish sauce. Oyster sauce. Cocktail sauce. Store-bought horseradish. Ketchup. Mustard. Meat flavorings and tenderizers. Bouillon cubes. Hot sauces. Pre-made or packaged marinades. Pre-made or packaged taco seasonings. Relishes. Regular saladdressings. Other foods Salted popcorn and pretzels. The items listed above may not be a complete list of foods and beverages you should avoid. Contact a dietitian for more information. Where to find more information National Heart, Lung, and Blood Institute: www.nhlbi.nih.gov American Heart Association: www.heart.org Academy of Nutrition and Dietetics: www.eatright.org National Kidney Foundation: www.kidney.org Summary The DASH eating plan is a healthy eating plan that has been shown to reduce high blood pressure (hypertension). It may also reduce your risk for type 2 diabetes, heart disease, and stroke. When on the DASH eating plan, aim to eat more fresh fruits and vegetables, whole grains, lean proteins, low-fat dairy, and heart-healthy fats. With the DASH eating plan, you should limit salt (sodium) intake to 2,300   mg a day. If you have hypertension, you may need to reduce your sodium intake to 1,500 mg a day. Work with your health care provider or dietitian to adjust  your eating plan to your individual calorie needs. This information is not intended to replace advice given to you by your health care provider. Make sure you discuss any questions you have with your healthcare provider. Document Revised: 03/16/2019 Document Reviewed: 03/16/2019 Elsevier Patient Education  2022 Elsevier Inc.  

## 2020-11-21 ENCOUNTER — Other Ambulatory Visit: Payer: Self-pay

## 2020-11-24 ENCOUNTER — Other Ambulatory Visit: Payer: Self-pay

## 2020-11-24 ENCOUNTER — Other Ambulatory Visit: Payer: Self-pay | Admitting: Gerontology

## 2020-11-24 DIAGNOSIS — F411 Generalized anxiety disorder: Secondary | ICD-10-CM

## 2020-11-25 ENCOUNTER — Other Ambulatory Visit: Payer: Self-pay

## 2020-11-25 ENCOUNTER — Ambulatory Visit: Payer: Self-pay | Admitting: Licensed Clinical Social Worker

## 2020-11-25 ENCOUNTER — Other Ambulatory Visit: Payer: Self-pay | Admitting: Gerontology

## 2020-11-25 DIAGNOSIS — F411 Generalized anxiety disorder: Secondary | ICD-10-CM

## 2020-11-25 DIAGNOSIS — F330 Major depressive disorder, recurrent, mild: Secondary | ICD-10-CM

## 2020-11-25 NOTE — BH Specialist Note (Signed)
Integrated Behavioral Health Follow Up In-Person Visit  MRN: [id number] Name: Elaine Robinson   Total time: 60 minutes  Types of Service: Telephone visitPatient consents to telephone visit and 2 patient identifiers were used to identify patient   Interpretor:No. Interpretor Name and Language: N/A  Subjective: Elaine Robinson is a 65 y.o. female accompanied by  herself Patient was referred by Carlyon Shadow, NP for mental health. Patient reports the following symptoms/concerns: Elaine Robinson reports that she has been doing very well since her last follow-up visit. She explained that she is low on her prescription of Zoloft and needs a refill. She stated that she also needs a refill for her amlodipine. Elaine Robinson noted that she is sleeping well each night. She reports she did gain some weight and started to monitor her diet last week. She explained that her daughter is helping her to make good diet choices. She shared that she is exercising everyday by walking. She notes that she has a new great granddaughter who she calls "Little Honey"  and she is excited to be able to babysit her during the week. She shared that she is proud of how her grandson has steeped up and works hard to be a good dad. Overall Elaine Robinson feels her life is going very well and she is using her Faith and her family as supports. The patient denied any suicidal or homicidal thoughts.  Duration of problem: Years; Severity of problem: moderate  Objective: Mood: Euthymic and Affect: Appropriate Risk of harm to self or others: No plan to harm self or others  Life Context: Family and Social: see above School/Work: see above Self-Care: see above Life Changes: see above  Patient and/or Family's Strengths/Protective Factors: Concrete supports in place (healthy food, safe environments, etc.) and Sense of purpose  Goals Addressed: Patient will:  Reduce symptoms of: anxiety, depression, and stress   Increase knowledge and/or  ability of: coping skills   Demonstrate ability to: Increase healthy adjustment to current life circumstances  Progress towards Goals: Ongoing  Interventions: Interventions utilized:  CBT Cognitive Behavioral Therapywas utilized by the clinician during today's follow up session. The clinician processed with the patient how they have been doing since the last follow-up session. The clinician provided a space for the patient to ventilate their frustrations regarding their current life circumstances. Clinician measured the patient's anxiety and depression on a numerical scale.  Clinician congratulated the patient on her new great-granddaughter. Clinician offered to message her primary care provider regarding her medication refill requests. Clinician The clinician encouraged the patient to continue to utilize their coping skills to deal with their current life circumstances.   Standardized Assessments completed: GAD-7 and PHQ 9 PHQ-9    5 GAD-7    6   Assessment: Patient currently experiencing see above.   Patient may benefit from see above.  Plan: Follow up with behavioral health clinician on : 01/20/2021 @ 9:00 AM Behavioral recommendations:  Referral(s): [place] (In Clinic) "From scale of 1-10, how likely are you to follow plan?":   Lesli Albee, LCSWA

## 2020-11-26 ENCOUNTER — Other Ambulatory Visit: Payer: Self-pay

## 2020-12-04 ENCOUNTER — Other Ambulatory Visit: Payer: Self-pay

## 2020-12-09 ENCOUNTER — Other Ambulatory Visit: Payer: Self-pay

## 2020-12-10 ENCOUNTER — Other Ambulatory Visit: Payer: Self-pay

## 2020-12-25 ENCOUNTER — Other Ambulatory Visit: Payer: Self-pay

## 2021-01-05 ENCOUNTER — Other Ambulatory Visit: Payer: Self-pay

## 2021-01-06 ENCOUNTER — Other Ambulatory Visit: Payer: Self-pay

## 2021-01-13 ENCOUNTER — Encounter (INDEPENDENT_AMBULATORY_CARE_PROVIDER_SITE_OTHER): Payer: Self-pay

## 2021-01-13 ENCOUNTER — Ambulatory Visit
Admission: RE | Admit: 2021-01-13 | Discharge: 2021-01-13 | Disposition: A | Discharge status: Home / Self Care | Payer: Self-pay | Source: Ambulatory Visit | Attending: Oncology | Admitting: Oncology

## 2021-01-13 ENCOUNTER — Ambulatory Visit: Payer: Self-pay | Attending: Oncology

## 2021-01-13 ENCOUNTER — Other Ambulatory Visit: Payer: Self-pay

## 2021-01-13 VITALS — BP 123/83 | HR 80 | Temp 98.3°F | Resp 18 | Wt 201.2 lb

## 2021-01-13 DIAGNOSIS — R920 Mammographic microcalcification found on diagnostic imaging of breast: Secondary | ICD-10-CM

## 2021-01-13 NOTE — Progress Notes (Signed)
  Subjective:     Patient ID: Elaine Robinson, female   DOB: 1956/04/04, 65 y.o.   MRN: [id number]  Gynecologic Exam    Review of Systems     Objective:   Physical Exam Chest:  Breasts:    Right: No swelling, bleeding, inverted nipple, mass, nipple discharge, skin change or tenderness.     Left: No swelling, bleeding, inverted nipple, mass, nipple discharge, skin change or tenderness.       Assessment:     65 year old patient returns for BCCCP and follow-up bilateral breast calcifications.  Patient screened, and meets BCCCP eligibility.  Patient does not have insurance, Medicare or Medicaid.  Instructed patient on breast self awareness using teach back method.  Clinical breast exam unremarkable. No mass or lump palpated.   Risk Assessment     Risk Scores       01/13/2021 12/26/2019   Last edited by: Velna Hatchet, CMA Elaine Demark, RN   5-year risk:     Lifetime risk:                 Plan:     Sent for bilateral screening mammogram.

## 2021-01-20 ENCOUNTER — Other Ambulatory Visit: Payer: Self-pay

## 2021-01-20 ENCOUNTER — Other Ambulatory Visit: Payer: Self-pay | Admitting: Gerontology

## 2021-01-20 ENCOUNTER — Encounter: Payer: Self-pay | Admitting: Licensed Clinical Social Worker

## 2021-01-20 ENCOUNTER — Ambulatory Visit: Payer: Self-pay | Admitting: Licensed Clinical Social Worker

## 2021-01-20 DIAGNOSIS — F411 Generalized anxiety disorder: Secondary | ICD-10-CM

## 2021-01-20 DIAGNOSIS — F331 Major depressive disorder, recurrent, moderate: Secondary | ICD-10-CM

## 2021-01-20 MED ORDER — SERTRALINE HCL 100 MG PO TABS
ORAL_TABLET | Freq: Every day | ORAL | 2 refills | Status: DC
  Filled 2021-01-20: qty 30, 30d supply, fill #0
  Filled 2021-02-16: qty 30, 30d supply, fill #1
  Filled 2021-03-17: qty 30, 30d supply, fill #2

## 2021-01-20 NOTE — BH Specialist Note (Signed)
Integrated Behavioral Health Follow Up In Person Visit  MRN: [id number] Name: Elaine Robinson  Total time: 30 minutes  Types of Service: Individual psychotherapy  Interpretor:No. Interpretor Name and Language: N/A  Subjective: Elaine Robinson is a 65 y.o. female accompanied by  Herself Patient was referred by Carlyon Shadow, Np  for mental health. Patient reports the following symptoms/concerns: The patient reports that she has been doing okay since her last follow-up appointment. She reports that she attends church twice a week and overall feels that her strength to make it through difficult times in her life comes from her faith. She discussed familial stressors impacting her life. Specifically, Elaine Robinson noted that her relationship with her adult grandson is causing her distress. She explained that he moved in with his girlfriend and despite making more money than her asks her for money. She shared her daughter and other grandchildren are independent and  treat her very respectfully. She shared that her daughter has tried to help, but has recently walked away from the situation. Elaine Robinson shared that she feels she is all her grandson has left and without her he will be homeless or worse and she felt bad for telling him that he has to move out by this upcoming Friday. She noted that she couldn't take it anymore and felt she had no choice but to tell him to go. Elaine Robinson shared that she is taking Zoloft 100 MG as prescribed. She denied sleep disturbances or fatigue. She shared she cries throughout the day, feels down and depressed, has difficulty concentrating, and has lost interest in activities she previously enjoyed. Elaine Robinson denied any suicidal or homicidal thoughts.  Duration of problem: Years; Severity of problem: moderate  Objective: Mood: Euthymic and Affect: Appropriate Risk of harm to self or others: No plan to harm self or others  Life Context: Family and Social: see  above School/Work: see above Self-Care: see above Life Changes: see above  Patient and/or Family's Strengths/Protective Factors: Concrete supports in place (healthy food, safe environments, etc.) and Sense of purpose  Goals Addressed: Patient will:  Reduce symptoms of: agitation, anxiety, depression, and stress   Increase knowledge and/or ability of: coping skills, healthy habits, self-management skills, and stress reduction   Demonstrate ability to: Increase healthy adjustment to current life circumstances and Increase adequate support systems for patient/family  Progress towards Goals: Ongoing  Interventions: Interventions utilized:  CBT Cognitive Behavioral Therapy was utilized by the clinician during today's follow up session. The clinician processed with the patient how they have been doing since the last follow-up session. Clinician worked with the patient to identify needs related to stressors and functioning, and assess and monitor for signs and symptoms of anxiety and depression, and assess safety.The clinician provided a space for the patient to ventilate their frustrations regarding their current life circumstances. Clinician introduced distress tolerance skills to the patient and explained that negative emotions will usually lessen in intensity and pass over time. Clinician measured the patient's anxiety and depression on a numerical scale. Clinician encouraged the client to continue to use her coping skills, to continue to draw strength from her faith, and to reach out to her religous leaders for additional encouragement and support.    Standardized Assessments completed: GAD-7 and PHQ 9 PHQ-9=  14 GAD-7=  13   Assessment: Patient currently experiencing see above.   Patient may benefit from see above see above.  Plan: Follow up with behavioral health clinician on : 02/03/2021  Behavioral recommendations:  Referral(s): [place] (In  Clinic) "From scale of 1-10, how likely are you to follow plan?":   Lesli Albee, LCSWA

## 2021-01-21 ENCOUNTER — Other Ambulatory Visit: Payer: Self-pay

## 2021-01-21 NOTE — Progress Notes (Signed)
This encounter was created in error - please disregard.

## 2021-02-03 ENCOUNTER — Other Ambulatory Visit: Payer: Self-pay

## 2021-02-04 ENCOUNTER — Other Ambulatory Visit: Payer: Self-pay

## 2021-02-16 ENCOUNTER — Other Ambulatory Visit: Payer: Self-pay

## 2021-02-16 ENCOUNTER — Other Ambulatory Visit: Payer: Self-pay | Admitting: Gerontology

## 2021-02-16 DIAGNOSIS — I10 Essential (primary) hypertension: Secondary | ICD-10-CM

## 2021-02-16 DIAGNOSIS — Z8619 Personal history of other infectious and parasitic diseases: Secondary | ICD-10-CM

## 2021-02-17 ENCOUNTER — Other Ambulatory Visit: Payer: Self-pay

## 2021-02-17 MED ORDER — ASPIRIN 81 MG PO CHEW
CHEWABLE_TABLET | ORAL | 4 refills | Status: DC
  Filled 2021-02-17: qty 30, 30d supply, fill #0

## 2021-02-17 MED ORDER — VALACYCLOVIR HCL 500 MG PO TABS
1000.0000 mg | ORAL_TABLET | Freq: Every day | ORAL | 2 refills | Status: DC
  Filled 2021-02-17: qty 60, 30d supply, fill #0

## 2021-02-18 ENCOUNTER — Other Ambulatory Visit: Payer: Self-pay

## 2021-02-19 ENCOUNTER — Ambulatory Visit: Payer: Self-pay | Admitting: Gerontology

## 2021-02-26 ENCOUNTER — Ambulatory Visit: Payer: Self-pay | Admitting: Gerontology

## 2021-03-03 ENCOUNTER — Other Ambulatory Visit: Payer: Self-pay

## 2021-03-03 ENCOUNTER — Ambulatory Visit: Payer: Self-pay | Admitting: Gerontology

## 2021-03-03 ENCOUNTER — Encounter: Payer: Self-pay | Admitting: Gerontology

## 2021-03-03 VITALS — BP 117/77 | HR 74 | Temp 98.8°F | Ht 66.0 in | Wt 205.5 lb

## 2021-03-03 DIAGNOSIS — K068 Other specified disorders of gingiva and edentulous alveolar ridge: Secondary | ICD-10-CM | POA: Insufficient documentation

## 2021-03-03 DIAGNOSIS — Z0000 Encounter for general adult medical examination without abnormal findings: Secondary | ICD-10-CM

## 2021-03-03 DIAGNOSIS — I10 Essential (primary) hypertension: Secondary | ICD-10-CM

## 2021-03-03 DIAGNOSIS — E785 Hyperlipidemia, unspecified: Secondary | ICD-10-CM

## 2021-03-03 MED ORDER — AMLODIPINE BESYLATE 5 MG PO TABS
ORAL_TABLET | Freq: Every day | ORAL | 1 refills | Status: DC
  Filled 2021-03-03: qty 13, 13d supply, fill #0
  Filled 2021-03-03: qty 90, fill #0
  Filled 2021-03-17: qty 30, 30d supply, fill #1
  Filled 2021-04-16: qty 30, 30d supply, fill #2
  Filled 2021-05-18: qty 30, 30d supply, fill #3
  Filled 2021-06-16: qty 30, 30d supply, fill #4
  Filled 2021-07-14: qty 30, 30d supply, fill #5

## 2021-03-03 MED ORDER — SIMVASTATIN 10 MG PO TABS
ORAL_TABLET | Freq: Every evening | ORAL | 1 refills | Status: DC
Start: 2021-03-03 — End: 2021-08-19
  Filled 2021-03-03: qty 90, fill #0
  Filled 2021-03-17: qty 30, 30d supply, fill #0
  Filled 2021-04-16: qty 30, 30d supply, fill #1
  Filled 2021-05-18: qty 30, 30d supply, fill #2
  Filled 2021-06-16: qty 30, 30d supply, fill #3
  Filled 2021-07-14: qty 30, 30d supply, fill #4

## 2021-03-03 MED ORDER — ASPIRIN 81 MG PO CHEW
CHEWABLE_TABLET | ORAL | 2 refills | Status: DC
  Filled 2021-03-03: qty 90, fill #0
  Filled 2021-03-17: qty 30, 30d supply, fill #0
  Filled 2021-04-16: qty 30, 30d supply, fill #1
  Filled 2021-05-18: qty 30, 30d supply, fill #2
  Filled 2021-06-15: qty 30, 30d supply, fill #3
  Filled 2021-07-14: qty 30, 30d supply, fill #4

## 2021-03-03 NOTE — Patient Instructions (Signed)
Heart-Healthy Eating Plan Heart-healthy meal planning includes: Eating less unhealthy fats. Eating more healthy fats. Making other changes in your diet. Talk with your doctor or a diet specialist (dietitian) to create an eating plan that is right for you. What is my plan? Your doctor may recommend an eating plan that includes: Total fat: ______% or less of total calories a day. Saturated fat: ______% or less of total calories a day. Cholesterol: less than _________mg a day. What are tips for following this plan? Cooking Avoid frying your food. Try to bake, boil, grill, or broil it instead. You can also reduce fat by: Removing the skin from poultry. Removing all visible fats from meats. Steaming vegetables in water or broth. Meal planning  At meals, divide your plate into four equal parts: Fill one-half of your plate with vegetables and green salads. Fill one-fourth of your plate with whole grains. Fill one-fourth of your plate with lean protein foods. Eat 4-5 servings of vegetables per day. A serving of vegetables is: 1 cup of raw or cooked vegetables. 2 cups of raw leafy greens. Eat 4-5 servings of fruit per day. A serving of fruit is: 1 medium whole fruit.  cup of dried fruit.  cup of fresh, frozen, or canned fruit.  cup of 100% fruit juice. Eat more foods that have soluble fiber. These are apples, broccoli, carrots, beans, peas, and barley. Try to get 20-30 g of fiber per day. Eat 4-5 servings of nuts, legumes, and seeds per week: 1 serving of dried beans or legumes equals  cup after being cooked. 1 serving of nuts is  cup. 1 serving of seeds equals 1 tablespoon. General information Eat more home-cooked food. Eat less restaurant, buffet, and fast food. Limit or avoid alcohol. Limit foods that are high in starch and sugar. Avoid fried foods. Lose weight if you are overweight. Keep track of how much salt (sodium) you eat. This is important if you have high blood  pressure. Ask your doctor to tell you more about this. Try to add vegetarian meals each week. Fats Choose healthy fats. These include olive oil and canola oil, flaxseeds, walnuts, almonds, and seeds. Eat more omega-3 fats. These include salmon, mackerel, sardines, tuna, flaxseed oil, and ground flaxseeds. Try to eat fish at least 2 times each week. Check food labels. Avoid foods with trans fats or high amounts of saturated fat. Limit saturated fats. These are often found in animal products, such as meats, butter, and cream. These are also found in plant foods, such as palm oil, palm kernel oil, and coconut oil. Avoid foods with partially hydrogenated oils in them. These have trans fats. Examples are stick margarine, some tub margarines, cookies, crackers, and other baked goods. What foods can I eat? Fruits All fresh, canned (in natural juice), or frozen fruits. Vegetables Fresh or frozen vegetables (raw, steamed, roasted, or grilled). Green salads. Grains Most grains. Choose whole wheat and whole grains most of the time. Rice and pasta, including brown rice and pastas made with whole wheat. Meats and other proteins Lean, well-trimmed beef, veal, pork, and lamb. Chicken and turkey without skin. All fish and shellfish. Wild duck, rabbit, pheasant, and venison. Egg whites or low-cholesterol egg substitutes. Dried beans, peas, lentils, and tofu. Seeds and most nuts. Dairy Low-fat or nonfat cheeses, including ricotta and mozzarella. Skim or 1% milk that is liquid, powdered, or evaporated. Buttermilk that is made with low-fat milk. Nonfat or low-fat yogurt. Fats and oils Non-hydrogenated (trans-free) margarines. Vegetable oils, including   soybean, sesame, sunflower, olive, peanut, safflower, corn, canola, and cottonseed. Salad dressings or mayonnaise made with a vegetable oil. Beverages Mineral water. Coffee and tea. Diet carbonated beverages. Sweets and desserts Sherbet, gelatin, and fruit ice.  Small amounts of dark chocolate. Limit all sweets and desserts. Seasonings and condiments All seasonings and condiments. The items listed above may not be a complete list of foods and drinks you can eat. Contact a dietitian for more options. What foods should I avoid? Fruits Canned fruit in heavy syrup. Fruit in cream or butter sauce. Fried fruit. Limit coconut. Vegetables Vegetables cooked in cheese, cream, or butter sauce. Fried vegetables. Grains Breads that are made with saturated or trans fats, oils, or whole milk. Croissants. Sweet rolls. Donuts. High-fat crackers, such as cheese crackers. Meats and other proteins Fatty meats, such as hot dogs, ribs, sausage, bacon, rib-eye roast or steak. High-fat deli meats, such as salami and bologna. Caviar. Domestic duck and goose. Organ meats, such as liver. Dairy Cream, sour cream, cream cheese, and creamed cottage cheese. Whole-milk cheeses. Whole or 2% milk that is liquid, evaporated, or condensed. Whole buttermilk. Cream sauce or high-fat cheese sauce. Yogurt that is made from whole milk. Fats and oils Meat fat, or shortening. Cocoa butter, hydrogenated oils, palm oil, coconut oil, palm kernel oil. Solid fats and shortenings, including bacon fat, salt pork, lard, and butter. Nondairy cream substitutes. Salad dressings with cheese or sour cream. Beverages Regular sodas and juice drinks with added sugar. Sweets and desserts Frosting. Pudding. Cookies. Cakes. Pies. Milk chocolate or white chocolate. Buttered syrups. Full-fat ice cream or ice cream drinks. The items listed above may not be a complete list of foods and drinks to avoid. Contact a dietitian for more information. Summary Heart-healthy meal planning includes eating less unhealthy fats, eating more healthy fats, and making other changes in your diet. Eat a balanced diet. This includes fruits and vegetables, low-fat or nonfat dairy, lean protein, nuts and legumes, whole grains, and  heart-healthy oils and fats. This information is not intended to replace advice given to you by your health care provider. Make sure you discuss any questions you have with your health care provider. Document Revised: 08/21/2020 Document Reviewed: 08/21/2020 Elsevier Patient Education  2022 [place] Eating Plan DASH stands for Dietary Approaches to Stop Hypertension. The DASH eating plan is a healthy eating plan that has been shown to: Reduce high blood pressure (hypertension). Reduce your risk for type 2 diabetes, heart disease, and stroke. Help with weight loss. What are tips for following this plan? Reading food labels Check food labels for the amount of salt (sodium) per serving. Choose foods with less than 5 percent of the Daily Value of sodium. Generally, foods with less than 300 milligrams (mg) of sodium per serving fit into this eating plan. To find whole grains, look for the word "whole" as the first word in the ingredient list. Shopping Buy products labeled as "low-sodium" or "no salt added." Buy fresh foods. Avoid canned foods and pre-made or frozen meals. Cooking Avoid adding salt when cooking. Use salt-free seasonings or herbs instead of table salt or sea salt. Check with your health care provider or pharmacist before using salt substitutes. Do not fry foods. Cook foods using healthy methods such as baking, boiling, grilling, roasting, and broiling instead. Cook with heart-healthy oils, such as olive, canola, avocado, soybean, or sunflower oil. Meal planning  Eat a balanced diet that includes: 4 or more servings of fruits and 4 or more  servings of vegetables each day. Try to fill one-half of your plate with fruits and vegetables. 6-8 servings of whole grains each day. Less than 6 oz (170 g) of lean meat, poultry, or fish each day. A 3-oz (85-g) serving of meat is about the same size as a deck of cards. One egg equals 1 oz (28 g). 2-3 servings of low-fat dairy each  day. One serving is 1 cup (237 mL). 1 serving of nuts, seeds, or beans 5 times each week. 2-3 servings of heart-healthy fats. Healthy fats called omega-3 fatty acids are found in foods such as walnuts, flaxseeds, fortified milks, and eggs. These fats are also found in cold-water fish, such as sardines, salmon, and mackerel. Limit how much you eat of: Canned or prepackaged foods. Food that is high in trans fat, such as some fried foods. Food that is high in saturated fat, such as fatty meat. Desserts and other sweets, sugary drinks, and other foods with added sugar. Full-fat dairy products. Do not salt foods before eating. Do not eat more than 4 egg yolks a week. Try to eat at least 2 vegetarian meals a week. Eat more home-cooked food and less restaurant, buffet, and fast food. Lifestyle When eating at a restaurant, ask that your food be prepared with less salt or no salt, if possible. If you drink alcohol: Limit how much you use to: 0-1 drink a day for women who are not pregnant. 0-2 drinks a day for men. Be aware of how much alcohol is in your drink. In the [place] one drink equals one 12 oz bottle of beer (355 mL), one 5 oz glass of wine (148 mL), or one 1 oz glass of hard liquor (44 mL). General information Avoid eating more than 2,300 mg of salt a day. If you have hypertension, you may need to reduce your sodium intake to 1,500 mg a day. Work with your health care provider to maintain a healthy body weight or to lose weight. Ask what an ideal weight is for you. Get at least 30 minutes of exercise that causes your heart to beat faster (aerobic exercise) most days of the week. Activities may include walking, swimming, or biking. Work with your health care provider or dietitian to adjust your eating plan to your individual calorie needs. What foods should I eat? Fruits All fresh, dried, or frozen fruit. Canned fruit in natural juice (without added sugar). Vegetables Fresh or frozen  vegetables (raw, steamed, roasted, or grilled). Low-sodium or reduced-sodium tomato and vegetable juice. Low-sodium or reduced-sodium tomato sauce and tomato paste. Low-sodium or reduced-sodium canned vegetables. Grains Whole-grain or whole-wheat bread. Whole-grain or whole-wheat pasta. Brown rice. Elaine Robinson. Bulgur. Whole-grain and low-sodium cereals. Pita bread. Low-fat, low-sodium crackers. Whole-wheat flour tortillas. Meats and other proteins Skinless chicken or [place]. Ground chicken or [place]. Pork with fat trimmed off. Fish and seafood. Egg whites. Dried beans, peas, or lentils. Unsalted nuts, nut butters, and seeds. Unsalted canned beans. Lean cuts of beef with fat trimmed off. Low-sodium, lean precooked or cured meat, such as sausages or meat loaves. Dairy Low-fat (1%) or fat-free (skim) milk. Reduced-fat, low-fat, or fat-free cheeses. Nonfat, low-sodium ricotta or cottage cheese. Low-fat or nonfat yogurt. Low-fat, low-sodium cheese. Fats and oils Soft margarine without trans fats. Vegetable oil. Reduced-fat, low-fat, or light mayonnaise and salad dressings (reduced-sodium). Canola, safflower, olive, avocado, soybean, and sunflower oils. Avocado. Seasonings and condiments Herbs. Spices. Seasoning mixes without salt. Other foods Unsalted popcorn and pretzels. Fat-free sweets. The items  listed above may not be a complete list of foods and beverages you can eat. Contact a dietitian for more information. What foods should I avoid? Fruits Canned fruit in a light or heavy syrup. Fried fruit. Fruit in cream or butter sauce. Vegetables Creamed or fried vegetables. Vegetables in a cheese sauce. Regular canned vegetables (not low-sodium or reduced-sodium). Regular canned tomato sauce and paste (not low-sodium or reduced-sodium). Regular tomato and vegetable juice (not low-sodium or reduced-sodium). Elaine Robinson. Olives. Grains Baked goods made with fat, such as croissants, muffins, or some  breads. Dry pasta or rice meal packs. Meats and other proteins Fatty cuts of meat. Ribs. Fried meat. Elaine Robinson. Bologna, salami, and other precooked or cured meats, such as sausages or meat loaves. Fat from the back of a pig (fatback). Bratwurst. Salted nuts and seeds. Canned beans with added salt. Canned or smoked fish. Whole eggs or egg yolks. Chicken or [place] with skin. Dairy Whole or 2% milk, cream, and half-and-half. Whole or full-fat cream cheese. Whole-fat or sweetened yogurt. Full-fat cheese. Nondairy creamers. Whipped toppings. Processed cheese and cheese spreads. Fats and oils Butter. Stick margarine. Lard. Shortening. Ghee. Bacon fat. Tropical oils, such as coconut, palm kernel, or palm oil. Seasonings and condiments Onion salt, garlic salt, seasoned salt, table salt, and sea salt. Worcestershire sauce. Tartar sauce. Barbecue sauce. Teriyaki sauce. Soy sauce, including reduced-sodium. Steak sauce. Canned and packaged gravies. Fish sauce. Oyster sauce. Cocktail sauce. Store-bought horseradish. Ketchup. Mustard. Meat flavorings and tenderizers. Bouillon cubes. Hot sauces. Pre-made or packaged marinades. Pre-made or packaged taco seasonings. Relishes. Regular salad dressings. Other foods Salted popcorn and pretzels. The items listed above may not be a complete list of foods and beverages you should avoid. Contact a dietitian for more information. Where to find more information National Heart, Lung, and Blood Institute: https://wilson-eaton.com/ American Heart Association: www.heart.org Academy of Nutrition and Dietetics: www.eatright.Pope: www.kidney.org Summary The DASH eating plan is a healthy eating plan that has been shown to reduce high blood pressure (hypertension). It may also reduce your risk for type 2 diabetes, heart disease, and stroke. When on the DASH eating plan, aim to eat more fresh fruits and vegetables, whole grains, lean proteins, low-fat dairy, and  heart-healthy fats. With the DASH eating plan, you should limit salt (sodium) intake to 2,300 mg a day. If you have hypertension, you may need to reduce your sodium intake to 1,500 mg a day. Work with your health care provider or dietitian to adjust your eating plan to your individual calorie needs. This information is not intended to replace advice given to you by your health care provider. Make sure you discuss any questions you have with your health care provider. Document Revised: 03/16/2019 Document Reviewed: 03/16/2019 Elsevier Patient Education  2022 [employer].
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
## 2021-03-09 ENCOUNTER — Ambulatory Visit: Payer: Self-pay | Admitting: Physician Assistant

## 2021-03-09 ENCOUNTER — Other Ambulatory Visit: Payer: Self-pay

## 2021-03-09 DIAGNOSIS — Z113 Encounter for screening for infections with a predominantly sexual mode of transmission: Secondary | ICD-10-CM

## 2021-03-09 NOTE — Progress Notes (Signed)
Patient here under STD clinic, requesting prescription medication.

## 2021-03-10 ENCOUNTER — Encounter: Payer: Self-pay | Admitting: Physician Assistant

## 2021-03-10 MED ORDER — ACYCLOVIR 400 MG PO TABS: ORAL_TABLET | ORAL | 0 refills | Status: DC

## 2021-03-10 NOTE — Progress Notes (Signed)
S:  Patient into clinic to discuss medicine for HSV.  States that she has been using Valtrex for several years and feels that it is not working as well as it has in the past.  States that she had been taking the medicine as suppressive treatment but after talking with a provider had changed to episodic treatment instead.  Reports that she has occasional outbreaks.  NKDA. O:  WDWN female in NAD, A&O x 3, normal work of breathing. A/P:  1.  Patient with HSV infection and feels that Valtrex is not working as well for her. 2.  Counseled patient that we can try to change to Acyclovir to see if that works better for outbreaks. 3.  Will give Acyclovir 400 mg #240 take 2 pills BID for 5 days as needed for outbreak. 4.  Patient to call back if the Acyclovir seems to work better and needs further prescription. 5.  Rec condoms with all sex. 6.  RTC prn.

## 2021-03-12 ENCOUNTER — Other Ambulatory Visit: Payer: Self-pay

## 2021-03-12 ENCOUNTER — Ambulatory Visit: Payer: Self-pay | Admitting: Licensed Clinical Social Worker

## 2021-03-12 DIAGNOSIS — F411 Generalized anxiety disorder: Secondary | ICD-10-CM

## 2021-03-12 DIAGNOSIS — F331 Major depressive disorder, recurrent, moderate: Secondary | ICD-10-CM

## 2021-03-12 NOTE — BH Specialist Note (Signed)
Integrated Behavioral Health Follow Up In-Person Visit  MRN: [id number] Name: Elaine Robinson   Total time: 60 minutes  Types of Service: Telephone visit Patient consents to telephone visit and 2 patient identifiers were used to identify patient  Interpretor:No. Interpretor Name and Language: N/A  Subjective: Elaine Robinson is a 65 y.o. female accompanied by  herself Patient was referred by Carlyon Shadow, NP for Mental health. Patient reports the following symptoms/concerns: The patient reports decreased mood since her last follow up session. She shared that she frequently cries throughout her day for no apparent reason, has lost interest in all activities she previously enjoyed. She stated she is sleeping well, and gets about 7 hours of sleep a night. The patient explained that she is gaining some weight due to increase in appetite over the last few months. Ms. Dhaliwal discussed family stressor that have impacted her life. She discussed her concerns regarding her daughter and grandchildren's financial well being. Ms. San reported her grandson moved out of her home and is doing better, but not where he should be yet. She noted she spends a great deal of her day worrying about her family, or various things.She explained that she is not leaving her home much and watches TV to distract herself. She shared that she is not practicing self care and has no motivation to get things done anymore. Ms. Reeser stated that she is looking forward to seeing her sister and a family for Thanksgiving. She noted she grew up in [place] and is looking forward to going back home. The patient asked if her medication could be adjusted. She denied any suicidal or homicidal thoughts.   Duration of problem: Years; Severity of problem: moderate  Objective: Mood: Depressed and Affect: Appropriate Risk of harm to self or others: No plan to harm self or others  Life Context: Family and Social: see above School/Work:  see above Self-Care: see above Life Changes: see above  Patient and/or Family's Strengths/Protective Factors: Concrete supports in place (healthy food, safe environments, etc.)  Goals Addressed: Patient will:  Reduce symptoms of: agitation, anxiety, depression, and stress   Increase knowledge and/or ability of: coping skills, healthy habits, self-management skills, and stress reduction   Demonstrate ability to: Increase healthy adjustment to current life circumstances and Increase adequate support systems for patient/family  Progress towards Goals: Ongoing  Interventions: Interventions utilized:  CBT Cognitive Behavioral Therapy was utilized by the clinician during today's follow up session. Clinician met with patient to identify needs related to stressors and functioning, and assess and monitor for signs and symptoms of anxiety and depression, and assess safety. The clinician processed with the patient how they have been doing since the last follow-up session. Clinician intervened with positive regard and optimism to validate client's emotions, and supported client in exploring ways to increase her use of healthy boundaries in her interpersonal relationships. Clinician explored the patient's use of coping skills in response to stressors present in her current life circumstances and encouraged her to incorporate self care into her daily routine. Clinician offered to present the patient's request to psychiatric case consultation meeting on 03/24/2021 at 11:00 am.  Standardized Assessments completed: GAD-7 and PHQ 9 PHQ-9 = 17  Gad- 7=  15  Assessment: Patient currently experiencing see above.   Patient may benefit from see above.  Plan: Follow up with behavioral health clinician on : 03/25/2021 at 9:00 am  Behavioral recommendations:  Referral(s): [place] (In Clinic) "From scale of 1-10, how likely are  you to follow plan?":   Lesli Albee, LCSWA

## 2021-03-17 ENCOUNTER — Ambulatory Visit: Payer: Self-pay | Admitting: Gerontology

## 2021-03-17 ENCOUNTER — Other Ambulatory Visit: Payer: Self-pay

## 2021-03-17 ENCOUNTER — Encounter: Payer: Self-pay | Admitting: Gerontology

## 2021-03-17 VITALS — BP 126/78 | HR 70 | Temp 97.9°F | Resp 16 | Ht 66.0 in | Wt 203.7 lb

## 2021-03-17 DIAGNOSIS — R350 Frequency of micturition: Secondary | ICD-10-CM

## 2021-03-17 DIAGNOSIS — R82998 Other abnormal findings in urine: Secondary | ICD-10-CM

## 2021-03-17 LAB — POCT URINALYSIS DIPSTICK
Bilirubin, UA: NEGATIVE
Glucose, UA: NEGATIVE
Ketones, UA: NEGATIVE
Nitrite, UA: NEGATIVE
Protein, UA: NEGATIVE
Urobilinogen, UA: 1 E.U./dL
pH, UA: 6.5 (ref 5.0–8.0)

## 2021-03-17 MED ORDER — NITROFURANTOIN MONOHYD MACRO 100 MG PO CAPS
100.0000 mg | ORAL_CAPSULE | Freq: Two times a day (BID) | ORAL | 0 refills | Status: DC
  Filled 2021-03-17: qty 10, 5d supply, fill #0
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
## 2021-03-17 NOTE — Patient Instructions (Signed)

## 2021-03-18 ENCOUNTER — Ambulatory Visit: Payer: Self-pay | Admitting: Gerontology

## 2021-03-23 LAB — UA/M W/RFLX CULTURE, ROUTINE
Bilirubin, UA: NEGATIVE
Glucose, UA: NEGATIVE
Ketones, UA: NEGATIVE
Nitrite, UA: NEGATIVE
Protein,UA: NEGATIVE
Specific Gravity, UA: 1.012 (ref 1.005–1.030)
Urobilinogen, Ur: 1 mg/dL (ref 0.2–1.0)
pH, UA: 6.5 (ref 5.0–7.5)

## 2021-03-23 LAB — MICROSCOPIC EXAMINATION
Casts: NONE SEEN /lpf
WBC, UA: >30 /hpf — AB (ref 0–5)

## 2021-03-23 LAB — URINE CULTURE, REFLEX

## 2021-03-24 ENCOUNTER — Other Ambulatory Visit: Payer: Self-pay | Admitting: Gerontology

## 2021-03-24 DIAGNOSIS — R82998 Other abnormal findings in urine: Secondary | ICD-10-CM

## 2021-03-25 ENCOUNTER — Other Ambulatory Visit: Payer: Self-pay

## 2021-03-25 ENCOUNTER — Ambulatory Visit: Payer: Self-pay | Admitting: Licensed Clinical Social Worker

## 2021-03-25 ENCOUNTER — Ambulatory Visit: Payer: Self-pay | Admitting: Gerontology

## 2021-03-25 ENCOUNTER — Encounter: Payer: Self-pay | Admitting: Gerontology

## 2021-03-25 VITALS — BP 119/74 | HR 78 | Temp 97.9°F | Resp 18 | Ht 66.0 in | Wt 204.2 lb

## 2021-03-25 DIAGNOSIS — N390 Urinary tract infection, site not specified: Secondary | ICD-10-CM

## 2021-03-25 LAB — POCT URINALYSIS DIPSTICK
Bilirubin, UA: NEGATIVE
Glucose, UA: NEGATIVE
Nitrite, UA: NEGATIVE
Protein, UA: POSITIVE — AB
Spec Grav, UA: 1.025 (ref 1.010–1.025)
Urobilinogen, UA: 2 E.U./dL — AB
pH, UA: 6 (ref 5.0–8.0)
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
## 2021-03-26 ENCOUNTER — Other Ambulatory Visit: Payer: Self-pay

## 2021-03-26 ENCOUNTER — Other Ambulatory Visit: Payer: Self-pay | Admitting: Gerontology

## 2021-03-26 DIAGNOSIS — R82998 Other abnormal findings in urine: Secondary | ICD-10-CM

## 2021-03-26 MED ORDER — CEPHALEXIN 500 MG PO CAPS
500.0000 mg | ORAL_CAPSULE | Freq: Four times a day (QID) | ORAL | 0 refills | Status: DC
  Filled 2021-03-26: qty 28, 7d supply, fill #0

## 2021-03-27 LAB — UA/M W/RFLX CULTURE, ROUTINE

## 2021-04-08 ENCOUNTER — Ambulatory Visit: Payer: Self-pay | Admitting: Gerontology

## 2021-04-16 ENCOUNTER — Other Ambulatory Visit: Payer: Self-pay

## 2021-04-16 ENCOUNTER — Other Ambulatory Visit: Payer: Self-pay | Admitting: Gerontology

## 2021-04-16 DIAGNOSIS — F411 Generalized anxiety disorder: Secondary | ICD-10-CM

## 2021-04-16 MED FILL — Sertraline HCl Tab 100 MG: ORAL | 30 days supply | Qty: 30 | Fill #0 | Status: AC

## 2021-05-06 NOTE — Progress Notes (Signed)
Letter mailed from [facility] to notify of normal mammogram results.  Patient to return in one year for annual screening.  Copy to [facility]. 

## 2021-05-18 ENCOUNTER — Other Ambulatory Visit: Payer: Self-pay

## 2021-05-18 ENCOUNTER — Other Ambulatory Visit: Payer: Self-pay | Admitting: Gerontology

## 2021-05-18 DIAGNOSIS — Z8619 Personal history of other infectious and parasitic diseases: Secondary | ICD-10-CM

## 2021-05-18 DIAGNOSIS — F411 Generalized anxiety disorder: Secondary | ICD-10-CM

## 2021-05-19 ENCOUNTER — Other Ambulatory Visit: Payer: Self-pay

## 2021-05-19 MED ORDER — SERTRALINE HCL 100 MG PO TABS
ORAL_TABLET | Freq: Every day | ORAL | 0 refills | Status: DC
  Filled 2021-05-19: qty 30, 30d supply, fill #0

## 2021-05-19 MED FILL — Valacyclovir HCl Tab 500 MG: ORAL | 30 days supply | Qty: 60 | Fill #0 | Status: AC

## 2021-05-20 ENCOUNTER — Ambulatory Visit: Payer: Self-pay | Admitting: Licensed Clinical Social Worker

## 2021-05-20 ENCOUNTER — Other Ambulatory Visit: Payer: Self-pay

## 2021-05-20 ENCOUNTER — Other Ambulatory Visit: Payer: Self-pay | Admitting: Gerontology

## 2021-05-20 DIAGNOSIS — F331 Major depressive disorder, recurrent, moderate: Secondary | ICD-10-CM

## 2021-05-20 DIAGNOSIS — F411 Generalized anxiety disorder: Secondary | ICD-10-CM

## 2021-05-20 NOTE — BH Specialist Note (Signed)
Integrated Behavioral Health Follow Up In-Person Visit  MRN: [id number] Name: Elaine Robinson  Total time: 45  minutes  Types of Service: Individual psychotherapy  Interpretor:No. Interpretor Name and Language: N/A  Subjective: Elaine Robinson is a 66 y.o. female accompanied by  herself Patient was referred by Elaine Shadow, NP for mental health. Patient reports the following symptoms/concerns: The patient reports that she has been doing worse since her last follow-up appointment. She discussed family stressors impacting her life currently. Elaine Robinson shared that she has been traveling back and forth to [place], [place] to care for her older sister who has recently endured her fourth hip surgery. The patient explained that she is also caring for her brother who has Schizophrenia and lives out of town as well. She noted that she begins her day very early and does not get home until well after 10 or 11 PM nightly. Elaine Robinson noted that she is struggling with increased fatigue,crying spells throughout her day, agitation, feelings of guilt and shame and excessive uncontrollable worrying daily. She shared her mother was a Research officer, trade union as well and feels this is common in her family. The patient noted that she relies on her faith and praying to get through difficult moments in her life. She explained that she has a difficult time saying no to requests that are made of her and feels responsible to care for others before herself. She stated she has not had time for self care and agrees that is an area she needs to focus on. The patient stated she called to have her sertraline refilled, but was told she had to go to her therapist appointment first. Elaine Robinson shared that she was not trying to miss any appointments, but was struggling to have her car repaired on top of her other problems. Elaine Robinson denied any suicidal or homicidal thoughts. Duration of problem: Years; Severity of problem:  moderate  Objective: Mood: Depressed and Affect: Appropriate Risk of harm to self or others: No plan to harm self or others  Life Context: Family and Social: see above School/Work: see above Self-Care: see above Life Changes: see above  Patient and/or Family's Strengths/Protective Factors: Social connections, Concrete supports in place (healthy food, safe environments, etc.), and Sense of purpose  Goals Addressed: Patient will:  Reduce symptoms of: agitation, anxiety, depression, and stress   Increase knowledge and/or ability of: coping skills, healthy habits, self-management skills, and stress reduction   Demonstrate ability to: Increase healthy adjustment to current life circumstances and Increase adequate support systems for patient/family  Progress towards Goals: Ongoing  Interventions: Interventions utilized:  CBT Cognitive Behavioral Therapywas utilized by the clinician during today's follow up session. Clinician met with patient to identify needs related to stressors and functioning, and assess and monitor for signs and symptoms of anxiety and depression, and assess safety. The clinician processed with the patient how they have been doing since the last follow-up session. Clinician measured the patient's anxiety and depression on numerical scale. Clinician intervened with positive regard and optimism to validate client's emotions, and supported client in exploring ways to set healthy boundaries and focus on what is in her control verses what is not. Clinician introduced distress tolerance skills to the patient and explained that negative emotions will usually lessen in intensity and pass over time. Clinician encouraged the patient to take their mediation at the same time everyday exactly as prescribed for it to reach it's full intended effect. Clinician offered to message the patient's primary care provider  with her request for a refill on sertraline 100 MG. The session ended with  scheduling.    Standardized Assessments completed: GAD-7 and PHQ 9 PHQ-9=  16 GAD-7 = 19  Assessment: Patient currently experiencing see above.   Patient may benefit from see above.   Plan: Follow up with behavioral health clinician on : 06/03/2021 at 2:00 PM Behavioral recommendations: Referral(s): Elaine Robinson (In Clinic) "From scale of 1-10, how likely are you to follow plan?":  Elaine Robinson, LCSWA

## 2021-05-20 NOTE — BH Specialist Note (Deleted)
Integrated Behavioral Health via Telemedicine Visit  05/20/2021 Elaine Robinson [id number]   Total time: {IBH Total SEGB:[id number]}  Referring Provider: *** Patient/Family location: *** [facility] Provider location: *** All persons participating in visit: *** Types of Service: {CHL AMB TYPE OF [phone]}  I connected with Elaine Robinson and/or Elaine Robinson's {family members:20773} via  Telephone or Video Enabled Telemedicine Application  (Video is Caregility application) and verified that I am speaking with the correct person using two identifiers. Discussed confidentiality: {YES/NO:21197}  I discussed the limitations of telemedicine and the availability of in person appointments.  Discussed there is a possibility of technology failure and discussed alternative modes of communication if that failure occurs.   Patient and/or legal guardian expressed understanding and consented to Telemedicine visit: Yes   Presenting Concerns: Patient and/or family reports the following symptoms/concerns: The patient reports that she has been doing worse since her last follow-up appointment.  Duration of problem: Years; Severity of problem: moderate  Patient and/or Family's Strengths/Protective Factors: Concrete supports in place (healthy food, safe environments, etc.)  Goals Addressed: Patient will:  Reduce symptoms of: agitation, anxiety, depression, insomnia, and stress   Increase knowledge and/or ability of: coping skills, healthy habits, self-management skills, and stress reduction   Demonstrate ability to: Increase healthy adjustment to current life circumstances  Progress towards Goals: Ongoing  Interventions: Interventions utilized:  CBT Cognitive Behavioral Therapy Standardized Assessments completed: GAD-7 and PHQ 9    Assessment: Patient currently experiencing ***.   Patient may benefit from ***.  Plan: Follow up with behavioral health clinician on : *** Behavioral  recommendations: *** Referral(s): [place] (In Clinic)  I discussed the assessment and treatment plan with the patient and/or parent/guardian. They were provided an opportunity to ask questions and all were answered. They agreed with the plan and demonstrated an understanding of the instructions.   They were advised to call back or seek an in-person evaluation if the symptoms worsen or if the condition fails to improve as anticipated.  Lesli Albee, LCSWA

## 2021-06-03 ENCOUNTER — Ambulatory Visit: Payer: Self-pay | Admitting: Licensed Clinical Social Worker

## 2021-06-09 ENCOUNTER — Other Ambulatory Visit: Payer: Self-pay

## 2021-06-09 ENCOUNTER — Ambulatory Visit: Payer: Self-pay | Admitting: Licensed Clinical Social Worker

## 2021-06-09 DIAGNOSIS — F331 Major depressive disorder, recurrent, moderate: Secondary | ICD-10-CM

## 2021-06-09 DIAGNOSIS — F411 Generalized anxiety disorder: Secondary | ICD-10-CM

## 2021-06-15 ENCOUNTER — Other Ambulatory Visit: Payer: Self-pay | Admitting: Gerontology

## 2021-06-15 ENCOUNTER — Other Ambulatory Visit: Payer: Self-pay

## 2021-06-15 DIAGNOSIS — F411 Generalized anxiety disorder: Secondary | ICD-10-CM

## 2021-06-16 ENCOUNTER — Other Ambulatory Visit: Payer: Self-pay

## 2021-06-16 MED ORDER — SERTRALINE HCL 100 MG PO TABS
ORAL_TABLET | Freq: Every day | ORAL | 0 refills | Status: DC
  Filled 2021-06-16: qty 30, 30d supply, fill #0

## 2021-06-16 MED FILL — Valacyclovir HCl Tab 500 MG: ORAL | 6 days supply | Qty: 12 | Fill #1 | Status: AC

## 2021-06-24 ENCOUNTER — Ambulatory Visit: Payer: Self-pay | Admitting: Licensed Clinical Social Worker

## 2021-06-26 ENCOUNTER — Other Ambulatory Visit: Payer: Self-pay | Admitting: *Deleted

## 2021-06-26 DIAGNOSIS — Z87891 Personal history of nicotine dependence: Secondary | ICD-10-CM

## 2021-07-01 ENCOUNTER — Ambulatory Visit: Payer: Self-pay | Admitting: Licensed Clinical Social Worker

## 2021-07-07 ENCOUNTER — Other Ambulatory Visit: Payer: Self-pay

## 2021-07-08 ENCOUNTER — Other Ambulatory Visit: Payer: Self-pay

## 2021-07-08 ENCOUNTER — Ambulatory Visit: Payer: Self-pay | Admitting: Licensed Clinical Social Worker

## 2021-07-08 DIAGNOSIS — F411 Generalized anxiety disorder: Secondary | ICD-10-CM

## 2021-07-08 DIAGNOSIS — F331 Major depressive disorder, recurrent, moderate: Secondary | ICD-10-CM

## 2021-07-08 NOTE — BH Specialist Note (Signed)
Integrated Behavioral Health via Telemedicine Visit ? ?07/08/2021 ?Elaine Robinson ?[id number] ? ?Number of [place] Clinician visits: No data recorded ?Session Start time: No data recorded  ?Session End time: No data recorded ?Total time in minutes: No data recorded ? ?Referring Provider: Carlyon Shadow, NP  ?Patient/Family location: The patient is at her daughter's home. ?[facility] Provider location: [facility] [place] ?All persons participating in visit: [name] and Jerrilyn Cairo, MSW, LCSW-A ?Types of Service: Telephone visit ? ?I connected with [name] via  Telephone or Video Enabled Telemedicine Application  (Video is Caregility application) and verified that I am speaking with the correct person using two identifiers. Discussed confidentiality: Yes  ? ?I discussed the limitations of telemedicine and the availability of in person appointments.  Discussed there is a possibility of technology failure and discussed alternative modes of communication if that failure occurs. ? ?Patient and/or legal guardian expressed understanding and consented to Telemedicine visit: Yes  ? ?Presenting Concerns: ?Patient and/or family reports the following symptoms/concerns: The patient reports that she has been doing okay since her last follow-up appointment. She stated that she cries frequently throughout her day for no apparent reason. She shared that she woke up and cried for 20 minutes this morning before she left to go to her daughter's home to babysit her 11 month old granddaughter. Ms. Steffy noted that the best part of her day is babysitting her granddaughter and she looks forward to spending time with her. She discussed familial stressors that are impacting her life, and noted that some weeks are better than others. Ms. Quijas noted that she feels down and depressed, feels like she is letting other people down and struggles with a lack of concentration daily. The patient  denied any suicidal or homicidal thoughts.  ?Duration of problem: Years ; Severity of problem: moderate ? ?Patient and/or Family's Strengths/Protective Factors: ?Social connections, Concrete supports in place (healthy food, safe environments, etc.), Sense of purpose, and Physical Health (exercise, healthy diet, medication compliance, etc.) ? ?Goals Addressed: ?Patient will: ? Reduce symptoms of: agitation, anxiety, depression, and stress  ? Increase knowledge and/or ability of: coping skills, healthy habits, self-management skills, and stress reduction  ? Demonstrate ability to: Increase healthy adjustment to current life circumstances and Increase adequate support systems for patient/family ? ?Progress towards Goals: ?Ongoing ? ?Interventions: ?Interventions utilized:  CBT Cognitive Behavioral Therapywas utilized by the clinician during today's follow up session. Clinician met with patient to identify needs related to stressors and functioning, and assess and monitor for signs and symptoms of anxiety and depression, and assess safety. The clinician processed with the patient how they have been doing since the last follow-up session. Clinician measured the patient's anxiety and depression on a numerical scale. The Clinician accessed the patient's use of previously taught coping strategies to reduce anxiety and determined the patient has intermittently implemented behavioral coping strategies; Clinician reminded the patient about these helpful techniques and encouraged her to practice these skills daily, even during times when she did not feel distressed. Clinician recommended to take the patient's case for consultation with Dr. Octavia Heir, MD, Psychiatric Consultant, and Carlyon Shadow, NP, on Thursday 07/16/2021 at 10:00 AM. The session ended with scheduling.   ?Standardized Assessments completed: GAD-7 and PHQ 9 ?GAD-7=   16 ?PHQ-9=   13 ? ?Assessment: ?Patient currently experiencing see above.  ? ?Patient may  benefit from see above. ? ?Plan: ?Follow up with behavioral health clinician on : 07/16/2021 at 2:00 PM  ?  Behavioral recommendations:  ?Referral(s): [place] (In Clinic) ? ?I discussed the assessment and treatment plan with the patient and/or parent/guardian. They were provided an opportunity to ask questions and all were answered. They agreed with the plan and demonstrated an understanding of the instructions. ?  ?They were advised to call back or seek an in-person evaluation if the symptoms worsen or if the condition fails to improve as anticipated. ? ?Lesli Albee, LCSWA ?

## 2021-07-14 ENCOUNTER — Other Ambulatory Visit: Payer: Self-pay | Admitting: Gerontology

## 2021-07-14 ENCOUNTER — Other Ambulatory Visit: Payer: Self-pay

## 2021-07-14 ENCOUNTER — Ambulatory Visit
Admission: RE | Admit: 2021-07-14 | Discharge: 2021-07-14 | Disposition: A | Discharge status: Home / Self Care | Payer: Medicaid Other | Source: Ambulatory Visit | Attending: Acute Care | Admitting: Acute Care

## 2021-07-14 DIAGNOSIS — F411 Generalized anxiety disorder: Secondary | ICD-10-CM

## 2021-07-14 DIAGNOSIS — Z87891 Personal history of nicotine dependence: Secondary | ICD-10-CM | POA: Diagnosis present

## 2021-07-14 MED ORDER — SERTRALINE HCL 100 MG PO TABS
ORAL_TABLET | Freq: Every day | ORAL | 0 refills | Status: DC
  Filled 2021-07-14: qty 30, 30d supply, fill #0

## 2021-07-14 MED FILL — Valacyclovir HCl Tab 500 MG: ORAL | 30 days supply | Qty: 60 | Fill #2 | Status: AC

## 2021-07-16 ENCOUNTER — Other Ambulatory Visit: Payer: Self-pay

## 2021-07-16 ENCOUNTER — Ambulatory Visit: Payer: Self-pay | Admitting: Licensed Clinical Social Worker

## 2021-07-16 ENCOUNTER — Other Ambulatory Visit: Payer: Self-pay | Admitting: Gerontology

## 2021-07-16 DIAGNOSIS — F411 Generalized anxiety disorder: Secondary | ICD-10-CM

## 2021-07-16 DIAGNOSIS — Z87891 Personal history of nicotine dependence: Secondary | ICD-10-CM

## 2021-07-16 DIAGNOSIS — F331 Major depressive disorder, recurrent, moderate: Secondary | ICD-10-CM

## 2021-07-16 MED ORDER — SERTRALINE HCL 100 MG PO TABS
ORAL_TABLET | Freq: Every day | ORAL | 0 refills | Status: DC
  Filled 2021-07-16: qty 30, fill #0

## 2021-07-16 MED ORDER — SERTRALINE HCL 50 MG PO TABS
50.0000 mg | ORAL_TABLET | Freq: Every day | ORAL | 0 refills | Status: DC
  Filled 2021-07-16: qty 30, 30d supply, fill #0

## 2021-07-16 NOTE — BH Specialist Note (Signed)
Integrated Behavioral Health via Telemedicine Visit ? ?07/16/2021 ?Elaine Robinson ?[id number] ? ?Number of [place] Clinician visits: No data recorded ?Session Start time: No data recorded  ?Session End time: No data recorded ?Total time in minutes: No data recorded ? ?Referring Provider: Carlyon Shadow, NP  ?Patient/Family location: The patient's home ?[facility] Provider location: [facility] [place] ?All persons participating in visit: Elaine Robinson and Elaine Cairo, LCSW-A ?Types of Service: Telephone visit ? ?I connected with Elaine Robinson or Video Enabled Telemedicine Application  (Video is Caregility application) and verified that I am speaking with the correct person using two identifiers. Discussed confidentiality: Yes  ? ?I discussed the limitations of telemedicine and the availability of in person appointments.  Discussed there is a possibility of technology failure and discussed alternative modes of communication if that failure occurs. ? ?Patient and/or legal guardian expressed understanding and consented to Telemedicine visit: Yes  ? ?Presenting Concerns: ?Patient and/or family reports the following symptoms/concerns: The patient reports that she has been doing about the same since her last follow-up appointment. She noted that she has plans with her sister to go visit their brother in [place], [place]. She shared that her brother has paranoid schizophrenia and she feels responsible to ensure he is well looked after. Elaine Robinson noted her mood has remained the same and she continues to cry frequently throughout her day. She noted that she relies on her faith and God to see her through difficult moments in her life. Elaine Robinson stated that she understood that the psychiatric consultant and her primary care provider recommended that she increase her dosage of sertraline (Zoloft) from 100 MG to 150 MG daily. She noted that she would pick up her prescription from  [facility] and would call or ask her pharmacist if she had any further questions regarding her medication. The patient denied any suicidal or homicidal thoughts.  ?Duration of problem: Years ; Severity of problem: moderate ? ?Patient and/or Family's Strengths/Protective Factors: ? ? ?Goals Addressed: ?Patient will: ? Reduce symptoms of: agitation, anxiety, insomnia, and stress  ? Increase knowledge and/or ability of: coping skills, healthy habits, self-management skills, and stress reduction  ? Demonstrate ability to: Increase healthy adjustment to current life circumstances and Increase adequate support systems for patient/family ? ?Progress towards Goals: ?Ongoing ? ?Interventions: ?Interventions utilized:  CBT Cognitive Behavioral Therapywas utilized by the clinician during today's follow up session. Clinician met with patient to identify needs related to stressors and functioning, and assess and monitor for signs and symptoms of anxiety and depression, and assess safety. The clinician processed with the patient how they have been doing since the last follow-up session. Clinician utilized guidance and supervision, provided by psychiatric consultant, to inform patient of the benefits and risks of psychotropic medication use including a verbal summary of Black Box warnings. Patient was given the opportunity to ask questions and address concerns regarding the psychiatric consultants recommendations for treatment during today's session. The clinician encouraged the patient to utilize their coping skills and incorporate self care into her daily routine to help her deal with her current life circumstances. The session ended with scheduling. ?Standardized Assessments completed:  GAD-7 and PHQ-9 were completed on 07/08/2021    ? ?Assessment: ?Patient currently experiencing see above.  ? ?Patient may benefit from see above. ? ?Plan: ?Follow up with behavioral health clinician on : 07/23/2021 at 11:00 am   ?Behavioral recommendations:  ?Referral(s): [place] (In Clinic) ? ?I discussed the assessment  and treatment plan with the patient and/or parent/guardian. They were provided an opportunity to ask questions and all were answered. They agreed with the plan and demonstrated an understanding of the instructions. ?  ?They were advised to call back or seek an in-person evaluation if the symptoms worsen or if the condition fails to improve as anticipated. ? ?Lesli Albee, LCSWA ?

## 2021-07-23 ENCOUNTER — Ambulatory Visit: Payer: Self-pay | Admitting: Licensed Clinical Social Worker

## 2021-07-25 ENCOUNTER — Emergency Department
Admission: EM | Admit: 2021-07-25 | Discharge: 2021-07-25 | Disposition: A | Discharge status: Home / Self Care | Payer: Medicaid Other | Attending: Emergency Medicine | Admitting: Emergency Medicine

## 2021-07-25 ENCOUNTER — Other Ambulatory Visit: Payer: Self-pay

## 2021-07-25 ENCOUNTER — Encounter: Payer: Self-pay | Admitting: Emergency Medicine

## 2021-07-25 DIAGNOSIS — R300 Dysuria: Secondary | ICD-10-CM | POA: Diagnosis present

## 2021-07-25 DIAGNOSIS — I10 Essential (primary) hypertension: Secondary | ICD-10-CM | POA: Diagnosis not present

## 2021-07-25 DIAGNOSIS — N3090 Cystitis, unspecified without hematuria: Secondary | ICD-10-CM | POA: Diagnosis not present

## 2021-07-25 LAB — URINALYSIS, ROUTINE W REFLEX MICROSCOPIC
Bilirubin Urine: NEGATIVE
Glucose, UA: NEGATIVE mg/dL
Ketones, ur: NEGATIVE mg/dL
Nitrite: NEGATIVE
Protein, ur: 30 mg/dL — AB
RBC / HPF: >50 RBC/hpf — ABNORMAL HIGH (ref 0–5)
Specific Gravity, Urine: 1.016 (ref 1.005–1.030)
WBC, UA: >50 WBC/hpf — ABNORMAL HIGH (ref 0–5)
pH: 5 (ref 5.0–8.0)

## 2021-07-25 MED ORDER — CEPHALEXIN 500 MG PO CAPS
1000.0000 mg | ORAL_CAPSULE | Freq: Once | ORAL | Status: AC
  Administered 2021-07-25: 1000 mg via ORAL
  Filled 2021-07-25: qty 2

## 2021-07-25 MED ORDER — CEPHALEXIN 500 MG PO CAPS
500.0000 mg | ORAL_CAPSULE | Freq: Two times a day (BID) | ORAL | 0 refills | Status: AC
  Filled 2021-07-25: qty 14, 7d supply, fill #0

## 2021-07-25 NOTE — ED Provider Notes (Signed)
? ?[facility] ?Provider Note ? ? ? Event Date/Time  ? First MD Initiated Contact with Patient 07/25/21 [phone]   ?  (approximate) ? ? ?History  ? ?Chief Complaint ?Dysuria and Urinary Frequency ? ? ?HPI ?Elaine Robinson is a 66 y.o. female, history of hypertension, hyperlipidemia, prediabetes, arthritis, depression, presents the emergency department for evaluation of dysuria.  She states that she has been experiencing urgency, frequency, and dysuria for the past 3 days.  Reports having a dark color in her urine, as well as painful urination.  She tried to go to an outpatient clinic yesterday, however they were closed.  She states that she is had several urinary tract infections in the past, and this feels similar.  She states that she has never had any kidney stones or kidney infections.  Denies fever/chills, chest pain, shortness of breath, abdominal pain, flank pain, nausea/vomiting, diarrhea, rashes, headache, or lightheadedness/dizziness. ? ?History Limitations: No limitations. ? ?  ? ? ?Physical Exam  ?Triage Vital Signs: ?ED Triage Vitals  ?Enc Vitals Group  ?   BP 07/25/21 0757 117/78  ?   Pulse Rate 07/25/21 0757 88  ?   Resp 07/25/21 0757 18  ?   Temp 07/25/21 0757 98.6 ?F (37 ?C)  ?   Temp Source 07/25/21 0757 Oral  ?   SpO2 07/25/21 0757 95 %  ?   Weight 07/25/21 0754 200 lb 9.9 oz (91 kg)  ?   Height 07/25/21 0754 '5\' 6"'$  (1.676 m)  ?   Head Circumference --   ?   Peak Flow --   ?   Pain Score 07/25/21 0754 0  ?   Pain Loc --   ?   Pain Edu? --   ?   Excl. in [place]? --   ? ? ?Most recent vital signs: ?Vitals:  ? 07/25/21 0757  ?BP: 117/78  ?Pulse: 88  ?Resp: 18  ?Temp: 98.6 ?F (37 ?C)  ?SpO2: 95%  ? ? ?General: Awake, NAD.  ?Skin: Warm, dry.  ?CV: Good peripheral perfusion.  ?Resp: Normal effort.  ?Abd: Soft, non-tender. No distention.  ?Neuro: At baseline. No gross neurological deficits.  ?Other: No CVA tenderness. ? ?Physical Exam ? ? ? ?ED Results / Procedures / Treatments  ?Labs ?(all  labs ordered are listed, but only abnormal results are displayed) ?Labs Reviewed  ?URINALYSIS, ROUTINE W REFLEX MICROSCOPIC - Abnormal; Notable for the following components:  ?    Result Value  ? Color, Urine YELLOW (*)   ? APPearance CLOUDY (*)   ? Hgb urine dipstick LARGE (*)   ? Protein, ur 30 (*)   ? Leukocytes,Ua LARGE (*)   ? RBC / HPF >50 (*)   ? WBC, UA >50 (*)   ? Bacteria, UA FEW (*)   ? All other components within normal limits  ? ? ? ?EKG ?Not applicable ? ? ?RADIOLOGY ? ?ED Provider Interpretation: Not applicable. ? ?No results found. ? ?PROCEDURES: ? ?Critical Care performed: Not applicable. ? ?Procedures ? ? ? ?MEDICATIONS ORDERED IN ED: ?Medications  ?cephALEXin (KEFLEX) capsule 1,000 mg (1,000 mg Oral Given 07/25/21 0833)  ? ? ? ?IMPRESSION / MDM / ASSESSMENT AND PLAN / ED COURSE  ?I reviewed the triage vital signs and the nursing notes. ?             ?               ? ? ?Differential diagnosis includes, but is not  limited to, cystitis, pyelonephritis, nephrolithiasis, STI ? ? ?ED Course ?Patient appears well.  Vital signs within normal limits.  NAD.  Not currently in any active pain at this time. ? ?Urinalysis notable for large leukocytes and few bacteria, as well as large hemoglobin, consistent with urinary tract infection. ? ?Assessment/Plan ?Presentation consistent with cystitis.  We will go ahead and treat here with cephalexin, as patient is unable to get her prescribed medications today.  History and physical exam not concerning for nephrolithiasis or pyelonephritis.  Encouraged her to follow-up with her primary care provider as needed.  We will plan to discharge. ? ?Patient was provided with anticipatory guidance, return precautions, and educational material. Encouraged the patient to return to the emergency department at any time if they begin to experience any new or worsening symptoms.  ? ?  ? ? ?FINAL CLINICAL IMPRESSION(S) / ED DIAGNOSES  ? ?Final diagnoses:  ?Cystitis  ? ? ? ?Rx / DC  Orders  ? ?ED Discharge Orders   ? ?      Ordered  ?  cephALEXin (KEFLEX) 500 MG capsule  2 times daily       ? 07/25/21 0829  ? ?  ?  ? ?  ? ? ? ?Note:  This document was prepared using Dragon voice recognition software and may include unintentional dictation errors. ?  ?Teodoro Spray, [place] ?07/25/21 9509 ? ?  ?Vanessa [place], MD ?07/25/21 [phone] ? ?

## 2021-07-25 NOTE — ED Triage Notes (Signed)
Pt reports has a urinary tract infection. Pt reports urinary urgency, frequency and dysuria. Pt states multiple UTI's in the past and knows that is what it is.  ?

## 2021-07-25 NOTE — ED Notes (Signed)
Painful urination. Blood in urine. Been ongoing for a few days.  ?

## 2021-07-25 NOTE — Discharge Instructions (Addendum)
-  Take all of your antibiotics as prescribed. ?-Return to the emergency department anytime if you begin to experience any new or worsening symptoms. ?-Follow-up with your primary care provider as needed. ?

## 2021-07-27 ENCOUNTER — Other Ambulatory Visit: Payer: Self-pay

## 2021-08-03 ENCOUNTER — Telehealth: Payer: Self-pay | Admitting: Pharmacy Technician

## 2021-08-03 ENCOUNTER — Other Ambulatory Visit: Payer: Self-pay

## 2021-08-03 NOTE — Telephone Encounter (Signed)
Patient has Medicaid with prescription drug coverage.  No longer meets [facility]'s eligibility criteria.  Pt notified by letter. ? ?[name] ?Care Manager ?[facility] ? ? ?[address] ?   [place], [place]  [address] ? ?August 03, 2021 ? ? ? ?Dear Elaine Robinson, ? ?This is to inform you that you are no longer eligible to receive medication assistance at [facility].  The reason(s) are:   ? ?_____Your total gross monthly household income exceeds 300% of the Federal Poverty Level.   ?_____Tangible assets (savings, checking, stocks/bonds, pension, retirement, etc.) exceeds our limit  ?_____You are eligible to receive benefits from  [facility] or HIV Medication  ?          Assistance program ?_____You are eligible to receive benefits from a Medicare Part ?D? plan ?__X__You have prescription insurance with Medicaid ?_____You are not an [facility] resident ?_____Failure to provide all requested documentation (proof of income information for 2023, and/or Patient Intake Application, DOH Attestation, Contract, etc).   ? ?We regret that we are unable to help you at this time.  If your prescription coverage is terminated, please contact [facility], so that we may reassess your eligibility for our program.  If you have questions, we may be contacted at [phone]. ? ?Thank you, ? ?[facility]  ?

## 2021-08-05 ENCOUNTER — Ambulatory Visit: Payer: Self-pay | Admitting: Licensed Clinical Social Worker

## 2021-08-05 DIAGNOSIS — F331 Major depressive disorder, recurrent, moderate: Secondary | ICD-10-CM

## 2021-08-05 DIAGNOSIS — F411 Generalized anxiety disorder: Secondary | ICD-10-CM

## 2021-08-05 NOTE — BH Specialist Note (Signed)
Integrated Behavioral Health via Telemedicine Visit ? ?08/05/2021 ?Elaine Robinson ?[id number] ? ? ? ?Referring Provider: Carlyon Shadow, NP  ?Patient/Family location: The patient's home ?[facility] Provider location: [facility] ?All persons participating in visit: Elaine Robinson and Elaine Cairo, LCSW-A ?Types of Service: Telephone visit ? ?I connected with Elaine Robinson via Telephone or Video Enabled Telemedicine Application  (Video is Caregility application) and verified that I am speaking with the correct person using two identifiers. Discussed confidentiality: Yes  ? ?I discussed the limitations of telemedicine and the availability of in person appointments.  Discussed there is a possibility of technology failure and discussed alternative modes of communication if that failure occurs. ? ?Patient and/or legal guardian expressed understanding and consented to Telemedicine visit: Yes  ? ?Presenting Concerns: ?Patient and/or family reports the following symptoms/concerns: The patient reports that she has been doing  about the same. She shared that she continues to have about three bad days a week. She explained that on her bad days she cries frequently, feels depressed, and looses interest in activities she enjoys. The patient discussed other health and Financial stressors impacting her life currently. She shared that she will miss the staff ant the [facility] and appreciates the care she received. The patient denied any suicidal or homicidal thoughts. ?Duration of problem: Years; Severity of problem: moderate ? ?Patient and/or Family's Strengths/Protective Factors: ?Social connections, Concrete supports in place (healthy food, safe environments, etc.), Sense of purpose, and Physical Health (exercise, healthy diet, medication compliance, etc.) ? ?Goals Addressed: ?Patient will: ? Reduce symptoms of: agitation, anxiety, depression, insomnia, and stress  ? Increase knowledge and/or ability  of: coping skills, healthy habits, self-management skills, and stress reduction  ? Demonstrate ability to: Increase healthy adjustment to current life circumstances ? ?Progress towards Goals: ?Other ? ?Interventions: ?Interventions utilized:  Supportive Counselingwas utilized by the clinician during today's follow up session. Clinician met with the patient to identify needs related to stressors and functioning, assess and monitor for signs and symptoms of anxiety and depression, and assess safety. The clinician processed with the patient how they had been doing since the last follow-up session. Clinician provided a safe, judgment-free space for the patient to vent her frustrations regarding her current life circumstances. The clinician provided referrals to outside mental health agencies that accept Medicaid. Clinician processed with the patient how she felt about transitioning to another mental health provider. The clinician answered the patient's questions regarding transitioning care, provided her with [facility] contact information, and encouraged her to reach out should she experience difficulties establishing care with a new provider.  ?Standardized Assessments completed: GAD-7 and PHQ 9 ?GAD-7=16 ?PHQ-9= 07 ? ?Assessment: ?Patient currently experiencing see above.  ? ?Patient may benefit from see above. ? ?Plan: ?Follow up with behavioral health clinician on : The patient has medicaid was referred to a Medicaid provider.  ?Behavioral recommendations:  ?Referral(s): Armed forces logistics/support/administrative officer (LME/Outside Clinic) [facility], [facility], and [facility] ? ?I discussed the assessment and treatment plan with the patient and/or parent/guardian. They were provided an opportunity to ask questions and all were answered. They agreed with the plan and demonstrated an understanding of the instructions. ?  ?They were advised to call back or seek an in-person evaluation if the symptoms worsen or if the  condition fails to improve as anticipated. ? ?Lesli Albee, LCSWA ?

## 2021-08-12 ENCOUNTER — Other Ambulatory Visit: Payer: Self-pay

## 2021-08-17 ENCOUNTER — Other Ambulatory Visit: Payer: Self-pay

## 2021-08-19 ENCOUNTER — Ambulatory Visit: Payer: Self-pay | Admitting: Gerontology

## 2021-08-19 ENCOUNTER — Other Ambulatory Visit: Payer: Self-pay | Admitting: Gerontology

## 2021-08-19 ENCOUNTER — Other Ambulatory Visit: Payer: Self-pay

## 2021-08-19 DIAGNOSIS — I10 Essential (primary) hypertension: Secondary | ICD-10-CM

## 2021-08-19 DIAGNOSIS — F411 Generalized anxiety disorder: Secondary | ICD-10-CM

## 2021-08-19 DIAGNOSIS — Z8619 Personal history of other infectious and parasitic diseases: Secondary | ICD-10-CM

## 2021-08-19 DIAGNOSIS — E785 Hyperlipidemia, unspecified: Secondary | ICD-10-CM

## 2021-08-19 MED ORDER — SERTRALINE HCL 50 MG PO TABS
50.0000 mg | ORAL_TABLET | Freq: Every day | ORAL | 0 refills | Status: DC
  Filled 2021-08-19: qty 90, 30d supply, fill #0

## 2021-08-19 MED ORDER — SERTRALINE HCL 100 MG PO TABS
ORAL_TABLET | Freq: Every day | ORAL | 0 refills | Status: DC
  Filled 2021-08-19: qty 30, fill #0

## 2021-08-19 MED ORDER — AMLODIPINE BESYLATE 5 MG PO TABS
ORAL_TABLET | Freq: Every day | ORAL | 1 refills | Status: DC
  Filled 2021-08-19: qty 90, fill #0

## 2021-08-19 MED ORDER — ASPIRIN 81 MG PO CHEW
CHEWABLE_TABLET | ORAL | 2 refills | Status: AC
  Filled 2021-08-19: qty 90, fill #0

## 2021-08-19 MED ORDER — VALACYCLOVIR HCL 500 MG PO TABS
ORAL_TABLET | ORAL | 0 refills | Status: DC
  Filled 2021-08-19: qty 60, 30d supply, fill #0

## 2021-08-19 MED ORDER — SIMVASTATIN 10 MG PO TABS
ORAL_TABLET | Freq: Every evening | ORAL | 0 refills | Status: DC
  Filled 2021-08-19: qty 30, 30d supply, fill #0

## 2021-08-19 MED ORDER — VALACYCLOVIR HCL 500 MG PO TABS
ORAL_TABLET | ORAL | 2 refills | Status: DC
  Filled 2021-08-19: qty 60, fill #0

## 2021-08-19 MED ORDER — AMLODIPINE BESYLATE 10 MG PO TABS
ORAL_TABLET | Freq: Every day | ORAL | 0 refills | Status: DC
  Filled 2021-08-19: qty 15, 30d supply, fill #0

## 2021-08-19 MED ORDER — SIMVASTATIN 10 MG PO TABS
ORAL_TABLET | Freq: Every evening | ORAL | 1 refills | Status: DC
  Filled 2021-08-19: qty 90, fill #0

## 2021-08-19 MED ORDER — SERTRALINE HCL 50 MG PO TABS
50.0000 mg | ORAL_TABLET | Freq: Every day | ORAL | 0 refills | Status: DC
  Filled 2021-08-19: qty 30, 30d supply, fill #0
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
## 2021-08-19 NOTE — Patient Instructions (Signed)

## 2021-08-20 ENCOUNTER — Other Ambulatory Visit: Payer: Self-pay

## 2021-09-14 ENCOUNTER — Ambulatory Visit (INDEPENDENT_AMBULATORY_CARE_PROVIDER_SITE_OTHER): Payer: Medicaid Other | Admitting: Nurse Practitioner

## 2021-09-14 ENCOUNTER — Encounter: Payer: Self-pay | Admitting: Nurse Practitioner

## 2021-09-14 VITALS — BP 118/68 | HR 76 | Temp 97.2°F | Ht 66.0 in | Wt 203.1 lb

## 2021-09-14 DIAGNOSIS — I10 Essential (primary) hypertension: Secondary | ICD-10-CM | POA: Diagnosis not present

## 2021-09-14 DIAGNOSIS — Z8619 Personal history of other infectious and parasitic diseases: Secondary | ICD-10-CM

## 2021-09-14 DIAGNOSIS — Z7689 Persons encountering health services in other specified circumstances: Secondary | ICD-10-CM

## 2021-09-14 DIAGNOSIS — R7303 Prediabetes: Secondary | ICD-10-CM

## 2021-09-14 DIAGNOSIS — Z90710 Acquired absence of both cervix and uterus: Secondary | ICD-10-CM

## 2021-09-14 DIAGNOSIS — Z8659 Personal history of other mental and behavioral disorders: Secondary | ICD-10-CM

## 2021-09-14 DIAGNOSIS — E785 Hyperlipidemia, unspecified: Secondary | ICD-10-CM

## 2021-09-14 DIAGNOSIS — F320 Major depressive disorder, single episode, mild: Secondary | ICD-10-CM | POA: Insufficient documentation

## 2021-09-14 MED ORDER — AMLODIPINE BESYLATE 5 MG PO TABS: 5.0000 mg | ORAL_TABLET | Freq: Every day | ORAL | 1 refills | Status: DC

## 2021-09-14 MED ORDER — SIMVASTATIN 10 MG PO TABS: ORAL_TABLET | Freq: Every evening | ORAL | 1 refills | Status: DC

## 2021-09-14 NOTE — Patient Instructions (Addendum)
Nice to see you today I will be in touch with the labs once we have the results Follow up with me in 6 week to recheck how you are doing with the medication   I would like you to set up a fasting lab appointment with in the next week to come back and get your labs drawn

## 2021-09-14 NOTE — Assessment & Plan Note (Signed)
Patient recently underwent med titration from Zoloft 25 mg Zoloft 50 mg p.o. medication 3 to 4 weeks age not quite at the therapeutic level.  Let patient follow-up to see how she is doing if need be titrated to Zoloft 100 mg.  Patient denies SI/HI/AVH.

## 2021-09-14 NOTE — Assessment & Plan Note (Signed)
Patient uses valacyclovir as needed flares.

## 2021-09-14 NOTE — Assessment & Plan Note (Signed)
Patient currently maintained on simvastatin 10 mg nightly.  Patient tolerated medication well.  Continue medication as prescribed pending labs.  Patient is not fasting today she will make a lab appointment for fasting labs
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
## 2021-09-14 NOTE — Assessment & Plan Note (Signed)
Pending lab results.  Continue working on healthy lifestyle modifications

## 2021-09-14 NOTE — Assessment & Plan Note (Signed)
Patient currently maintained on amlodipine 5 mg.  Patient tolerating medication well.  Does not check blood pressure at home but blood pressure within normal limits in office today.  Continue taking medication as prescribed

## 2021-09-17 ENCOUNTER — Other Ambulatory Visit: Payer: Self-pay | Admitting: Nurse Practitioner

## 2021-09-17 DIAGNOSIS — F411 Generalized anxiety disorder: Secondary | ICD-10-CM

## 2021-09-17 NOTE — Telephone Encounter (Signed)
Caller Name: Kenyette Gundy Call back phone #: [phone]  MEDICATION(S): sertraline (ZOLOFT) 50 MG tablet   Days of Med Remaining: couple of days  Has the patient contacted their pharmacy (YES/NO)?  No, filled originally by a different provider IF YES, when and what did the pharmacy advise?  IF NO, request that the patient contact the pharmacy for the refills in the future.             The pharmacy will send an electronic request (except for controlled medications).  Preferred Pharmacy: [facility], [place], [place] [id number]  ~~~Please advise patient/caregiver to allow 2-3 business days to process RX refills.

## 2021-09-17 NOTE — Telephone Encounter (Signed)
Can we verify that she is taking 3 '50mg'$  tablets for a total of '150mg'$  daily please

## 2021-09-17 NOTE — Telephone Encounter (Signed)
Spoke with patient and verified that she is taking 3 tablets of 50 mg and is using [employer] [address]

## 2021-09-18 ENCOUNTER — Other Ambulatory Visit: Payer: Self-pay | Admitting: Nurse Practitioner

## 2021-09-18 DIAGNOSIS — Z8659 Personal history of other mental and behavioral disorders: Secondary | ICD-10-CM

## 2021-09-18 DIAGNOSIS — F411 Generalized anxiety disorder: Secondary | ICD-10-CM

## 2021-09-18 MED ORDER — SERTRALINE HCL 50 MG PO TABS: 50.0000 mg | ORAL_TABLET | Freq: Every day | ORAL | 2 refills | Status: DC

## 2021-09-18 MED ORDER — SERTRALINE HCL 100 MG PO TABS: 150.0000 mg | ORAL_TABLET | Freq: Every day | ORAL | 2 refills | Status: DC

## 2021-09-18 NOTE — Progress Notes (Signed)
Patient called and altered of the direction change for the zoloft

## 2021-09-22 ENCOUNTER — Other Ambulatory Visit: Payer: Medicaid Other

## 2021-09-23 ENCOUNTER — Other Ambulatory Visit (INDEPENDENT_AMBULATORY_CARE_PROVIDER_SITE_OTHER): Payer: Medicaid Other

## 2021-09-23 DIAGNOSIS — E785 Hyperlipidemia, unspecified: Secondary | ICD-10-CM | POA: Diagnosis not present

## 2021-09-23 DIAGNOSIS — I10 Essential (primary) hypertension: Secondary | ICD-10-CM | POA: Diagnosis not present

## 2021-09-23 DIAGNOSIS — F320 Major depressive disorder, single episode, mild: Secondary | ICD-10-CM | POA: Diagnosis not present

## 2021-09-23 DIAGNOSIS — R7303 Prediabetes: Secondary | ICD-10-CM

## 2021-09-23 LAB — CBC WITH DIFFERENTIAL/PLATELET
Basophils Absolute: 0 10*3/uL (ref 0.0–0.1)
Basophils Relative: 1.1 % (ref 0.0–3.0)
Eosinophils Absolute: 0.5 10*3/uL (ref 0.0–0.7)
Eosinophils Relative: 15 % — ABNORMAL HIGH (ref 0.0–5.0)
HCT: 36.9 % (ref 36.0–46.0)
Hemoglobin: 12 g/dL (ref 12.0–15.0)
Lymphocytes Relative: 29.8 % (ref 12.0–46.0)
Lymphs Abs: 1 10*3/uL (ref 0.7–4.0)
MCHC: 32.5 g/dL (ref 30.0–36.0)
MCV: 88.5 fl (ref 78.0–100.0)
Monocytes Absolute: 0.3 10*3/uL (ref 0.1–1.0)
Monocytes Relative: 7.8 % (ref 3.0–12.0)
Neutro Abs: 1.6 10*3/uL (ref 1.4–7.7)
Neutrophils Relative %: 46.3 % (ref 43.0–77.0)
Platelets: 252 10*3/uL (ref 150.0–400.0)
RBC: 4.17 Mil/uL (ref 3.87–5.11)
RDW: 14.8 % (ref 11.5–15.5)
WBC: 3.5 10*3/uL — ABNORMAL LOW (ref 4.0–10.5)

## 2021-09-23 LAB — LIPID PANEL
Cholesterol: 174 mg/dL (ref 0–200)
HDL: 62.2 mg/dL (ref >39.00)
LDL Cholesterol: 89 mg/dL (ref 0–99)
NonHDL: 111.95
Total CHOL/HDL Ratio: 3
Triglycerides: 113 mg/dL (ref 0.0–149.0)
VLDL: 22.6 mg/dL (ref 0.0–40.0)

## 2021-09-23 LAB — HEMOGLOBIN A1C: Hgb A1c MFr Bld: 5.7 % (ref 4.6–6.5)

## 2021-09-23 LAB — COMPREHENSIVE METABOLIC PANEL
ALT: 11 U/L (ref 0–35)
AST: 13 U/L (ref 0–37)
Albumin: 3.9 g/dL (ref 3.5–5.2)
Alkaline Phosphatase: 41 U/L (ref 39–117)
BUN: 14 mg/dL (ref 6–23)
CO2: 29 mEq/L (ref 19–32)
Calcium: 9.5 mg/dL (ref 8.4–10.5)
Chloride: 105 mEq/L (ref 96–112)
Creatinine, Ser: 0.75 mg/dL (ref 0.40–1.20)
GFR: 84.56 mL/min (ref >60.00)
Glucose, Bld: 84 mg/dL (ref 70–99)
Potassium: 4 mEq/L (ref 3.5–5.1)
Sodium: 140 mEq/L (ref 135–145)
Total Bilirubin: 0.4 mg/dL (ref 0.2–1.2)
Total Protein: 6 g/dL (ref 6.0–8.3)

## 2021-09-23 LAB — TSH: TSH: 2.72 u[IU]/mL (ref 0.35–5.50)

## 2021-10-25 ENCOUNTER — Emergency Department
Admission: EM | Admit: 2021-10-25 | Discharge: 2021-10-25 | Disposition: A | Discharge status: Home / Self Care | Payer: Medicaid Other | Attending: Emergency Medicine | Admitting: Emergency Medicine

## 2021-10-25 ENCOUNTER — Other Ambulatory Visit: Payer: Self-pay

## 2021-10-25 ENCOUNTER — Encounter: Payer: Self-pay | Admitting: Emergency Medicine

## 2021-10-25 ENCOUNTER — Emergency Department: Payer: Medicaid Other

## 2021-10-25 DIAGNOSIS — J40 Bronchitis, not specified as acute or chronic: Secondary | ICD-10-CM | POA: Insufficient documentation

## 2021-10-25 DIAGNOSIS — I10 Essential (primary) hypertension: Secondary | ICD-10-CM | POA: Insufficient documentation

## 2021-10-25 DIAGNOSIS — R059 Cough, unspecified: Secondary | ICD-10-CM | POA: Diagnosis present

## 2021-10-25 MED ORDER — BENZONATATE 100 MG PO CAPS: 100.0000 mg | ORAL_CAPSULE | Freq: Three times a day (TID) | ORAL | 0 refills | Status: DC | PRN

## 2021-10-25 MED ORDER — ALBUTEROL SULFATE HFA 108 (90 BASE) MCG/ACT IN AERS: 2.0000 | INHALATION_SPRAY | Freq: Four times a day (QID) | RESPIRATORY_TRACT | 2 refills | Status: DC | PRN

## 2021-10-25 MED ORDER — SORE THROAT LOZENGES 6-10 MG MT LOZG
1.0000 | LOZENGE | OROMUCOSAL | 0 refills | Status: AC | PRN
Start: 2021-10-25 — End: 2021-11-01

## 2021-10-25 NOTE — ED Provider Notes (Signed)
[facility] Provider Note    Event Date/Time   First MD Initiated Contact with Patient 10/25/21 1414     (approximate)   History   Chief Complaint Cough   HPI Elaine Robinson is a 66 y.o. female, history of depression, hypertension, hyperlipidemia, arthritis, presents to the emergency department for evaluation of cough x7 days.  She reports cough as productive additionally endorses mild chest tightness and sore throat.  Denies fever/chills, chest pain, shortness of breath, abdominal pain, flank pain, nausea/vomiting, diarrhea, urinary symptoms, headache, rash/lesions, myalgias, or dizziness/lightheadedness.  History Limitations: No limitations.        Physical Exam  Triage Vital Signs: ED Triage Vitals  Enc Vitals Group     BP 10/25/21 1311 111/79     Pulse Rate 10/25/21 1311 81     Resp 10/25/21 1311 20     Temp 10/25/21 1311 98.9 F (37.2 C)     Temp Source 10/25/21 1311 Oral     SpO2 10/25/21 1311 97 %     Weight 10/25/21 1311 200 lb (90.7 kg)     Height 10/25/21 1311 '5\' 6"'$  (1.676 m)     Head Circumference --      Peak Flow --      Pain Score 10/25/21 1314 6     Pain Loc --      Pain Edu? --      Excl. in [place]? --     Most recent vital signs: Vitals:   10/25/21 1311  BP: 111/79  Pulse: 81  Resp: 20  Temp: 98.9 F (37.2 C)  SpO2: 97%    General: Awake, NAD.  Dry cough present in the room Skin: Warm, dry. No rashes or lesions.  Eyes: PERRL. Conjunctivae normal.  CV: Good peripheral perfusion.  Resp: Normal effort.  Lung sounds are clear bilaterally in the apices and bases. Abd: Soft, non-tender. No distention.  Neuro: At baseline. No gross neurological deficits.   Focused Exam: Throat exam unremarkable.  No tonsillar swelling or exudates.  Uvula midline.  Physical Exam    ED Results / Procedures / Treatments  Labs (all labs ordered are listed, but only abnormal results are displayed) Labs Reviewed - No data to  display   EKG N/A.   RADIOLOGY  ED Provider Interpretation: I personally viewed and interpreted this chest x-ray, no evidence of focal consolidations or active cardiopulmonary disease  DG Chest 2 View  Result Date: 10/25/2021 CLINICAL DATA:  Productive cough. EXAM: CHEST - 2 VIEW COMPARISON:  CT chest for lung cancer screening 07/14/2021 FINDINGS: Heart size is normal. Lungs are clear. Lung volumes are low. No edema or effusion present. The visualized soft tissues and bony thorax are unremarkable. IMPRESSION: 1. Low lung volumes. 2. No acute cardiopulmonary disease. Electronically Signed   By: San Morelle M.D.   On: 10/25/2021 13:57    PROCEDURES:  Critical Care performed: N/A.  Procedures    MEDICATIONS ORDERED IN ED: Medications - No data to display   IMPRESSION / MDM / [place] / ED COURSE  I reviewed the triage vital signs and the nursing notes.                              Differential diagnosis includes, but is not limited to, community-acquired pneumonia, bronchitis, viral URI, COVID-19, influenza, strep pharyngitis.   Assessment/Plan Presentation consistent with viral URI versus bronchitis.  Chest x-ray shows no evidence  of pneumonia.  Patient appears clinically well.  Vital signs within normal limits.  She states that over the past 7 days, her symptoms have slightly improved, but continues to have persistent cough.  We will provide her with benzonatate, albuterol inhaler, and throat lozenges.  Encouraged her to follow-up with her primary care provider as needed if symptoms fail to improve.  Patient's presentation is most consistent with acute complicated illness / injury requiring diagnostic workup.   Provided the patient with anticipatory guidance, return precautions, and educational material. Encouraged the patient to return to the emergency department at any time if they begin to experience any new or worsening symptoms. Patient expressed  understanding and agreed with the plan.       FINAL CLINICAL IMPRESSION(S) / ED DIAGNOSES   Final diagnoses:  Bronchitis     Rx / DC Orders   ED Discharge Orders          Ordered    albuterol (VENTOLIN HFA) 108 (90 Base) MCG/ACT inhaler  Every 6 hours PRN        10/25/21 1420    benzonatate (TESSALON PERLES) 100 MG capsule  3 times daily PRN        10/25/21 1420    benzocaine-menthol (SORE THROAT LOZENGES) 6-10 MG lozenge  As needed        10/25/21 1422             Note:  This document was prepared using Dragon voice recognition software and may include unintentional dictation errors.   Teodoro Spray, [place] 10/25/21 1428    Duffy Bruce, MD 10/28/21 1101

## 2021-10-25 NOTE — Discharge Instructions (Addendum)
-  You may take the albuterol inhaler as needed for the chest tightness.  You may take the benzonatate as needed for cough.  You may take the throat lozenges as needed for sore throat.  -Follow-up with your primary care provider within the next 5 days if your symptoms fail to improve.   -Return to the emergency department anytime if you begin to experience any new or worsening symptoms

## 2021-10-25 NOTE — ED Triage Notes (Signed)
Pt via POV from home. Pt c/o productive cough for the last week. Pt also c/o sore throat. Pt has a hx of emphysema. Denies any SOB. Pt is A&Ox4 and NAD.

## 2021-11-02 ENCOUNTER — Ambulatory Visit (INDEPENDENT_AMBULATORY_CARE_PROVIDER_SITE_OTHER): Payer: Medicaid Other | Admitting: Nurse Practitioner

## 2021-11-02 ENCOUNTER — Encounter: Payer: Self-pay | Admitting: Nurse Practitioner

## 2021-11-02 VITALS — BP 112/68 | HR 74 | Temp 98.3°F | Resp 16 | Ht 66.0 in | Wt 202.1 lb

## 2021-11-02 DIAGNOSIS — F439 Reaction to severe stress, unspecified: Secondary | ICD-10-CM | POA: Diagnosis not present

## 2021-11-02 DIAGNOSIS — Z8619 Personal history of other infectious and parasitic diseases: Secondary | ICD-10-CM

## 2021-11-02 DIAGNOSIS — R7989 Other specified abnormal findings of blood chemistry: Secondary | ICD-10-CM | POA: Diagnosis not present

## 2021-11-02 DIAGNOSIS — Z8659 Personal history of other mental and behavioral disorders: Secondary | ICD-10-CM

## 2021-11-02 DIAGNOSIS — F320 Major depressive disorder, single episode, mild: Secondary | ICD-10-CM

## 2021-11-02 LAB — CBC
HCT: 36.2 % (ref 36.0–46.0)
Hemoglobin: 11.9 g/dL — ABNORMAL LOW (ref 12.0–15.0)
MCHC: 33 g/dL (ref 30.0–36.0)
MCV: 87.9 fl (ref 78.0–100.0)
Platelets: 287 10*3/uL (ref 150.0–400.0)
RBC: 4.12 Mil/uL (ref 3.87–5.11)
RDW: 14.1 % (ref 11.5–15.5)
WBC: 4.7 10*3/uL (ref 4.0–10.5)

## 2021-11-02 MED ORDER — VALACYCLOVIR HCL 1 G PO TABS: 1000.0000 mg | ORAL_TABLET | Freq: Two times a day (BID) | ORAL | 0 refills | Status: AC

## 2021-11-02 NOTE — Assessment & Plan Note (Signed)
With her living situation.  States when she moved here she did apply for the [place] low income housing but they will not be ready until next year.  States she had to move from her apartment to rent went up and she can no longer afford on a fixed income.  Will refer to social work to see if there are any resources in the community to help patient out.  Ambulatory referral placed today

## 2021-11-02 NOTE — Assessment & Plan Note (Signed)
Patient currently maintained on sertraline 150 mg daily.  PHQ-9 and GAD-7 administered in office.  Patient denies HI/SI/AVH.  Do feel like she would benefit from therapy as she used to have this in the past.  Ambulatory referral was placed today

## 2021-11-02 NOTE — Assessment & Plan Note (Signed)
Patient had leukopenia last CBC.  Recheck today, pending result

## 2021-11-02 NOTE — Assessment & Plan Note (Signed)
Patient currently on valacyclovir 1 g daily for suppression therapy she is having an outbreak we will hold the valacyclovir 1 g daily and bump her up to valacyclovir 1 g twice daily for 7 days to get lesions in remission.  When she finishes that prescription she will return back to the valacyclovir 1 g daily.

## 2021-11-02 NOTE — Patient Instructions (Signed)
Nice to see you today I sent in a new prescription for the Valtrex (valacyclovir) the new one is a 1,'000mg'$  tablet you will take twice a day for a week. Do not take the other Valtrex '500mg'$  prescription while on this one. Once finished with the new prescription you can continue the older prescription of the Valtrex I want to see you in 6 months, sooner if you need me

## 2021-11-02 NOTE — Progress Notes (Signed)
Established Patient Office Visit  Subjective   Patient ID: Elaine Robinson, female    DOB: July 09, 1955  Age: 66 y.o. MRN: [id number]  Chief Complaint  Patient presents with   Anxiety    Depression/Anxiety: Patient was seen by me approx 6 weeks ago as a new patinet. She was maintained on zoloft '150mg'$  daily. This was a recent dose change per patient report. Her PHQ-9 was positive. Her for a follow up on depression and medicaiton   States that she moved because her rent went up too high to afford. She goes to a large chruch and moved in to the upstairs that   Herpes: currently having an outbreak and states the valtrex is not working patient is currently on a suppression dose but has been dealing with lots of stress and anxiety dealing with her living situation currently      11/02/2021    9:04 AM 09/14/2021    4:01 PM 08/05/2021   12:05 PM  PHQ9 SCORE ONLY  PHQ-9 Total Score '9 6 7      '$ 11/02/2021    9:05 AM 09/14/2021    4:03 PM 08/05/2021   12:12 PM 07/08/2021    9:10 AM  GAD 7 : Generalized Anxiety Score  Nervous, Anxious, on Edge '1 1 1 1  '$ Control/stop worrying '1 1 3 3  '$ Worry too much - different things '1 1 3 3  '$ Trouble relaxing '1 1 2 2  '$ Restless 0 '1 1 1  '$ Easily annoyed or irritable '1 1 3 3  '$ Afraid - awful might happen '1 1 3 3  '$ Total GAD 7 Score '6 7 16 16  '$ Anxiety Difficulty Somewhat difficult Somewhat difficult Very difficult Very difficult      Review of Systems  Constitutional:  Negative for chills and fever.  Respiratory:  Negative for shortness of breath.   Cardiovascular:  Negative for chest pain.  Psychiatric/Behavioral:  Negative for hallucinations and suicidal ideas.       Objective:     BP 112/68   Pulse 74   Temp 98.3 F (36.8 C)   Resp 16   Ht '5\' 6"'$  (1.676 m)   Wt 202 lb 2 oz (91.7 kg)   SpO2 98%   BMI 32.62 kg/m    Physical Exam Vitals and nursing note reviewed.  Constitutional:      Appearance: Normal appearance.  Cardiovascular:     Rate  and Rhythm: Normal rate and regular rhythm.     Heart sounds: Normal heart sounds.  Pulmonary:     Effort: Pulmonary effort is normal.     Breath sounds: Normal breath sounds.  Abdominal:     General: Bowel sounds are normal.  Neurological:     Mental Status: She is alert.  Psychiatric:        Attention and Perception: Attention normal.        Mood and Affect: Affect is tearful.        Speech: Speech normal.        Cognition and Memory: Cognition and memory normal.      No results found for any visits on 11/02/21.    The 10-year ASCVD risk score (Arnett DK, et al., 2019) is: 9.5%    Assessment & Plan:   Problem List Items Addressed This Visit       Other   History of depression - Primary   Relevant Orders   Ambulatory referral to Psychology   History of herpes genitalis    Patient  currently on valacyclovir 1 g daily for suppression therapy she is having an outbreak we will hold the valacyclovir 1 g daily and bump her up to valacyclovir 1 g twice daily for 7 days to get lesions in remission.  When she finishes that prescription she will return back to the valacyclovir 1 g daily.      Relevant Medications   valACYclovir (VALTREX) 1000 MG tablet   Depression, major, single episode, mild ([place])    Patient currently maintained on sertraline 150 mg daily.  PHQ-9 and GAD-7 administered in office.  Patient denies HI/SI/AVH.  Do feel like she would benefit from therapy as she used to have this in the past.  Ambulatory referral was placed today      Abnormal CBC    Patient had leukopenia last CBC.  Recheck today, pending result      Relevant Orders   CBC   Stress    With her living situation.  States when she moved here she did apply for the [place] low income housing but they will not be ready until next year.  States she had to move from her apartment to rent went up and she can no longer afford on a fixed income.  Will refer to social work to see if there are any  resources in the community to help patient out.  Ambulatory referral placed today      Relevant Orders   Ambulatory referral to Social Work    Return in about 6 months (around 05/05/2022) for For recheck.    [name], NP

## 2021-11-06 ENCOUNTER — Other Ambulatory Visit: Payer: Self-pay | Admitting: Nurse Practitioner

## 2021-11-06 DIAGNOSIS — F320 Major depressive disorder, single episode, mild: Secondary | ICD-10-CM

## 2021-11-06 NOTE — Progress Notes (Signed)
Orders only

## 2021-12-07 ENCOUNTER — Other Ambulatory Visit: Payer: Self-pay

## 2021-12-07 DIAGNOSIS — Z8619 Personal history of other infectious and parasitic diseases: Secondary | ICD-10-CM

## 2021-12-07 MED ORDER — VALACYCLOVIR HCL 500 MG PO TABS: 500.0000 mg | ORAL_TABLET | Freq: Two times a day (BID) | ORAL | 2 refills | Status: DC

## 2021-12-07 NOTE — Telephone Encounter (Signed)
MEDICATION: valACYclovir (VALTREX) 500 MG tablet  PHARMACY: [facility] - [place] (N), [place] - [address]  Comments: Patient is completely out.   **Let patient know to contact pharmacy at the end of the day to make sure medication is ready. **  ** Please notify patient to allow 48-72 hours to process**  **Encourage patient to contact the pharmacy for refills or they can request refills through [facility]** .

## 2021-12-07 NOTE — Telephone Encounter (Signed)
Spoke with Elaine Robinson at [facility]. PA is not needed as Karl Ito is covered provider under patient's insurance. RX for Valtrex went through for $4. I asked them to cancel RX on file for the provider with [facility] that was sent in the past.  Called patient on both numbers and left message on cell number to call to let her know everything was worked out. RX is ready.

## 2021-12-07 NOTE — Telephone Encounter (Signed)
I called [facility] [employer] at [phone] to get a verbal prior auth for valACYclovir (Valtrex) HCl '500MG'$  tablets.  I was unable to get a prior auth on cover my meds.  I spoke to [facility], who stated that the prescribing provider, Caryl Asp, NPI [id number], is not in their system and is therefore unable to write any prescriptions that would be covered by [facility].

## 2021-12-07 NOTE — Telephone Encounter (Signed)
Patient called back states that refill was ready at [facility] states that they needed authorization from our office. States that insurance will not cover and she can not afford the 30 dollars to get.

## 2021-12-07 NOTE — Telephone Encounter (Signed)
Provider that refilled it is not her PCP anymore, this has to be filled by [facility] and then I will call [facility] and cancel other RX and make sure it goes through under [facility]

## 2021-12-08 NOTE — Telephone Encounter (Signed)
Patient advised.

## 2021-12-29 ENCOUNTER — Encounter: Payer: Self-pay | Admitting: Nurse Practitioner

## 2021-12-29 ENCOUNTER — Ambulatory Visit (INDEPENDENT_AMBULATORY_CARE_PROVIDER_SITE_OTHER): Payer: Medicaid Other | Admitting: Nurse Practitioner

## 2021-12-29 ENCOUNTER — Telehealth: Payer: Self-pay | Admitting: *Deleted

## 2021-12-29 ENCOUNTER — Other Ambulatory Visit: Payer: Self-pay | Admitting: Nurse Practitioner

## 2021-12-29 VITALS — BP 100/62 | HR 76 | Ht 66.0 in | Wt 204.8 lb

## 2021-12-29 DIAGNOSIS — Z9889 Other specified postprocedural states: Secondary | ICD-10-CM

## 2021-12-29 DIAGNOSIS — N644 Mastodynia: Secondary | ICD-10-CM

## 2021-12-29 NOTE — Telephone Encounter (Signed)
Patient has an appointment scheduled today 12/29/21 with Gaetana Michaelis NP at 11:00 am.

## 2021-12-29 NOTE — Patient Instructions (Addendum)
Nice to see you today Call the [facility] breast center and get the mammogram scheduled Follow up in January for your physical, sooner if you need me

## 2021-12-29 NOTE — Progress Notes (Signed)
   Acute Office Visit  Subjective:     Patient ID: Elaine Robinson, female    DOB: Aug 17, 1955, 66 y.o.   MRN: [id number]  Chief Complaint  Patient presents with   Breast Pain    Having pain in her right breast that started about 2 weeks ago , dull pain , no swelling or no drainage , no redness having some tenderness  on the outter side of breast , hx of bx of both , last mammogram that she can reminder was last June     HPI Patient is in today for Breast pain  States that it is right sided and started approx 2 wweeks ago. States that it is constant. States that today not feeling it so much. Pain is described as a dull pain. Not tender to the touch. History of breast biopsy but no cancer.  No bleeding, discharge or change in size. Patient has not tried anything over-the-counter for pain thus far.  Review of Systems  Constitutional:  Negative for chills and fever.  Respiratory:  Negative for shortness of breath.   Cardiovascular:  Negative for chest pain.        Objective:    BP 100/62   Pulse 76   Ht '5\' 6"'$  (1.676 m)   Wt 204 lb 12.8 oz (92.9 kg)   SpO2 97%   BMI 33.06 kg/m    Physical Exam Vitals and nursing note reviewed. Exam conducted with a chaperone present Mardelle Matte, CMA).  Constitutional:      Appearance: Normal appearance.  Cardiovascular:     Rate and Rhythm: Normal rate and regular rhythm.     Heart sounds: Normal heart sounds.  Pulmonary:     Effort: Pulmonary effort is normal.     Breath sounds: Normal breath sounds.  Chest:       Comments: Tenderness to palpation.  Unable to feel a discrete mass or not patient does have larger breasts and more breast tissue. Lymphadenopathy:     Upper Body:     Right upper body: No supraclavicular or axillary adenopathy.     Left upper body: No supraclavicular or axillary adenopathy.  Neurological:     Mental Status: She is alert.     No results found for any visits on 12/29/21.      Assessment &  Plan:   Problem List Items Addressed This Visit       Other   Breast pain - Primary    Patient's last mammogram was 01/13/2021.  It did not show any acute findings just bilateral benign calcifications.  No history of breast cancer in the family or personally.  We will go ahead and obtain diagnostic bilateral mammograms to rule out lumps or breast abnormalities.  Patient courage to try over-the-counter analgesics to help with discomfort.  Follow-up if no improvement      Relevant Orders   MM DIAG BREAST TOMO BILATERAL   History of breast biopsy   Relevant Orders   MM DIAG BREAST TOMO BILATERAL    No orders of the defined types were placed in this encounter.   Return in about 4 months (around 04/30/2022) for CPE and labs.  [name], NP

## 2021-12-29 NOTE — Assessment & Plan Note (Signed)
Patient's last mammogram was 01/13/2021.  It did not show any acute findings just bilateral benign calcifications.  No history of breast cancer in the family or personally.  We will go ahead and obtain diagnostic bilateral mammograms to rule out lumps or breast abnormalities.  Patient courage to try over-the-counter analgesics to help with discomfort.  Follow-up if no improvement

## 2021-12-29 NOTE — Telephone Encounter (Signed)
Evaluated in office  

## 2021-12-29 NOTE — Telephone Encounter (Signed)
PLEASE NOTE: All timestamps contained within this report are represented as [place] Standard Time. CONFIDENTIALTY NOTICE: This fax transmission is intended only for the addressee. It contains information that is legally privileged, confidential or otherwise protected from use or disclosure. If you are not the intended recipient, you are strictly prohibited from reviewing, disclosing, copying using or disseminating any of this information or taking any action in reliance on or regarding this information. If you have received this fax in error, please notify us immediately by telephone so that we can arrange for its return to [place]. Phone: [phone], Toll-Free: [phone], Fax: [phone] Page: 1 of 2 Call Id: [id number] [place] RECORD [employer] Patient Name: Elaine Robinson Gender: Female DOB: Jun 24, 1955 Age: 66 Y 11 M 17 D Return Phone Number: [phone] (Primary) Address: City/ State/ Zip: [place]  [address] Client [facility] Night - Client Client Site [place] Provider [name]- NP Contact Type Call Who Is Calling Patient / Member / Family / Caregiver Call Type Triage / Clinical Relationship To Patient Self Return Phone Number [phone] (Primary) Chief Complaint Breast Symptoms Reason for Call Symptomatic / Request for [name] states she needs an appointment. She has pain in her right breast. She wanted to speak to a nurse. Translation No Nurse Assessment Nurse: Fredderick Phenix, RN, Lelan Pons Date/Time (Eastern Time): 12/28/2021 8:23:10 AM Confirm and document reason for call. If symptomatic, describe symptoms. ---Caller states she has pain on side of her right breast. States its been bothering her for a couple of weeks. Had biopsy previously on the breast. Afebrile. Does the patient have any new or worsening symptoms?  ---Yes Will a triage be completed? ---Yes Related visit to physician within the last 2 weeks? ---No Does the PT have any chronic conditions? (i.e. diabetes, asthma, this includes High risk factors for pregnancy, etc.) ---Yes List chronic conditions. ---HTN, hyperlipidemia Is this a behavioral health or substance abuse call? ---No Guidelines Guideline Title Affirmed Question Affirmed Notes Nurse Date/Time Eilene Ghazi Time) Breast Symptoms [1] Breast pain AND [2] cause is not known Fredderick Phenix, RN, Lelan Pons 12/28/2021 8:26:06 AM Disp. Time Eilene Ghazi Time) Disposition Final User 12/28/2021 8:28:21 AM See PCP within 2 Weeks Yes Fredderick Phenix, RN, Lelan Pons Final Disposition 12/28/2021 8:28:21 AM See PCP within 2 Weeks Yes Fredderick Phenix, RN, Karie Chimera NOTE: All timestamps contained within this report are represented as [place] Standard Time. CONFIDENTIALTY NOTICE: This fax transmission is intended only for the addressee. It contains information that is legally privileged, confidential or otherwise protected from use or disclosure. If you are not the intended recipient, you are strictly prohibited from reviewing, disclosing, copying using or disseminating any of this information or taking any action in reliance on or regarding this information. If you have received this fax in error, please notify us immediately by telephone so that we can arrange for its return to [place]. Phone: [phone], Toll-Free: [phone], Fax: [phone] Page: 2 of 2 Call Id: [id number] [place] Disagree/Comply Comply Caller Understands Yes PreDisposition Did not know what to do Care Advice Given Per Guideline SEE PCP WITHIN 2 WEEKS: * You need to be seen for this ongoing problem within the next 2 weeks. CALL BACK IF: * Fever or redness occurs * You become worse CARE ADVICE given per Breast Symptoms (Adult) guideline. Referrals REFERRED TO PCP OFFICE

## 2022-01-05 ENCOUNTER — Other Ambulatory Visit: Payer: Self-pay | Admitting: Nurse Practitioner

## 2022-01-05 DIAGNOSIS — Z8659 Personal history of other mental and behavioral disorders: Secondary | ICD-10-CM

## 2022-01-05 MED ORDER — SERTRALINE HCL 100 MG PO TABS: 150.0000 mg | ORAL_TABLET | Freq: Every day | ORAL | 5 refills | Status: DC

## 2022-01-05 NOTE — Telephone Encounter (Signed)
Patient advised, medication refilled.

## 2022-01-05 NOTE — Telephone Encounter (Signed)
  Encourage patient to contact the pharmacy for refills or they can request refills through [facility]  Did the patient contact the pharmacy: No  LAST APPOINTMENT DATE: 12/29/2021  NEXT APPOINTMENT DATE: 04/30/2022  MEDICATION: sertraline (ZOLOFT) 100 MG tablet  Is the patient out of medication? No  If not, how much is left? 2 days  PHARMACY: [facility] ([address]), [place]  Let patient know to contact pharmacy at the end of the day to make sure medication is ready.  Please notify patient to allow 48-72 hours to process

## 2022-01-15 ENCOUNTER — Other Ambulatory Visit: Payer: Medicaid Other

## 2022-01-15 ENCOUNTER — Inpatient Hospital Stay: Admission: RE | Admit: 2022-01-15 | Payer: Medicaid Other | Source: Ambulatory Visit

## 2022-02-02 ENCOUNTER — Other Ambulatory Visit: Payer: Self-pay

## 2022-02-02 DIAGNOSIS — M19019 Primary osteoarthritis, unspecified shoulder: Secondary | ICD-10-CM

## 2022-02-02 DIAGNOSIS — M19041 Primary osteoarthritis, right hand: Secondary | ICD-10-CM

## 2022-02-05 ENCOUNTER — Ambulatory Visit
Admission: RE | Admit: 2022-02-05 | Discharge: 2022-02-05 | Disposition: A | Discharge status: Home / Self Care | Payer: Medicaid Other | Source: Ambulatory Visit | Attending: Nurse Practitioner | Admitting: Nurse Practitioner

## 2022-02-05 DIAGNOSIS — Z9889 Other specified postprocedural states: Secondary | ICD-10-CM

## 2022-02-05 DIAGNOSIS — N644 Mastodynia: Secondary | ICD-10-CM | POA: Diagnosis present

## 2022-02-08 ENCOUNTER — Other Ambulatory Visit (HOSPITAL_COMMUNITY): Payer: Self-pay

## 2022-02-12 ENCOUNTER — Ambulatory Visit
Admission: RE | Admit: 2022-02-12 | Discharge: 2022-02-12 | Disposition: A | Discharge status: Home / Self Care | Payer: Disability Insurance | Source: Ambulatory Visit

## 2022-02-12 ENCOUNTER — Ambulatory Visit
Admission: RE | Admit: 2022-02-12 | Discharge: 2022-02-12 | Disposition: A | Discharge status: Home / Self Care | Payer: Disability Insurance

## 2022-02-12 ENCOUNTER — Other Ambulatory Visit: Payer: Self-pay

## 2022-02-12 DIAGNOSIS — M19042 Primary osteoarthritis, left hand: Secondary | ICD-10-CM

## 2022-02-12 DIAGNOSIS — M19041 Primary osteoarthritis, right hand: Secondary | ICD-10-CM | POA: Insufficient documentation

## 2022-02-12 DIAGNOSIS — M19019 Primary osteoarthritis, unspecified shoulder: Secondary | ICD-10-CM | POA: Insufficient documentation

## 2022-03-07 ENCOUNTER — Other Ambulatory Visit: Payer: Self-pay | Admitting: Nurse Practitioner

## 2022-03-07 DIAGNOSIS — E785 Hyperlipidemia, unspecified: Secondary | ICD-10-CM

## 2022-03-09 ENCOUNTER — Other Ambulatory Visit: Payer: Self-pay | Admitting: Nurse Practitioner

## 2022-03-09 DIAGNOSIS — Z8619 Personal history of other infectious and parasitic diseases: Secondary | ICD-10-CM

## 2022-03-09 DIAGNOSIS — I10 Essential (primary) hypertension: Secondary | ICD-10-CM

## 2022-03-09 NOTE — Telephone Encounter (Signed)
Patient called and stated that she needs medications  amLODipine (NORVASC) 5 MG tablet , valACYclovir (VALTREX) 500 MG tablet   Sent to  [facility] ([address]), [address] Phone: [phone]  Fax: [phone]

## 2022-03-10 MED ORDER — VALACYCLOVIR HCL 500 MG PO TABS: 500.0000 mg | ORAL_TABLET | Freq: Two times a day (BID) | ORAL | 2 refills | Status: DC

## 2022-03-10 MED ORDER — AMLODIPINE BESYLATE 5 MG PO TABS: 5.0000 mg | ORAL_TABLET | Freq: Every day | ORAL | 1 refills | Status: DC

## 2022-03-16 IMAGING — MG DIGITAL DIAGNOSTIC BILAT W/ TOMO W/ CAD
8 of 14 series · 8 of 30 positions shown · non-contrast
Comparison: Previous exam(s).

CLINICAL DATA: One year follow-up of probably benign bilateral
breast calcifications.

EXAM:
DIGITAL DIAGNOSTIC BILATERAL MAMMOGRAM WITH TOMOSYNTHESIS AND CAD
TECHNIQUE: Bilateral digital diagnostic mammography and breast tomosynthesis
was performed. The images were evaluated with computer-aided
detection.

[L CC (1 of 2)]
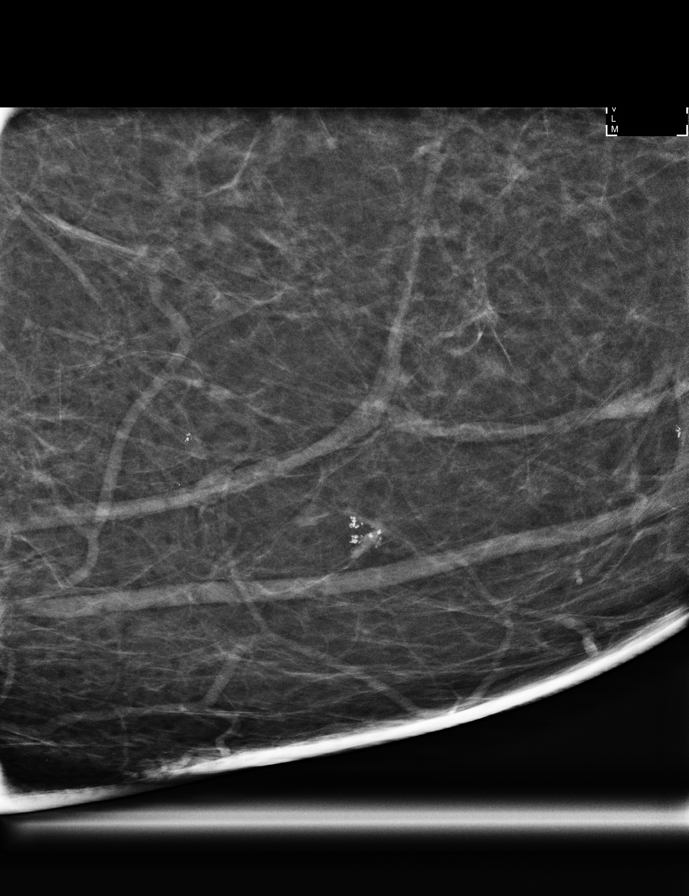

[R CC]
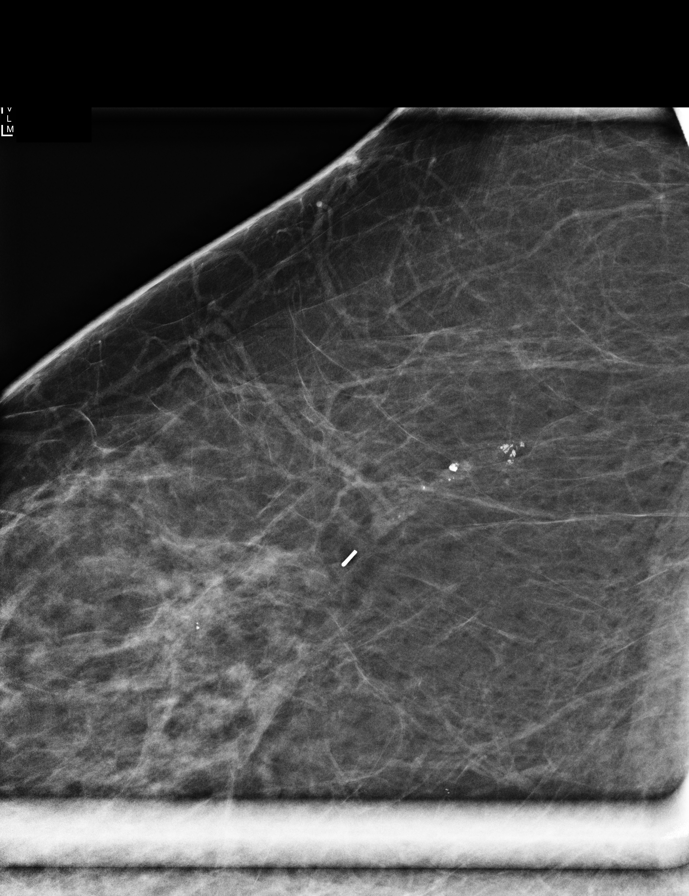

[R ML]
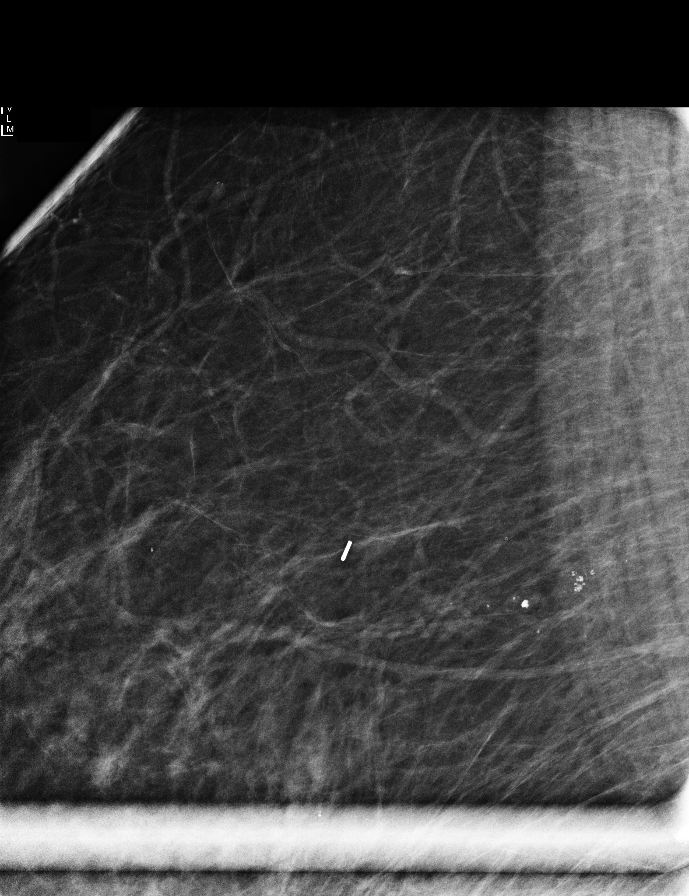

[L ML (1 of 2)]
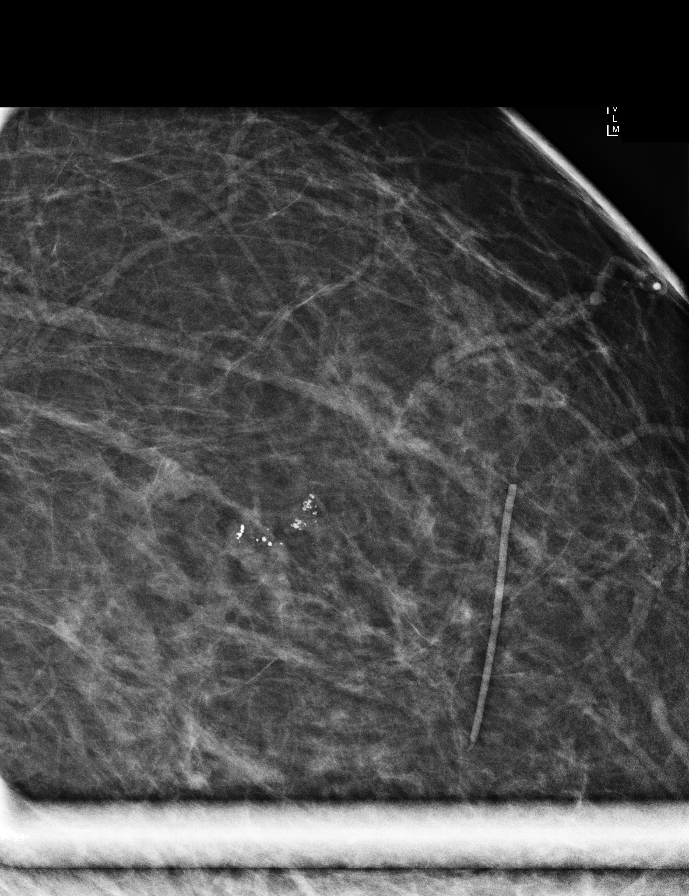

[L ML (2 of 2)]
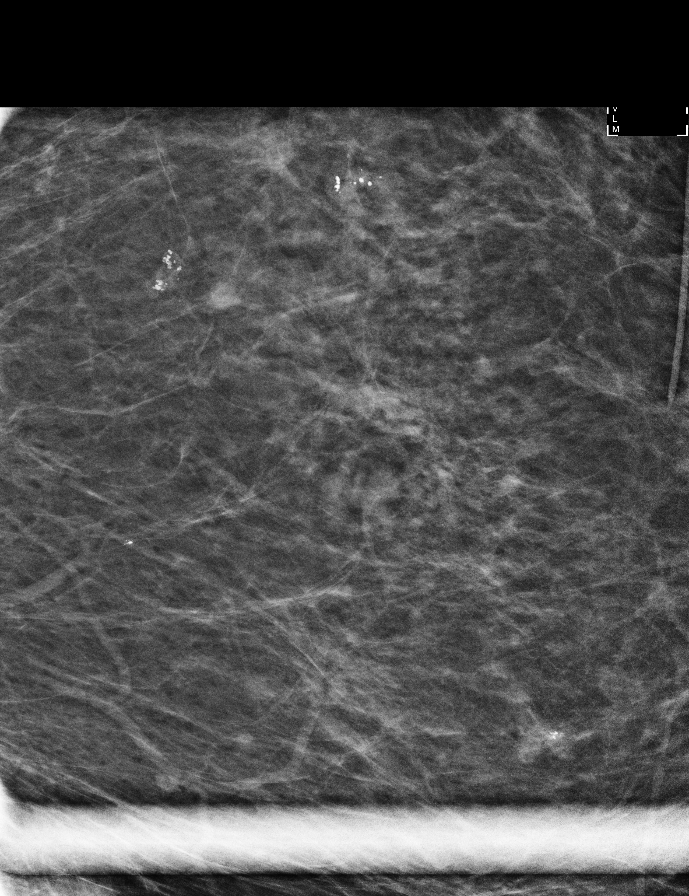

[L CC (2 of 2)]
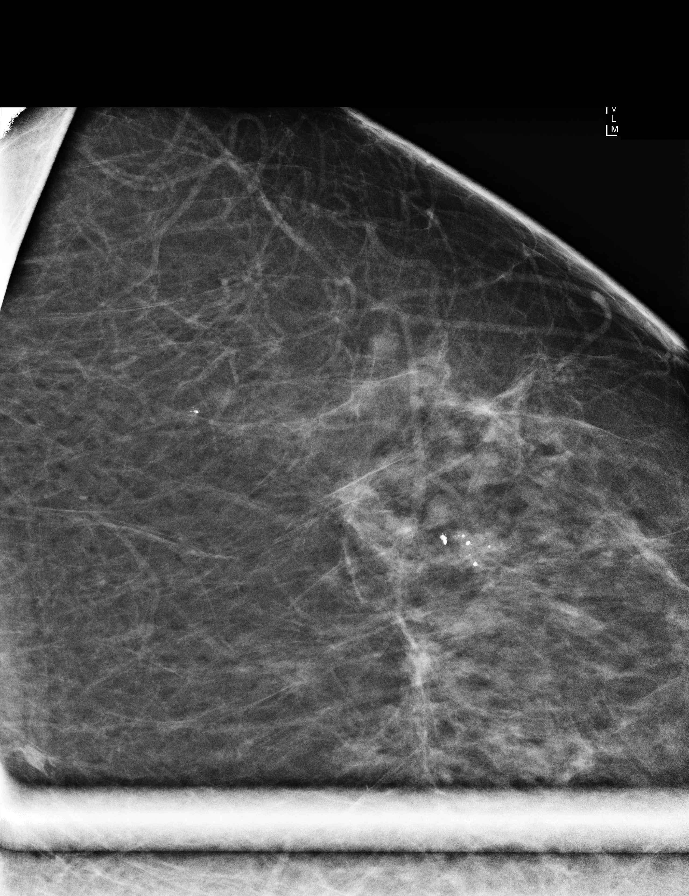

[L CC synth-2D]
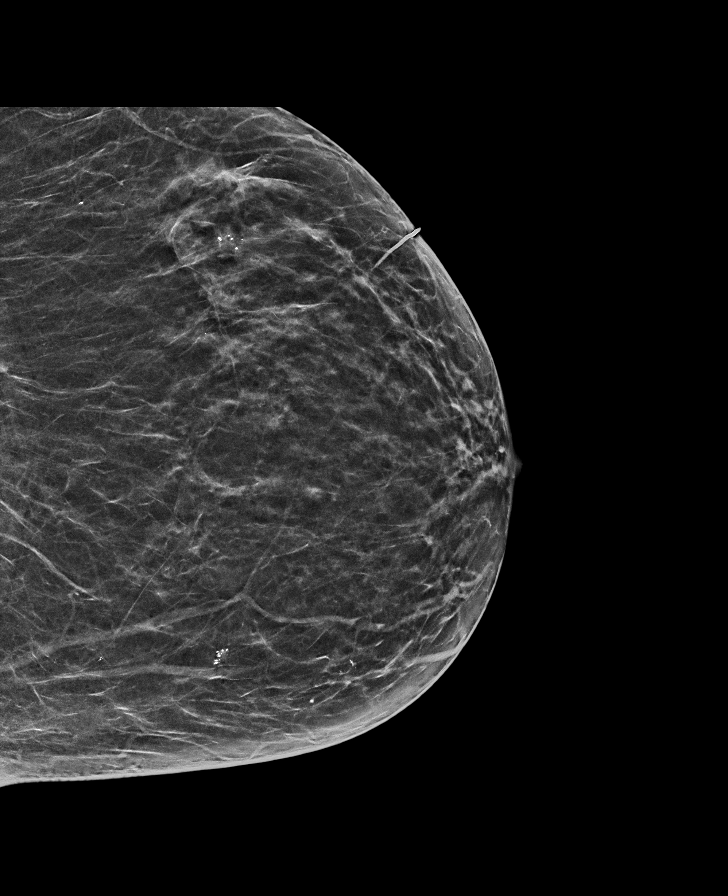

[R MLO synth-2D]
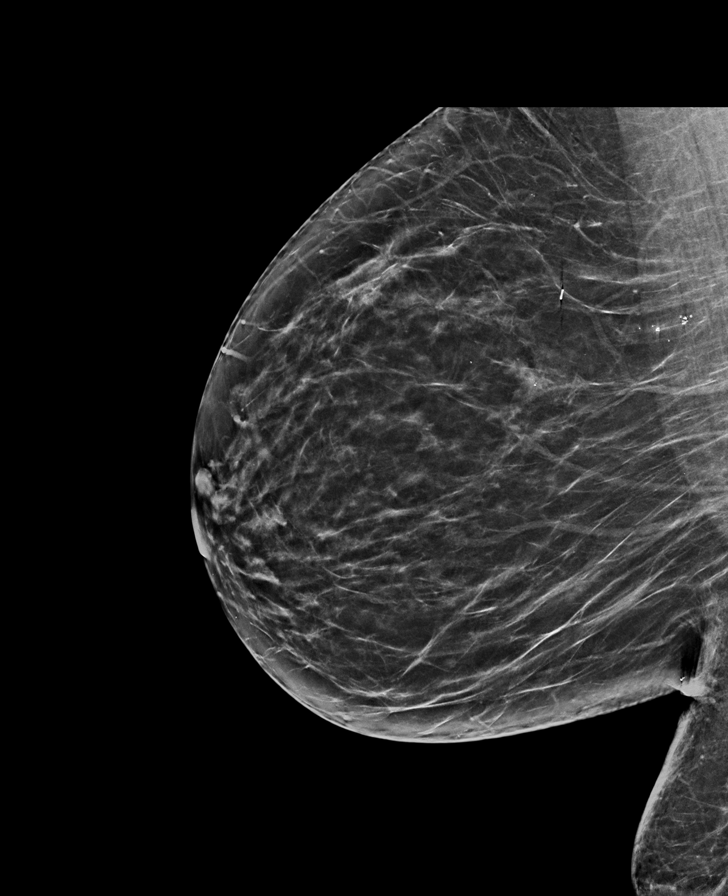

[8 of 30 positions shown; findings below may reference images not displayed]

ACR Breast Density Category b: There are scattered areas of
fibroglandular density.
FINDINGS: Mammographically, there are no new suspicious masses, areas of
architectural distortion or suspicious calcifications. There are
bilateral stable probably benign calcifications, likely representing
fibroadenomatoid changes.
IMPRESSION: Bilateral stable probably benign calcifications.

RECOMMENDATION:
Diagnostic mammogram is suggested in 1 year. (Code:ZD-B-YLO)

I have discussed the findings and recommendations with the patient.
If applicable, a reminder letter will be sent to the patient
regarding the next appointment.

BI-RADS CATEGORY  3: Probably benign.

## 2022-03-29 ENCOUNTER — Ambulatory Visit (INDEPENDENT_AMBULATORY_CARE_PROVIDER_SITE_OTHER): Payer: Medicaid Other | Admitting: Nurse Practitioner

## 2022-03-29 ENCOUNTER — Encounter: Payer: Self-pay | Admitting: Nurse Practitioner

## 2022-03-29 ENCOUNTER — Ambulatory Visit (INDEPENDENT_AMBULATORY_CARE_PROVIDER_SITE_OTHER)
Admission: RE | Admit: 2022-03-29 | Discharge: 2022-03-29 | Disposition: A | Discharge status: Home / Self Care | Payer: Medicaid Other | Source: Ambulatory Visit | Attending: Nurse Practitioner | Admitting: Nurse Practitioner

## 2022-03-29 VITALS — BP 128/58 | HR 74 | Temp 97.8°F | Ht 66.0 in | Wt 204.0 lb

## 2022-03-29 DIAGNOSIS — M545 Low back pain, unspecified: Secondary | ICD-10-CM

## 2022-03-29 DIAGNOSIS — R200 Anesthesia of skin: Secondary | ICD-10-CM

## 2022-03-29 DIAGNOSIS — M6289 Other specified disorders of muscle: Secondary | ICD-10-CM | POA: Diagnosis not present

## 2022-03-29 MED ORDER — CYCLOBENZAPRINE HCL 5 MG PO TABS: 5.0000 mg | ORAL_TABLET | Freq: Two times a day (BID) | ORAL | 0 refills | Status: DC | PRN

## 2022-03-29 NOTE — Progress Notes (Signed)
Acute Office Visit  Subjective:     Patient ID: Elaine Robinson, female    DOB: 1955-08-03, 66 y.o.   MRN: [id number]  Chief Complaint  Patient presents with   Back Pain    A week ago, lifted something heavy and lower back has been hurting since then. Has taken OTC tylenol and ibuprofen with moderate relief.      Patient is in today for Back pain   States that it started over a week ago Saturday.  States that her sister is doing  remodled and was lifting heavy objects started huritng the next day States taht she has been taking ibuprfen and tylenol. Has been helping  Aching pain that is intermittent throughout  States that gettig up she can feel it. States that she will start to eas off after she starts walking   Hand numbness: stated over a year ago. No neck injruy has stayed the same. No weankess in the hands. Has not tried splening. States that she will have muscle spasms. Will wake her up at night. It is not a pain   Review of Systems  Constitutional:  Negative for chills and fever.  Genitourinary:        B&B negative    Musculoskeletal:  Positive for back pain.  Neurological:  Negative for tingling and weakness.        Objective:    BP (!) 128/58   Pulse 74   Temp 97.8 F (36.6 C) (Temporal)   Ht '5\' 6"'$  (1.676 m)   Wt 204 lb (92.5 kg)   SpO2 98%   BMI 32.93 kg/m    Physical Exam Vitals and nursing note reviewed.  Constitutional:      Appearance: Normal appearance.  Cardiovascular:     Rate and Rhythm: Normal rate and regular rhythm.     Heart sounds: Normal heart sounds.  Pulmonary:     Effort: Pulmonary effort is normal.     Breath sounds: Normal breath sounds.  Musculoskeletal:     Lumbar back: No tenderness or bony tenderness. Negative right straight leg raise test and negative left straight leg raise test.       Back:     Comments: Negative phalan and tinel sign on bilateral wrist. No appreciable thenar eminence wasting  Neurological:      General: No focal deficit present.     Mental Status: She is alert.     Deep Tendon Reflexes:     Reflex Scores:      Patellar reflexes are 2+ on the right side and 2+ on the left side.    Comments: Bilateral lower extremity strength 5/5     No results found for any visits on 03/29/22.      Assessment & Plan:   Problem List Items Addressed This Visit       Other   Bilateral hand numbness    Been going on for over a year.  No pain per patient report negative Phalen and Tennille sign.  Will do cervical spine x-ray pending results.  Can always refer to neurology for further workup      Relevant Orders   DG Cervical Spine Complete   Acute bilateral low back pain without sciatica - Primary    Secondary to improper body mechanics most likely.  No red flag signs on exam.  Flexeril 5 mg twice daily as needed sedation precautions reviewed.  If does not improve follow-up in office      Relevant Medications  cyclobenzaprine (FLEXERIL) 5 MG tablet   Muscle tightness    Flexeril 5 mg twice daily as needed.  Sedation precautions reviewed.      Relevant Medications   cyclobenzaprine (FLEXERIL) 5 MG tablet    Meds ordered this encounter  Medications   cyclobenzaprine (FLEXERIL) 5 MG tablet    Sig: Take 1 tablet (5 mg total) by mouth 2 (two) times daily as needed for muscle spasms.    Dispense:  15 tablet    Refill:  0    Order Specific Question:   Supervising Provider    Answer:   Loura Pardon A [[id number]]    Return if symptoms worsen or fail to improve, for As scheduled.  [name], NP

## 2022-03-29 NOTE — Assessment & Plan Note (Signed)
Secondary to improper body mechanics most likely.  No red flag signs on exam.  Flexeril 5 mg twice daily as needed sedation precautions reviewed.  If does not improve follow-up in office

## 2022-03-29 NOTE — Assessment & Plan Note (Signed)
Been going on for over a year.  No pain per patient report negative Phalen and Tennille sign.  Will do cervical spine x-ray pending results.  Can always refer to neurology for further workup

## 2022-03-29 NOTE — Assessment & Plan Note (Signed)
Flexeril 5 mg twice daily as needed.  Sedation precautions reviewed.

## 2022-03-29 NOTE — Patient Instructions (Addendum)
Nice to see you today I will be in touch with the xray once I have the results Follow up if no improvement, sooner if you need me Return as scheduled  I have sent in a muscle relaxer that may cause sedation, use caution

## 2022-04-06 ENCOUNTER — Telehealth: Payer: Self-pay | Admitting: Nurse Practitioner

## 2022-04-06 DIAGNOSIS — R200 Anesthesia of skin: Secondary | ICD-10-CM

## 2022-04-06 NOTE — Telephone Encounter (Signed)
Patient called in and stated that she would like for the referral to be sent in for the neurologist. She would like it to be somewhere in [place]. Thank you!

## 2022-04-07 NOTE — Addendum Note (Signed)
Addended by: Michela Pitcher on: 04/07/2022 01:31 PM   Modules accepted: Orders

## 2022-04-07 NOTE — Telephone Encounter (Signed)
Referral placed.

## 2022-04-21 ENCOUNTER — Telehealth: Payer: Self-pay | Admitting: Nurse Practitioner

## 2022-04-21 NOTE — Telephone Encounter (Signed)
Called pt and informed her that she had refills and just needs to call the pharmacy to get refilled.

## 2022-04-21 NOTE — Telephone Encounter (Signed)
  Encourage patient to contact the pharmacy for refills or they can request refills through [facility]  Did the patient contact the pharmacy: yes    LAST APPOINTMENT DATE: 03/29/22  NEXT APPOINTMENT DATE:  MEDICATION:valACYclovir (VALTREX) 500 MG tablet   Is the patient out of medication? no  If not, how much is left?a few left  Is this a 90 day supply: 60  PHARMACY: [facility] [place] [facility] [place] Phone: [phone]  Fax: [phone]      Let patient know to contact pharmacy at the end of the day to make sure medication is ready.  Please notify patient to allow 48-72 hours to process

## 2022-04-30 ENCOUNTER — Encounter: Payer: Self-pay | Admitting: Nurse Practitioner

## 2022-04-30 ENCOUNTER — Ambulatory Visit (INDEPENDENT_AMBULATORY_CARE_PROVIDER_SITE_OTHER): Payer: Medicaid Other | Admitting: Nurse Practitioner

## 2022-04-30 VITALS — BP 108/66 | HR 77 | Temp 98.4°F | Ht 66.0 in | Wt 204.4 lb

## 2022-04-30 DIAGNOSIS — I10 Essential (primary) hypertension: Secondary | ICD-10-CM

## 2022-04-30 DIAGNOSIS — E669 Obesity, unspecified: Secondary | ICD-10-CM

## 2022-04-30 DIAGNOSIS — R7303 Prediabetes: Secondary | ICD-10-CM | POA: Diagnosis not present

## 2022-04-30 DIAGNOSIS — Z8619 Personal history of other infectious and parasitic diseases: Secondary | ICD-10-CM | POA: Diagnosis not present

## 2022-04-30 DIAGNOSIS — Z0000 Encounter for general adult medical examination without abnormal findings: Secondary | ICD-10-CM

## 2022-04-30 DIAGNOSIS — Z87891 Personal history of nicotine dependence: Secondary | ICD-10-CM | POA: Diagnosis not present

## 2022-04-30 DIAGNOSIS — F320 Major depressive disorder, single episode, mild: Secondary | ICD-10-CM

## 2022-04-30 LAB — LIPID PANEL
Cholesterol: 166 mg/dL (ref 0–200)
HDL: 60.6 mg/dL (ref >39.00)
LDL Cholesterol: 89 mg/dL (ref 0–99)
NonHDL: 105.01
Total CHOL/HDL Ratio: 3
Triglycerides: 79 mg/dL (ref 0.0–149.0)
VLDL: 15.8 mg/dL (ref 0.0–40.0)

## 2022-04-30 LAB — URINALYSIS, MICROSCOPIC ONLY: RBC / HPF: NONE SEEN (ref 0–?)

## 2022-04-30 LAB — CBC
HCT: 37.7 % (ref 36.0–46.0)
Hemoglobin: 12.5 g/dL (ref 12.0–15.0)
MCHC: 33.3 g/dL (ref 30.0–36.0)
MCV: 88.5 fl (ref 78.0–100.0)
Platelets: 265 10*3/uL (ref 150.0–400.0)
RBC: 4.26 Mil/uL (ref 3.87–5.11)
RDW: 14.8 % (ref 11.5–15.5)
WBC: 4.6 10*3/uL (ref 4.0–10.5)

## 2022-04-30 LAB — HEMOGLOBIN A1C: Hgb A1c MFr Bld: 6 % (ref 4.6–6.5)

## 2022-04-30 LAB — COMPREHENSIVE METABOLIC PANEL
ALT: 11 U/L (ref 0–35)
AST: 11 U/L (ref 0–37)
Albumin: 3.9 g/dL (ref 3.5–5.2)
Alkaline Phosphatase: 37 U/L — ABNORMAL LOW (ref 39–117)
BUN: 11 mg/dL (ref 6–23)
CO2: 31 mEq/L (ref 19–32)
Calcium: 9 mg/dL (ref 8.4–10.5)
Chloride: 103 mEq/L (ref 96–112)
Creatinine, Ser: 0.66 mg/dL (ref 0.40–1.20)
GFR: 92.78 mL/min (ref >60.00)
Glucose, Bld: 77 mg/dL (ref 70–99)
Potassium: 4.1 mEq/L (ref 3.5–5.1)
Sodium: 140 mEq/L (ref 135–145)
Total Bilirubin: 0.4 mg/dL (ref 0.2–1.2)
Total Protein: 5.7 g/dL — ABNORMAL LOW (ref 6.0–8.3)

## 2022-04-30 LAB — TSH: TSH: 1.87 u[IU]/mL (ref 0.35–5.50)

## 2022-04-30 NOTE — Progress Notes (Signed)
Established Patient Office Visit  Subjective   Patient ID: Elaine Robinson, female    DOB: [birth year]  Age: 67 y.o. MRN: [id number]  Chief Complaint  Patient presents with   Annual Exam    HPI  HTN: Patient currently maintained on amlodipine  HLD: Patient currently maintained on simvastatin 10 mg  Depression: Patient currently maintained on 150 mg of sertraline. Doing well. Denies HI/SI/AVH  for complete physical and follow up of chronic conditions.  Immunizations: -Tetanus: Completed in unsure -Influenza: Refused -Shingles: refused -Pneumonia: Too young  -HPV: Aged out  Diet: [place]. States that some days 1 and some days 2. States that she snacks. States she will over eat on the one meal. Oj cranberry and water. Sometimes soda. Exercise: No regular exercise. Keeps great grandbabby and steps in the house  Eye exam: Completes annually. Wears glasses Dental exam: needs updating  Pap Smear: Completed in hysterectomy  Mammogram: Completed in 02/05/2022  Colonoscopy: Completed in 09/21/2019, repeat in 3 years. Due 08/2022 Lung Cancer Screening: Completed in 07/14/2021. Emphysema, aortic atherosclerosis, enlarged pulmonic trunk Dexa: too young  Sleep: States that she gets up around 5am and goes to bed around 10. Does feel rested. Told that she snores some      04/30/2022    8:21 AM 11/02/2021    9:04 AM 09/14/2021    4:01 PM  PHQ9 SCORE ONLY  PHQ-9 Total Score '5 9 6       '$ 04/30/2022    8:21 AM 11/02/2021    9:05 AM 09/14/2021    4:03 PM 08/05/2021   12:12 PM  GAD 7 : Generalized Anxiety Score  Nervous, Anxious, on Edge '1 1 1 1  '$ Control/stop worrying '1 1 1 3  '$ Worry too much - different things '1 1 1 3  '$ Trouble relaxing '1 1 1 2  '$ Restless 1 0 1 1  Easily annoyed or irritable '1 1 1 3  '$ Afraid - awful might happen '1 1 1 3  '$ Total GAD 7 Score '7 6 7 16  '$ Anxiety Difficulty  Somewhat difficult Somewhat difficult Very difficult        Review of Systems   Constitutional:  Negative for chills and fever.  Respiratory:  Negative for shortness of breath.   Cardiovascular:  Negative for chest pain and leg swelling.  Gastrointestinal:  Positive for constipation. Negative for abdominal pain, blood in stool, diarrhea and vomiting.       BM up to three days  Genitourinary:  Negative for dysuria and hematuria.  Neurological:  Negative for headaches.  Psychiatric/Behavioral:  Negative for hallucinations and suicidal ideas.       Objective:     BP 108/66   Pulse 77   Temp 98.4 F (36.9 C)   Ht '5\' 6"'$  (1.676 m)   Wt 204 lb 6.4 oz (92.7 kg)   SpO2 98%   BMI 32.99 kg/m  BP Readings from Last 3 Encounters:  04/30/22 108/66  03/29/22 (!) 128/58  12/29/21 100/62   Wt Readings from Last 3 Encounters:  04/30/22 204 lb 6.4 oz (92.7 kg)  03/29/22 204 lb (92.5 kg)  12/29/21 204 lb 12.8 oz (92.9 kg)      Physical Exam Vitals and nursing note reviewed.  Constitutional:      Appearance: Normal appearance.  HENT:     Right Ear: Tympanic membrane, ear canal and external ear normal.     Left Ear: Tympanic membrane, ear canal and external ear normal.  Mouth/Throat:     Mouth: Mucous membranes are moist.     Pharynx: Oropharynx is clear.  Eyes:     Extraocular Movements: Extraocular movements intact.     Pupils: Pupils are equal, round, and reactive to light.     Comments: Wear glasses   Cardiovascular:     Rate and Rhythm: Normal rate and regular rhythm.     Heart sounds: Normal heart sounds.  Pulmonary:     Effort: Pulmonary effort is normal.     Breath sounds: Normal breath sounds.  Abdominal:     General: Bowel sounds are normal. There is no distension.     Palpations: There is no mass.     Tenderness: There is no abdominal tenderness.     Hernia: No hernia is present.  Musculoskeletal:     Right lower leg: No edema.     Left lower leg: No edema.  Lymphadenopathy:     Cervical: No cervical adenopathy.  Skin:    General:  Skin is warm.  Neurological:     General: No focal deficit present.     Mental Status: She is alert.     Deep Tendon Reflexes:     Reflex Scores:      Bicep reflexes are 2+ on the right side and 2+ on the left side.      Patellar reflexes are 2+ on the right side and 2+ on the left side.    Comments: Bilateral upper and lower extremity strength 5/5  Psychiatric:        Mood and Affect: Mood normal.        Behavior: Behavior normal.        Thought Content: Thought content normal.        Judgment: Judgment normal.      No results found for any visits on 04/30/22.    The 10-year ASCVD risk score (Arnett DK, et al., 2019) is: 4.8%    Assessment & Plan:   Problem List Items Addressed This Visit       Cardiovascular and Mediastinum   Essential hypertension    Patient currently maintained on amlodipine.  Blood pressure within normal limits.  Continue medication as prescribed.      Relevant Orders   CBC   Comprehensive metabolic panel   Hemoglobin A1c   Lipid panel     Other   Preventative health care - Primary    Discussed age-appropriate immunizations and screening exams.  Recommendation get tetanus shot at local health department encouraged shingles vaccine patient politely refused.  Patient no longer gets cervical cancer screening due to hysterectomy.  Mammogram up-to-date, CRC screening up-to-date.  Patient was given information at dismissal in regards to preventative healthcare maintenance with anticipatory guidance for age range.      Relevant Orders   CBC   Comprehensive metabolic panel   Hemoglobin A1c   TSH   Lipid panel   Prediabetes    Pending labs.  Weight stable      Relevant Orders   Hemoglobin A1c   Lipid panel   History of herpes genitalis    Stable on suppression therapy.  Continue      Depression, major, single episode, mild (HCC)    Currently maintained on sertraline 150 mg daily.  Patient tolerates medication well.  PHQ-9 and GAD-7  administered in office.  Patient has HI/SI/AVH.      Obesity (BMI 30-39.9)    Work on healthy lifestyle modifications.      Relevant  Orders   Hemoglobin A1c   Lipid panel    Return in about 1 year (around 05/01/2023) for CPE and Labs.    [name], NP

## 2022-04-30 NOTE — Assessment & Plan Note (Signed)
Stable on suppression therapy.  Continue

## 2022-04-30 NOTE — Assessment & Plan Note (Signed)
Work on healthy lifestyle modifications.

## 2022-04-30 NOTE — Assessment & Plan Note (Signed)
Pending labs.  Weight stable

## 2022-04-30 NOTE — Addendum Note (Signed)
Addended by: Tammi Sou on: 04/30/2022 02:14 PM   Modules accepted: Orders

## 2022-04-30 NOTE — Addendum Note (Signed)
Addended by: Michela Pitcher on: 04/30/2022 01:43 PM   Modules accepted: Orders

## 2022-04-30 NOTE — Assessment & Plan Note (Signed)
Patient currently maintained on amlodipine.  Blood pressure within normal limits.  Continue medication as prescribed.

## 2022-04-30 NOTE — Assessment & Plan Note (Signed)
Discussed age-appropriate immunizations and screening exams.  Recommendation get tetanus shot at local health department encouraged shingles vaccine patient politely refused.  Patient no longer gets cervical cancer screening due to hysterectomy.  Mammogram up-to-date, CRC screening up-to-date.  Patient was given information at dismissal in regards to preventative healthcare maintenance with anticipatory guidance for age range.

## 2022-04-30 NOTE — Assessment & Plan Note (Signed)
Currently maintained on sertraline 150 mg daily.  Patient tolerates medication well.  PHQ-9 and GAD-7 administered in office.  Patient has HI/SI/AVH.

## 2022-04-30 NOTE — Patient Instructions (Signed)
Nice to see you today I will be in touch with the labs once I have them I recommend you update your tetanus vaccine at the local health department I want to see you in 1 year for your next physical, sooner if you need me

## 2022-05-11 ENCOUNTER — Telehealth: Payer: Self-pay

## 2022-05-11 NOTE — Telephone Encounter (Signed)
[place] Night - Client Nonclinical Telephone Record  [facility] Client [facility] Night - Client Client Site [place] Provider [name]- NP Contact Type Call Who Is Calling Patient / Member / Family / Caregiver Caller Name [name] Caller Phone Number [phone] Patient Name Elaine Robinson Patient DOB 21-Sep-1955 Call Type Message Only Information Provided Reason for Call Request for General Office Information Initial Comment Caller states she needs to update her number. Additional Comment Caller states the number she list his her new number. Disp. Time Disposition Final User 05/11/2022 7:44:37 AM General Information Provided Yes Tindall, Ashely Call Closed By: [name] Transaction Date/Time: 05/11/2022 7:42:21 AM (ET  Sending to lsc support.

## 2022-05-11 NOTE — Telephone Encounter (Signed)
Called patient and updated number.

## 2022-05-31 ENCOUNTER — Telehealth: Payer: Self-pay

## 2022-05-31 ENCOUNTER — Ambulatory Visit (INDEPENDENT_AMBULATORY_CARE_PROVIDER_SITE_OTHER): Payer: Medicaid Other | Admitting: Family Medicine

## 2022-05-31 ENCOUNTER — Encounter: Payer: Self-pay | Admitting: Family Medicine

## 2022-05-31 VITALS — BP 100/70 | HR 85 | Temp 97.8°F | Ht 66.0 in | Wt 201.5 lb

## 2022-05-31 DIAGNOSIS — R300 Dysuria: Secondary | ICD-10-CM

## 2022-05-31 DIAGNOSIS — R319 Hematuria, unspecified: Secondary | ICD-10-CM

## 2022-05-31 LAB — POC URINALSYSI DIPSTICK (AUTOMATED)
Bilirubin, UA: NEGATIVE
Glucose, UA: NEGATIVE
Ketones, UA: NEGATIVE
Leukocytes, UA: NEGATIVE
Nitrite, UA: NEGATIVE
Protein, UA: POSITIVE — AB
Spec Grav, UA: >=1.03 — AB (ref 1.010–1.025)
Urobilinogen, UA: 0.2 E.U./dL
pH, UA: 6 (ref 5.0–8.0)

## 2022-05-31 MED ORDER — SULFAMETHOXAZOLE-TRIMETHOPRIM 800-160 MG PO TABS: 1.0000 | ORAL_TABLET | Freq: Two times a day (BID) | ORAL | 0 refills | Status: DC

## 2022-05-31 NOTE — Telephone Encounter (Signed)
Per appt notes pt already scheduled to see Dr Lorelei Pont 05/31/22 at 2:40 pm. Sending note to Dr Lorelei Pont as Juluis Rainier.

## 2022-05-31 NOTE — Telephone Encounter (Signed)
[place] Night - Client TELEPHONE ADVICE RECORD [facility] Patient Name: Elaine Robinson Gender: Female DOB: Jan 09, 1956 Age: 67 Y 82 M 18 D Return Phone Number: [phone] (Primary) Address: City/ State/ Zip: [place]  [address] Client [place] Night - Client Client Site [place] Provider [name]- NP Contact Type Call Who Is Calling Patient / Member / Family / Caregiver Call Type Triage / Clinical Relationship To Patient Self Return Phone Number [phone] (Primary) Chief Complaint Urine, Blood In Reason for Call Symptomatic / Request for Health Information Initial Comment Caller states she needs and appointment. Caller states she has a UTI and blood in urine. Translation No Nurse Assessment Nurse: Selena Batten, RN, Amy Date/Time Eilene Ghazi Time): 05/31/2022 8:16:10 AM Confirm and document reason for call. If symptomatic, describe symptoms. ---Caller states she has discomfort with urination and blood in urine that began this AM. Does the patient have any new or worsening symptoms? ---Yes Will a triage be completed? ---Yes Related visit to physician within the last 2 weeks? ---No Does the PT have any chronic conditions? (i.e. diabetes, asthma, this includes High risk factors for pregnancy, etc.) ---Yes List chronic conditions. ---hypertension Is this a behavioral health or substance abuse call? ---No Guidelines Guideline Title Affirmed Question Affirmed Notes Nurse Date/Time (Eastern Time) Urine - Blood In Pain or burning with passing urine Selena Batten, RN, Amy 05/31/2022 8:17:06 AM Disp. Time Eilene Ghazi Time) Disposition Final User 05/31/2022 8:20:20 AM See PCP within 24 Hours Yes Selena Batten, RN, Amy Final Disposition 05/31/2022 8:20:20 AM See PCP within 24 Hours Yes Selena Batten, RN, Amy PLEASE NOTE: All timestamps contained within this report are represented as [place] Standard Time. CONFIDENTIALTY  NOTICE: This fax transmission is intended only for the addressee. It contains information that is legally privileged, confidential or otherwise protected from use or disclosure. If you are not the intended recipient, you are strictly prohibited from reviewing, disclosing, copying using or disseminating any of this information or taking any action in reliance on or regarding this information. If you have received this fax in error, please notify us immediately by telephone so that we can arrange for its return to [place]. Phone: [phone], Toll-Free: [phone], Fax: [phone] Page: 2 of 2 Call Id: [id number] Caller Disagree/Comply Comply Caller Understands Yes PreDisposition Call Doctor Care Advice Given Per Guideline * IF OFFICE WILL BE OPEN: You need to be examined within the next 24 hours. Call your doctor (or NP/PA) when the office opens and make an appointment. SEE PCP WITHIN 24 HOURS: CALL BACK IF: * You become worse CARE ADVICE given per Urine, Blood In (Adult) guideline. Referrals REFERRED TO PCP OFFIC

## 2022-05-31 NOTE — Progress Notes (Signed)
Elaine Robinson. Elaine Lague, MD, Elaine Robinson at [facility] [place], [address]  Phone: [phone]  FAX: [phone]  Elaine Robinson - 67 y.o. female  MRN [id number]  Date of Birth: 01-Sep-1955  Date: 05/31/2022  PCP: Michela Pitcher, NP  Referral: Michela Pitcher, NP  Chief Complaint  Patient presents with   Dysuria   Hematuria   Subjective:   Elaine Robinson is a 67 y.o. very pleasant female patient with Body mass index is 32.52 kg/m. who presents with the following:  She presents with a 1 day history of burning pain when she urinates.  She also feels as if she has to go more than she normally would.  She does have a longstanding history of having multiple UTIs.  Has some dysuria Urgency No back pain Some uncomfortable in hypogastric  Review of Systems is noted in the HPI, as appropriate  Objective:   BP 100/70   Pulse 85   Temp 97.8 F (36.6 C) (Temporal)   Ht '5\' 6"'$  (1.676 m)   Wt 201 lb 8 oz (91.4 kg)   SpO2 97%   BMI 32.52 kg/m   GEN: WDWN HEENT: Atraumatc, normocephalic. CV: RRR, No M/G/R PULM: CTA B, No wheezes, crackles, or rhonchi ABD: S, NT, ND, +BS, no rebound. No CVAT. No suprapubic tenderness.   Laboratory and Imaging Data: Results for orders placed or performed in visit on 05/31/22  POCT Urinalysis Dipstick (Automated)  Result Value Ref Range   Color, UA Yellow    Clarity, UA Hazy    Glucose, UA Negative Negative   Bilirubin, UA Negative    Ketones, UA Negative    Spec Grav, UA >=1.030 (A) 1.010 - 1.025   Blood, UA Large    pH, UA 6.0 5.0 - 8.0   Protein, UA Positive (A) Negative   Urobilinogen, UA 0.2 0.2 or 1.0 E.U./dL   Nitrite, UA Negative    Leukocytes, UA Negative Negative     Assessment and Plan:     ICD-10-CM   1. Dysuria  R30.0     2. Hematuria, unspecified type  R31.9 POCT Urinalysis Dipstick (Automated)    Urine Culture     Probable UTI with dysuria and hematuria.   Obtain culture and treat with antibiotics.  Medication Management during today's office visit: Meds ordered this encounter  Medications   sulfamethoxazole-trimethoprim (BACTRIM DS) 800-160 MG tablet    Sig: Take 1 tablet by mouth 2 (two) times daily.    Dispense:  14 tablet    Refill:  0   There are no discontinued medications.  Orders placed today for conditions managed today: Orders Placed This Encounter  Procedures   Urine Culture   POCT Urinalysis Dipstick (Automated)    Disposition: No follow-ups on file.  Dragon [employer] One speech-to-text software was used for transcription in this dictation.  Possible transcriptional errors can occur using Editor, commissioning.   Signed,  Maud Deed. Luby Seamans, MD   Outpatient Encounter Medications as of 05/31/2022  Medication Sig   amLODipine (NORVASC) 5 MG tablet Take 1 tablet (5 mg total) by mouth daily.   aspirin 81 MG chewable tablet CHEW ONE TABLET BY MOUTH EVERY DAY.   sertraline (ZOLOFT) 100 MG tablet Take 1.5 tablets (150 mg total) by mouth daily.   simvastatin (ZOCOR) 10 MG tablet Take 1 tablet by mouth in the evening   sulfamethoxazole-trimethoprim (BACTRIM DS) 800-160 MG tablet Take 1 tablet by mouth 2 (  two) times daily.   valACYclovir (VALTREX) 500 MG tablet Take 1 tablet (500 mg total) by mouth 2 (two) times daily.   No facility-administered encounter medications on file as of 05/31/2022.

## 2022-06-02 LAB — URINE CULTURE
MICRO NUMBER:: 14519131
SPECIMEN QUALITY:: ADEQUATE

## 2022-07-05 ENCOUNTER — Other Ambulatory Visit: Payer: Self-pay | Admitting: Nurse Practitioner

## 2022-07-05 ENCOUNTER — Telehealth: Payer: Self-pay | Admitting: *Deleted

## 2022-07-05 ENCOUNTER — Other Ambulatory Visit: Payer: Self-pay | Admitting: *Deleted

## 2022-07-05 DIAGNOSIS — Z1211 Encounter for screening for malignant neoplasm of colon: Secondary | ICD-10-CM

## 2022-07-05 DIAGNOSIS — Z86010 Personal history of colonic polyps: Secondary | ICD-10-CM

## 2022-07-05 DIAGNOSIS — Z8659 Personal history of other mental and behavioral disorders: Secondary | ICD-10-CM

## 2022-07-05 MED ORDER — NA SULFATE-K SULFATE-MG SULF 17.5-3.13-1.6 GM/177ML PO SOLN: 1.0000 | Freq: Once | ORAL | 0 refills | Status: AC

## 2022-07-05 NOTE — Telephone Encounter (Signed)
Gastroenterology Pre-Procedure Review  Request Date: 09/24/2022 (repeat recall colonoscopy) Requesting Physician: Dr. Vicente Males  PATIENT REVIEW QUESTIONS: The patient responded to the following health history questions as indicated:    1. Are you having any GI issues? no 2. Do you have a personal history of Polyps? yes (09/21/2019) 3. Do you have a family history of Colon Cancer or Polyps? no 4. Diabetes Mellitus? no 5. Joint replacements in the past 12 months?no 6. Major health problems in the past 3 months?no 7. Any artificial heart valves, MVP, or defibrillator?no    MEDICATIONS & ALLERGIES:    Patient reports the following regarding taking any anticoagulation/antiplatelet therapy:   Plavix, Coumadin, Eliquis, Xarelto, Lovenox, Pradaxa, Brilinta, or Effient? no Aspirin? yes (81 mg, sometines)  Patient confirms/reports the following medications:  Current Outpatient Medications  Medication Sig Dispense Refill   Na Sulfate-K Sulfate-Mg Sulf 17.5-3.13-1.6 GM/177ML SOLN Take 1 kit by mouth once for 1 dose. 354 mL 0   amLODipine (NORVASC) 5 MG tablet Take 1 tablet (5 mg total) by mouth daily. 90 tablet 1   aspirin 81 MG chewable tablet CHEW ONE TABLET BY MOUTH EVERY DAY. 90 tablet 2   sertraline (ZOLOFT) 100 MG tablet Take 1.5 tablets (150 mg total) by mouth daily. 45 tablet 5   simvastatin (ZOCOR) 10 MG tablet Take 1 tablet by mouth in the evening 90 tablet 1   sulfamethoxazole-trimethoprim (BACTRIM DS) 800-160 MG tablet Take 1 tablet by mouth 2 (two) times daily. 14 tablet 0   valACYclovir (VALTREX) 500 MG tablet Take 1 tablet (500 mg total) by mouth 2 (two) times daily. 60 tablet 2   No current facility-administered medications for this visit.    Patient confirms/reports the following allergies:  Allergies  Allergen Reactions   Lisinopril Cough    Orders Placed This Encounter  Procedures   Ambulatory referral to Gastroenterology    Referral Priority:   Routine    Referral Type:    Consultation    Referral Reason:   Specialty Services Required    Referred to Provider:   Jonathon Bellows, MD    Number of Visits Requested:   1    AUTHORIZATION INFORMATION Primary Insurance: 1D#: Group #:  Secondary Insurance: 1D#: Group #:  SCHEDULE INFORMATION: Date: 09/24/2022 Time: Location: [place]

## 2022-07-05 NOTE — Telephone Encounter (Deleted)
Gastroenterology Pre-Procedure Review  Request Date: *** Requesting Physician: Dr. Marland Kitchen  PATIENT REVIEW QUESTIONS: The patient responded to the following health history questions as indicated:    1. Are you having any GI issues? {Yes/No:19989} 2. Do you have a personal history of Polyps? {Yes/No:19989} 3. Do you have a family history of Colon Cancer or Polyps? {Yes/No:19989} 4. Diabetes Mellitus? {Yes/No:19989} 5. Joint replacements in the past 12 months?{Yes/No:19989} 6. Major health problems in the past 3 months?{Yes/No:19989} 7. Any artificial heart valves, MVP, or defibrillator?{Yes/No:19989}    MEDICATIONS & ALLERGIES:    Patient reports the following regarding taking any anticoagulation/antiplatelet therapy:   Plavix, Coumadin, Eliquis, Xarelto, Lovenox, Pradaxa, Brilinta, or Effient? {Yes/No:19989} Aspirin? {Yes/No:19989}  Patient confirms/reports the following medications:  Current Outpatient Medications  Medication Sig Dispense Refill   amLODipine (NORVASC) 5 MG tablet Take 1 tablet (5 mg total) by mouth daily. 90 tablet 1   aspirin 81 MG chewable tablet CHEW ONE TABLET BY MOUTH EVERY DAY. 90 tablet 2   sertraline (ZOLOFT) 100 MG tablet Take 1.5 tablets (150 mg total) by mouth daily. 45 tablet 5   simvastatin (ZOCOR) 10 MG tablet Take 1 tablet by mouth in the evening 90 tablet 1   sulfamethoxazole-trimethoprim (BACTRIM DS) 800-160 MG tablet Take 1 tablet by mouth 2 (two) times daily. 14 tablet 0   valACYclovir (VALTREX) 500 MG tablet Take 1 tablet (500 mg total) by mouth 2 (two) times daily. 60 tablet 2   No current facility-administered medications for this visit.    Patient confirms/reports the following allergies:  Allergies  Allergen Reactions   Lisinopril Cough    No orders of the defined types were placed in this encounter.   AUTHORIZATION INFORMATION Primary Insurance: 1D#: Group #:  Secondary Insurance: 1D#: Group #:  SCHEDULE INFORMATION: Date:   Time: Location:

## 2022-07-15 ENCOUNTER — Ambulatory Visit
Admission: RE | Admit: 2022-07-15 | Discharge: 2022-07-15 | Disposition: A | Discharge status: Home / Self Care | Payer: Medicaid Other | Source: Ambulatory Visit | Attending: Nurse Practitioner | Admitting: Nurse Practitioner

## 2022-07-15 DIAGNOSIS — I700 Atherosclerosis of aorta: Secondary | ICD-10-CM | POA: Insufficient documentation

## 2022-07-15 DIAGNOSIS — J439 Emphysema, unspecified: Secondary | ICD-10-CM | POA: Diagnosis not present

## 2022-07-15 DIAGNOSIS — Z87891 Personal history of nicotine dependence: Secondary | ICD-10-CM

## 2022-07-15 DIAGNOSIS — Z122 Encounter for screening for malignant neoplasm of respiratory organs: Secondary | ICD-10-CM | POA: Diagnosis not present

## 2022-07-19 ENCOUNTER — Other Ambulatory Visit: Payer: Self-pay | Admitting: Acute Care

## 2022-07-19 DIAGNOSIS — Z87891 Personal history of nicotine dependence: Secondary | ICD-10-CM

## 2022-07-19 DIAGNOSIS — Z122 Encounter for screening for malignant neoplasm of respiratory organs: Secondary | ICD-10-CM

## 2022-07-26 ENCOUNTER — Telehealth: Payer: Self-pay | Admitting: Nurse Practitioner

## 2022-07-26 ENCOUNTER — Other Ambulatory Visit: Payer: Self-pay

## 2022-07-26 DIAGNOSIS — Z8619 Personal history of other infectious and parasitic diseases: Secondary | ICD-10-CM

## 2022-07-26 MED ORDER — VALACYCLOVIR HCL 500 MG PO TABS: 500.0000 mg | ORAL_TABLET | Freq: Two times a day (BID) | ORAL | 2 refills | Status: DC

## 2022-07-26 NOTE — Telephone Encounter (Signed)
Prescription Request  07/26/2022  LOV: 04/30/2022  What is the name of the medication or equipment? valACYclovir (VALTREX) 500 MG tablet   Have you contacted your pharmacy to request a refill? No   Which pharmacy would you like this sent to?   [facility] [place] [address] [place] ([place]) [place] [address] Phone: [phone] Fax: [phone]    Patient notified that their request is being sent to the clinical staff for review and that they should receive a response within 2 business days.   Please advise at Mobile [phone] (mobile)

## 2022-07-26 NOTE — Telephone Encounter (Signed)
valACYclovir (VALTREX) 500 MG tablet  Last visit 04/30/2022 Next Follow up in 1 year (05/01/2023)

## 2022-07-26 NOTE — Telephone Encounter (Signed)
RX sent to provider

## 2022-08-26 ENCOUNTER — Telehealth: Payer: Self-pay

## 2022-08-26 NOTE — Telephone Encounter (Signed)
Patient has been contacted to r/s her colonoscopy from 09/24/22 due to schedule change for Dr. Tobi Bastos.  Patient has agreed to reschedule her procedure to 10/01/22. Dava in Endo has been informed of colonoscopy date change.  Thanks, [place], [place]

## 2022-09-02 ENCOUNTER — Other Ambulatory Visit: Payer: Self-pay | Admitting: Nurse Practitioner

## 2022-09-02 DIAGNOSIS — E785 Hyperlipidemia, unspecified: Secondary | ICD-10-CM

## 2022-09-02 DIAGNOSIS — I10 Essential (primary) hypertension: Secondary | ICD-10-CM

## 2022-09-24 ENCOUNTER — Encounter: Payer: Self-pay | Admitting: Gastroenterology

## 2022-10-01 ENCOUNTER — Ambulatory Visit: Payer: Medicaid Other | Admitting: Certified Registered"

## 2022-10-01 ENCOUNTER — Ambulatory Visit
Admission: RE | Admit: 2022-10-01 | Discharge: 2022-10-01 | Disposition: A | Discharge status: Home / Self Care | Payer: Medicaid Other | Attending: Gastroenterology | Admitting: Gastroenterology

## 2022-10-01 ENCOUNTER — Encounter: Payer: Self-pay | Admitting: Gastroenterology

## 2022-10-01 ENCOUNTER — Encounter
Admission: RE | Disposition: A | Discharge status: Home / Self Care | Payer: Self-pay | Source: Home / Self Care | Attending: Gastroenterology

## 2022-10-01 ENCOUNTER — Other Ambulatory Visit: Payer: Self-pay

## 2022-10-01 DIAGNOSIS — Z1211 Encounter for screening for malignant neoplasm of colon: Secondary | ICD-10-CM | POA: Insufficient documentation

## 2022-10-01 DIAGNOSIS — Z860100 Personal history of colon polyps, unspecified: Secondary | ICD-10-CM

## 2022-10-01 DIAGNOSIS — Z09 Encounter for follow-up examination after completed treatment for conditions other than malignant neoplasm: Secondary | ICD-10-CM | POA: Diagnosis not present

## 2022-10-01 DIAGNOSIS — I10 Essential (primary) hypertension: Secondary | ICD-10-CM | POA: Insufficient documentation

## 2022-10-01 DIAGNOSIS — F32A Depression, unspecified: Secondary | ICD-10-CM | POA: Diagnosis not present

## 2022-10-01 DIAGNOSIS — Z87891 Personal history of nicotine dependence: Secondary | ICD-10-CM | POA: Insufficient documentation

## 2022-10-01 HISTORY — PX: COLONOSCOPY WITH PROPOFOL: SHX5780

## 2022-10-01 SURGERY — COLONOSCOPY WITH PROPOFOL
Anesthesia: General

## 2022-10-01 MED ORDER — LIDOCAINE HCL (CARDIAC) PF 100 MG/5ML IV SOSY
PREFILLED_SYRINGE | INTRAVENOUS | Status: DC | PRN
  Administered 2022-10-01: 100 mg via INTRAVENOUS

## 2022-10-01 MED ORDER — SODIUM CHLORIDE 0.9 % IV SOLN: INTRAVENOUS | Status: DC

## 2022-10-01 MED ORDER — PROPOFOL 500 MG/50ML IV EMUL
INTRAVENOUS | Status: DC | PRN
  Administered 2022-10-01: 120 ug/kg/min via INTRAVENOUS
  Administered 2022-10-01: 50 mg via INTRAVENOUS
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
## 2022-10-01 NOTE — Anesthesia Postprocedure Evaluation (Signed)
Anesthesia Post Note  Patient: Elaine Robinson  Procedure(s) Performed: COLONOSCOPY WITH PROPOFOL  Patient location during evaluation: PACU Anesthesia Type: General Level of consciousness: awake and alert, oriented and patient cooperative Pain management: pain level controlled Vital Signs Assessment: post-procedure vital signs reviewed and stable Respiratory status: spontaneous breathing, nonlabored ventilation and respiratory function stable Cardiovascular status: blood pressure returned to baseline and stable Postop Assessment: adequate PO intake Anesthetic complications: no   There were no known notable events for this encounter.   Last Vitals:  Vitals:   10/01/22 0949 10/01/22 0959  BP: 110/72 116/76  Pulse: 69 68  Resp: 11 16  Temp: (!) 36 C   SpO2: 99% 100%    Last Pain:  Vitals:   10/01/22 0959  TempSrc:   PainSc: 0-No pain                 Reed Breech

## 2022-10-01 NOTE — Transfer of Care (Signed)
Immediate Anesthesia Transfer of Care Note  Patient: Elaine Robinson  Procedure(s) Performed: COLONOSCOPY WITH PROPOFOL  Patient Location: PACU  Anesthesia Type:General  Level of Consciousness: drowsy  Airway & Oxygen Therapy: Patient Spontanous Breathing  Post-op Assessment: Report given to RN and Post -op Vital signs reviewed and stable  Post vital signs: stable  Last Vitals:  Vitals Value Taken Time  BP 110/72 10/01/22 0949  Temp 36 C 10/01/22 0949  Pulse 67 10/01/22 0953  Resp 17 10/01/22 0953  SpO2 99 % 10/01/22 0953  Vitals shown include unvalidated device data.  Last Pain:  Vitals:   10/01/22 0949  TempSrc: Temporal  PainSc: Asleep         Complications: No notable events documented.

## 2022-10-01 NOTE — Op Note (Signed)
[facility] Gastroenterology Patient Name: Elaine Robinson Procedure Date: 10/01/2022 9:31 AM MRN: [id number] Account #: [id number] Date of Birth: 1955/10/28 Admit Type: Outpatient Age: 67 Room: [facility] ENDO ROOM 4 Gender: Female Note Status: Finalized Instrument Name: Prentice Docker [id number] Procedure:             Colonoscopy Indications:           Surveillance: Personal history of adenomatous polyps                         on last colonoscopy > 3 years ago, Last colonoscopy:                         May 2021 Providers:             Wyline Mood MD, MD Referring MD:          Wyline Mood MD, MD (Referring MD), Genene Churn. Toney Reil                         (Referring MD) Medicines:             Monitored Anesthesia Care Complications:         No immediate complications. Procedure:             Pre-Anesthesia Assessment:                        - Prior to the procedure, a History and Physical was                         performed, and patient medications, allergies and                         sensitivities were reviewed. The patient's tolerance                         of previous anesthesia was reviewed.                        - The risks and benefits of the procedure and the                         sedation options and risks were discussed with the                         patient. All questions were answered and informed                         consent was obtained.                        - ASA Grade Assessment: II - A patient with mild                         systemic disease.                        After obtaining informed consent, the colonoscope was                         passed under direct vision.  Throughout the procedure,                         the patient's blood pressure, pulse, and oxygen                         saturations were monitored continuously. The                         Colonoscope was introduced through the anus and                         advanced to the the  cecum, identified by the                         appendiceal orifice. The colonoscopy was performed                         with ease. The patient tolerated the procedure well.                         The quality of the bowel preparation was excellent.                         The ileocecal valve, appendiceal orifice, and rectum                         were photographed. Findings:      The perianal and digital rectal examinations were normal.      The entire examined colon appeared normal on direct and retroflexion       views. Impression:            - The entire examined colon is normal on direct and                         retroflexion views.                        - No specimens collected. Recommendation:        - Discharge patient to home (with escort).                        - Resume previous diet.                        - Continue present medications.                        - Repeat colonoscopy in 5 years for surveillance. Procedure Code(s):     --- Professional ---                        [phone], Colonoscopy, flexible; diagnostic, including                         collection of specimen(s) by brushing or washing, when                         performed (separate procedure) Diagnosis Code(s):     --- Professional ---  Z86.010, Personal history of colonic polyps CPT copyright 2022 American Medical Association. All rights reserved. The codes documented in this report are preliminary and upon coder review may  be revised to meet current compliance requirements. Wyline Mood, MD Wyline Mood MD, MD 10/01/2022 9:47:48 AM This report has been signed electronically. Number of Addenda: 0 Note Initiated On: 10/01/2022 9:31 AM Scope Withdrawal Time: 0 hours 7 minutes 53 seconds  Total Procedure Duration: 0 hours 10 minutes 24 seconds  Estimated Blood Loss:  Estimated blood loss: none.      [facility]

## 2022-10-01 NOTE — Anesthesia Preprocedure Evaluation (Addendum)
Anesthesia Evaluation  Patient identified by MRN, date of birth, ID band Patient awake    Reviewed: Allergy & Precautions, NPO status , Patient's Chart, lab work & pertinent test results  History of Anesthesia Complications Negative for: history of anesthetic complications  Airway Mallampati: III   Neck ROM: Full    Dental  (+) Upper Dentures   Pulmonary former smoker (quit 2021)   Pulmonary exam normal breath sounds clear to auscultation       Cardiovascular hypertension, Normal cardiovascular exam Rhythm:Regular Rate:Normal     Neuro/Psych  PSYCHIATRIC DISORDERS Anxiety Depression    negative neurological ROS     GI/Hepatic negative GI ROS,,,  Endo/Other  Obesity   Renal/GU negative Renal ROS     Musculoskeletal  (+) Arthritis ,    Abdominal   Peds  Hematology negative hematology ROS (+)   Anesthesia Other Findings   Reproductive/Obstetrics                             Anesthesia Physical Anesthesia Plan  ASA: 2  Anesthesia Plan: General   Post-op Pain Management:    Induction: Intravenous  PONV Risk Score and Plan: 3 and Propofol infusion, TIVA and Treatment may vary due to age or medical condition  Airway Management Planned: Natural Airway  Additional Equipment:   Intra-op Plan:   Post-operative Plan:   Informed Consent: I have reviewed the patients History and Physical, chart, labs and discussed the procedure including the risks, benefits and alternatives for the proposed anesthesia with the patient or authorized representative who has indicated his/her understanding and acceptance.       Plan Discussed with: CRNA  Anesthesia Plan Comments: (LMA/GETA backup discussed.  Patient consented for risks of anesthesia including but not limited to:  - adverse reactions to medications - damage to eyes, teeth, lips or other oral mucosa - nerve damage due to positioning  -  sore throat or hoarseness - damage to heart, brain, nerves, lungs, other parts of body or loss of life  Informed patient about role of CRNA in peri- and intra-operative care.  Patient voiced understanding.)        Anesthesia Quick Evaluation

## 2022-10-04 ENCOUNTER — Encounter: Payer: Self-pay | Admitting: Gastroenterology

## 2022-10-05 ENCOUNTER — Other Ambulatory Visit: Payer: Self-pay | Admitting: Nurse Practitioner

## 2022-10-05 DIAGNOSIS — Z8659 Personal history of other mental and behavioral disorders: Secondary | ICD-10-CM

## 2022-10-31 ENCOUNTER — Other Ambulatory Visit: Payer: Self-pay | Admitting: Nurse Practitioner

## 2022-10-31 DIAGNOSIS — Z8619 Personal history of other infectious and parasitic diseases: Secondary | ICD-10-CM

## 2022-11-15 ENCOUNTER — Ambulatory Visit: Payer: MEDICAID | Admitting: Nurse Practitioner

## 2022-11-15 ENCOUNTER — Encounter: Payer: Self-pay | Admitting: Nurse Practitioner

## 2022-11-15 VITALS — BP 110/80 | HR 80 | Temp 98.0°F | Ht 66.0 in | Wt 205.0 lb

## 2022-11-15 DIAGNOSIS — R051 Acute cough: Secondary | ICD-10-CM | POA: Diagnosis not present

## 2022-11-15 DIAGNOSIS — H6123 Impacted cerumen, bilateral: Secondary | ICD-10-CM

## 2022-11-15 DIAGNOSIS — R519 Headache, unspecified: Secondary | ICD-10-CM | POA: Diagnosis not present

## 2022-11-15 DIAGNOSIS — N644 Mastodynia: Secondary | ICD-10-CM

## 2022-11-15 LAB — HIGH SENSITIVITY CRP: CRP, High Sensitivity: 1.17 mg/L (ref 0.000–5.000)

## 2022-11-15 LAB — SEDIMENTATION RATE: Sed Rate: 11 mm/hr (ref 0–30)

## 2022-11-15 LAB — BASIC METABOLIC PANEL
BUN: 13 mg/dL (ref 6–23)
CO2: 27 mEq/L (ref 19–32)
Calcium: 8.8 mg/dL (ref 8.4–10.5)
Chloride: 108 mEq/L (ref 96–112)
Creatinine, Ser: 0.75 mg/dL (ref 0.40–1.20)
GFR: 83.89 mL/min (ref >60.00)
Glucose, Bld: 87 mg/dL (ref 70–99)
Potassium: 4.3 mEq/L (ref 3.5–5.1)
Sodium: 142 mEq/L (ref 135–145)

## 2022-11-15 LAB — CBC
HCT: 38.7 % (ref 36.0–46.0)
Hemoglobin: 12.4 g/dL (ref 12.0–15.0)
MCHC: 32.1 g/dL (ref 30.0–36.0)
MCV: 91 fl (ref 78.0–100.0)
Platelets: 249 10*3/uL (ref 150.0–400.0)
RBC: 4.25 Mil/uL (ref 3.87–5.11)
RDW: 14.6 % (ref 11.5–15.5)
WBC: 4 10*3/uL (ref 4.0–10.5)

## 2022-11-15 MED ORDER — FLUTICASONE PROPIONATE 50 MCG/ACT NA SUSP
2.0000 | Freq: Every day | NASAL | 0 refills | Status: AC
Start: 2022-11-15 — End: ?

## 2022-11-15 NOTE — Assessment & Plan Note (Signed)
History of the same.  History of breast biopsy.  Most recent mammogram was October 2023 that was a bilateral diagnostic that came back normal.  Patient deferred breast exam today.  This is intermittent in nature if it increases or breast changes happen she will let me know we will do mammogram prior to screening time

## 2022-11-15 NOTE — Patient Instructions (Addendum)
Nice to see you today I will be in touch with the labs once I have them Follow up with me in 5 months   Zepbound and Wegovy are the two injectable medications that are out and used for weight loss

## 2022-11-15 NOTE — Assessment & Plan Note (Signed)
Cough is intermittent in nature.  History of smoking did review low-dose CT scan.  Will start patient on Flonase nasal spray 2 sprays each nostril daily.  Epistaxis precautions reviewed with patient

## 2022-11-15 NOTE — Assessment & Plan Note (Signed)
Verbal consent was obtained.  Patient was prepped per office policy.  Cerumen softening eardrops were instilled.  A water and hydrogen peroxide mixture was used in bilateral ears were irrigated.  Impaction was removed bilaterally.  Patient tolerated procedure well.

## 2022-11-15 NOTE — Assessment & Plan Note (Signed)
Sounds more nerve related versus a true headache.  Will check ESR and sed rate nonpalpable temporal artery.  No visual changes.  Pending labs

## 2022-11-15 NOTE — Progress Notes (Signed)
Acute Office Visit  Subjective:     Patient ID: Elaine Robinson, female    DOB: 11-02-1955, 67 y.o.   MRN: [id number]  Chief Complaint  Patient presents with   Headache    Pt complains of really sharp pain in head. Comes and goes. Started 2 months ago.    Breast Pain    Pt complains of sharp pain in both breasts. Started 2-3 months ago.    Cough    Pt complains of bad cough that has throat sore for a while. Started a year ago.     HPI Patient is in today for multiple complaints with a history of HTN, Smoking, prediabetes, breast biopsy  Headache:States that it has been intermittent in nature and has been going on for 2 months. Right side fo the head and tmeple. States that it is weekly to a couple times a week. States that it is a sharp pain that is a few minutes in leght. No nv, light or sound sensitibty. Has not taken any otc treatment   Breast pain : hx of the same. Hx of breast biopsy. Had a diagnostic mammogram on 05/08/2021 that was normal. States that it started 2-3 months ago. States that it is bilateral. States that it is intermittent. States she has it today and may not have it for a couple week States no change in sze or skin. States that nipples are normal. No hix    Cough: hx of smoking. Underwent low dose CT scan on 07/15/2022 that did show emphysema and aortic atherosclerosis. States that it is not every day. States not very frequent. States that she will get a scratch in her throat and cough cough couhgl States that she cannot stop and her nose has been runnign   Review of Systems  Constitutional:  Negative for chills and fever.  Respiratory:  Positive for cough. Negative for shortness of breath.   Cardiovascular:  Negative for chest pain.  Neurological:  Positive for headaches. Negative for dizziness, tingling and weakness.        Objective:    BP 110/80   Pulse 80   Temp 98 F (36.7 C) (Temporal)   Ht 5\' 6"  (1.676 m)   Wt 205 lb (93 kg)   SpO2 98%   BMI  33.09 kg/m    Physical Exam Vitals and nursing note reviewed.  Constitutional:      Appearance: Normal appearance.  HENT:     Right Ear: Ear canal and external ear normal. There is impacted cerumen.     Left Ear: Ear canal and external ear normal. There is impacted cerumen.     Mouth/Throat:     Mouth: Mucous membranes are moist.     Pharynx: Oropharynx is clear.  Eyes:     Pupils: Pupils are equal, round, and reactive to light.  Cardiovascular:     Rate and Rhythm: Normal rate and regular rhythm.     Heart sounds: Normal heart sounds.  Pulmonary:     Effort: Pulmonary effort is normal.     Breath sounds: Normal breath sounds.  Chest:     Comments: Patient declined breast exam Musculoskeletal:     Right lower leg: No edema.     Left lower leg: No edema.  Lymphadenopathy:     Cervical: No cervical adenopathy.  Neurological:     General: No focal deficit present.     Mental Status: She is alert.     Deep Tendon Reflexes:  Reflex Scores:      Bicep reflexes are 2+ on the right side and 2+ on the left side.      Patellar reflexes are 2+ on the right side and 2+ on the left side.    Comments: Bilateral upper and lower extremity strength 5/5     No results found for any visits on 11/15/22.      Assessment & Plan:   Problem List Items Addressed This Visit       Nervous and Auditory   Bilateral impacted cerumen    Verbal consent was obtained.  Patient was prepped per office policy.  Cerumen softening eardrops were instilled.  A water and hydrogen peroxide mixture was used in bilateral ears were irrigated.  Impaction was removed bilaterally.  Patient tolerated procedure well.      Relevant Orders   Ear Lavage     Other   Breast pain    History of the same.  History of breast biopsy.  Most recent mammogram was October 2023 that was a bilateral diagnostic that came back normal.  Patient deferred breast exam today.  This is intermittent in nature if it increases or  breast changes happen she will let me know we will do mammogram prior to screening time      Acute cough    Cough is intermittent in nature.  History of smoking did review low-dose CT scan.  Will start patient on Flonase nasal spray 2 sprays each nostril daily.  Epistaxis precautions reviewed with patient      Relevant Medications   fluticasone (FLONASE) 50 MCG/ACT nasal spray   Acute nonintractable headache - Primary    Sounds more nerve related versus a true headache.  Will check ESR and sed rate nonpalpable temporal artery.  No visual changes.  Pending labs      Relevant Medications   gabapentin (NEURONTIN) 100 MG capsule   Other Relevant Orders   High sensitivity CRP   Sedimentation rate   CBC   Basic metabolic panel    Meds ordered this encounter  Medications   fluticasone (FLONASE) 50 MCG/ACT nasal spray    Sig: Place 2 sprays into both nostrils daily.    Dispense:  16 g    Refill:  0    Order Specific Question:   Supervising Provider    Answer:   Roxy Manns A [[id number]]    Return in about 6 months (around 05/18/2023) for CPE and Labs.  Audria Nine, NP

## 2023-01-03 ENCOUNTER — Other Ambulatory Visit: Payer: Self-pay | Admitting: Nurse Practitioner

## 2023-01-03 DIAGNOSIS — Z1231 Encounter for screening mammogram for malignant neoplasm of breast: Secondary | ICD-10-CM

## 2023-02-04 ENCOUNTER — Other Ambulatory Visit: Payer: Self-pay | Admitting: Nurse Practitioner

## 2023-02-04 DIAGNOSIS — Z8619 Personal history of other infectious and parasitic diseases: Secondary | ICD-10-CM

## 2023-02-07 ENCOUNTER — Ambulatory Visit
Admission: RE | Admit: 2023-02-07 | Discharge: 2023-02-07 | Disposition: A | Discharge status: Home / Self Care | Payer: Medicare HMO | Source: Ambulatory Visit | Attending: Nurse Practitioner | Admitting: Nurse Practitioner

## 2023-02-07 DIAGNOSIS — Z1231 Encounter for screening mammogram for malignant neoplasm of breast: Secondary | ICD-10-CM | POA: Insufficient documentation

## 2023-03-02 ENCOUNTER — Other Ambulatory Visit: Payer: Self-pay | Admitting: Nurse Practitioner

## 2023-03-02 DIAGNOSIS — I10 Essential (primary) hypertension: Secondary | ICD-10-CM

## 2023-03-10 ENCOUNTER — Other Ambulatory Visit: Payer: Self-pay | Admitting: Nurse Practitioner

## 2023-03-10 DIAGNOSIS — Z8619 Personal history of other infectious and parasitic diseases: Secondary | ICD-10-CM

## 2023-03-16 ENCOUNTER — Other Ambulatory Visit: Payer: Self-pay | Admitting: Nurse Practitioner

## 2023-03-16 DIAGNOSIS — E785 Hyperlipidemia, unspecified: Secondary | ICD-10-CM

## 2023-04-01 ENCOUNTER — Other Ambulatory Visit: Payer: Self-pay | Admitting: Nurse Practitioner

## 2023-04-01 DIAGNOSIS — Z8659 Personal history of other mental and behavioral disorders: Secondary | ICD-10-CM

## 2023-04-08 ENCOUNTER — Other Ambulatory Visit: Payer: Self-pay | Admitting: Nurse Practitioner

## 2023-04-08 DIAGNOSIS — Z8619 Personal history of other infectious and parasitic diseases: Secondary | ICD-10-CM

## 2023-05-12 ENCOUNTER — Other Ambulatory Visit: Payer: Self-pay | Admitting: Nurse Practitioner

## 2023-05-12 DIAGNOSIS — Z8619 Personal history of other infectious and parasitic diseases: Secondary | ICD-10-CM

## 2023-05-13 DIAGNOSIS — G5602 Carpal tunnel syndrome, left upper limb: Secondary | ICD-10-CM | POA: Diagnosis not present

## 2023-05-13 DIAGNOSIS — G5603 Carpal tunnel syndrome, bilateral upper limbs: Secondary | ICD-10-CM | POA: Diagnosis not present

## 2023-05-19 DIAGNOSIS — G5603 Carpal tunnel syndrome, bilateral upper limbs: Secondary | ICD-10-CM | POA: Diagnosis not present

## 2023-05-27 ENCOUNTER — Other Ambulatory Visit: Payer: Self-pay | Admitting: Nurse Practitioner

## 2023-05-27 DIAGNOSIS — I10 Essential (primary) hypertension: Secondary | ICD-10-CM

## 2023-05-27 NOTE — Telephone Encounter (Signed)
Can we schedule patient per my last office note please

## 2023-05-27 NOTE — Telephone Encounter (Signed)
Do you want [place] to remove you as PCP now?

## 2023-05-27 NOTE — Telephone Encounter (Signed)
Called pt and pt stated that she has a new doctor that she hasn't seen yet but has a appt coming up soon .

## 2023-06-10 ENCOUNTER — Other Ambulatory Visit: Payer: Self-pay | Admitting: Nurse Practitioner

## 2023-06-10 DIAGNOSIS — G5602 Carpal tunnel syndrome, left upper limb: Secondary | ICD-10-CM | POA: Diagnosis not present

## 2023-06-10 DIAGNOSIS — M545 Low back pain, unspecified: Secondary | ICD-10-CM | POA: Diagnosis not present

## 2023-06-10 DIAGNOSIS — R202 Paresthesia of skin: Secondary | ICD-10-CM | POA: Diagnosis not present

## 2023-06-10 DIAGNOSIS — Z8619 Personal history of other infectious and parasitic diseases: Secondary | ICD-10-CM

## 2023-06-10 DIAGNOSIS — R200 Anesthesia of skin: Secondary | ICD-10-CM | POA: Diagnosis not present

## 2023-06-10 DIAGNOSIS — G8929 Other chronic pain: Secondary | ICD-10-CM | POA: Diagnosis not present

## 2023-06-13 ENCOUNTER — Other Ambulatory Visit: Payer: Self-pay | Admitting: Nurse Practitioner

## 2023-06-13 DIAGNOSIS — Z8619 Personal history of other infectious and parasitic diseases: Secondary | ICD-10-CM

## 2023-06-13 NOTE — Telephone Encounter (Signed)
 Can we schedule patient for a CPE in the next 30 days plese

## 2023-06-13 NOTE — Telephone Encounter (Signed)
 Spoke to pt, pt states she is in search of a pcp closer to her. Declined scheduling appt.

## 2023-06-24 DIAGNOSIS — F32A Depression, unspecified: Secondary | ICD-10-CM | POA: Diagnosis not present

## 2023-06-24 DIAGNOSIS — G5603 Carpal tunnel syndrome, bilateral upper limbs: Secondary | ICD-10-CM | POA: Diagnosis not present

## 2023-06-24 DIAGNOSIS — E7800 Pure hypercholesterolemia, unspecified: Secondary | ICD-10-CM | POA: Diagnosis not present

## 2023-06-24 DIAGNOSIS — R7309 Other abnormal glucose: Secondary | ICD-10-CM | POA: Diagnosis not present

## 2023-06-24 DIAGNOSIS — Z7689 Persons encountering health services in other specified circumstances: Secondary | ICD-10-CM | POA: Diagnosis not present

## 2023-06-24 DIAGNOSIS — I10 Essential (primary) hypertension: Secondary | ICD-10-CM | POA: Diagnosis not present

## 2023-06-24 DIAGNOSIS — L723 Sebaceous cyst: Secondary | ICD-10-CM | POA: Diagnosis not present

## 2023-06-24 DIAGNOSIS — A6000 Herpesviral infection of urogenital system, unspecified: Secondary | ICD-10-CM | POA: Diagnosis not present

## 2023-07-12 ENCOUNTER — Other Ambulatory Visit: Payer: Self-pay | Admitting: Nurse Practitioner

## 2023-07-12 DIAGNOSIS — Z8619 Personal history of other infectious and parasitic diseases: Secondary | ICD-10-CM

## 2023-07-13 NOTE — Telephone Encounter (Signed)
 Looks like in February you requested a cpe appointment for further refills. Looks like patient declined wanted to find PCP near her.

## 2023-07-19 ENCOUNTER — Ambulatory Visit
Admission: RE | Admit: 2023-07-19 | Discharge: 2023-07-19 | Disposition: A | Discharge status: Home / Self Care | Payer: Medicare HMO | Source: Ambulatory Visit | Attending: Nurse Practitioner | Admitting: Nurse Practitioner

## 2023-07-19 DIAGNOSIS — Z122 Encounter for screening for malignant neoplasm of respiratory organs: Secondary | ICD-10-CM | POA: Diagnosis not present

## 2023-07-19 DIAGNOSIS — Z87891 Personal history of nicotine dependence: Secondary | ICD-10-CM | POA: Diagnosis not present

## 2023-07-19 DIAGNOSIS — F17210 Nicotine dependence, cigarettes, uncomplicated: Secondary | ICD-10-CM | POA: Diagnosis not present

## 2023-07-29 DIAGNOSIS — F32A Depression, unspecified: Secondary | ICD-10-CM | POA: Diagnosis not present

## 2023-07-29 DIAGNOSIS — E785 Hyperlipidemia, unspecified: Secondary | ICD-10-CM | POA: Diagnosis not present

## 2023-07-29 DIAGNOSIS — I10 Essential (primary) hypertension: Secondary | ICD-10-CM | POA: Diagnosis not present

## 2023-07-29 DIAGNOSIS — H579 Unspecified disorder of eye and adnexa: Secondary | ICD-10-CM | POA: Diagnosis not present

## 2023-08-19 DIAGNOSIS — M545 Low back pain, unspecified: Secondary | ICD-10-CM | POA: Diagnosis not present

## 2023-08-19 DIAGNOSIS — Z0100 Encounter for examination of eyes and vision without abnormal findings: Secondary | ICD-10-CM | POA: Diagnosis not present

## 2023-08-19 DIAGNOSIS — R202 Paresthesia of skin: Secondary | ICD-10-CM | POA: Diagnosis not present

## 2023-08-19 DIAGNOSIS — R252 Cramp and spasm: Secondary | ICD-10-CM | POA: Diagnosis not present

## 2023-08-19 DIAGNOSIS — H2513 Age-related nuclear cataract, bilateral: Secondary | ICD-10-CM | POA: Diagnosis not present

## 2023-08-19 DIAGNOSIS — G5602 Carpal tunnel syndrome, left upper limb: Secondary | ICD-10-CM | POA: Diagnosis not present

## 2023-08-19 DIAGNOSIS — G8929 Other chronic pain: Secondary | ICD-10-CM | POA: Diagnosis not present

## 2023-08-19 DIAGNOSIS — R200 Anesthesia of skin: Secondary | ICD-10-CM | POA: Diagnosis not present

## 2023-08-31 ENCOUNTER — Other Ambulatory Visit: Payer: Self-pay | Admitting: Acute Care

## 2023-08-31 DIAGNOSIS — Z122 Encounter for screening for malignant neoplasm of respiratory organs: Secondary | ICD-10-CM

## 2023-08-31 DIAGNOSIS — F17210 Nicotine dependence, cigarettes, uncomplicated: Secondary | ICD-10-CM

## 2023-08-31 DIAGNOSIS — Z87891 Personal history of nicotine dependence: Secondary | ICD-10-CM

## 2023-09-05 DIAGNOSIS — M25561 Pain in right knee: Secondary | ICD-10-CM | POA: Diagnosis not present

## 2023-09-05 DIAGNOSIS — M25562 Pain in left knee: Secondary | ICD-10-CM | POA: Diagnosis not present

## 2023-10-10 ENCOUNTER — Emergency Department
Admission: EM | Admit: 2023-10-10 | Discharge: 2023-10-10 | Disposition: A | Discharge status: Home / Self Care | Attending: Emergency Medicine | Admitting: Emergency Medicine

## 2023-10-10 ENCOUNTER — Other Ambulatory Visit: Payer: Self-pay

## 2023-10-10 DIAGNOSIS — L0291 Cutaneous abscess, unspecified: Secondary | ICD-10-CM

## 2023-10-10 DIAGNOSIS — L02211 Cutaneous abscess of abdominal wall: Secondary | ICD-10-CM | POA: Diagnosis not present

## 2023-10-10 MED ORDER — SULFAMETHOXAZOLE-TRIMETHOPRIM 800-160 MG PO TABS: 1.0000 | ORAL_TABLET | Freq: Two times a day (BID) | ORAL | 0 refills | Status: AC

## 2023-10-10 MED ORDER — TRAMADOL HCL 50 MG PO TABS: 50.0000 mg | ORAL_TABLET | Freq: Four times a day (QID) | ORAL | 0 refills | Status: AC | PRN

## 2023-10-10 MED ORDER — LIDOCAINE HCL (PF) 1 % IJ SOLN
5.0000 mL | Freq: Once | INTRAMUSCULAR | Status: AC
  Filled 2023-10-10: qty 5

## 2023-10-10 MED ORDER — LIDOCAINE HCL (PF) 1 % IJ SOLN
5.0000 mL | Freq: Once | INTRAMUSCULAR | Status: AC
  Administered 2023-10-10: 5 mL via INTRADERMAL
  Filled 2023-10-10: qty 5

## 2023-10-10 NOTE — ED Notes (Signed)
 See triage note  Presents with possible abscess area under right breast  States she noticed this about 3-4 weeks ago

## 2023-10-10 NOTE — Discharge Instructions (Signed)
 Please remove the packing in 2 to 3 days if there is no more drainage.  If you are unable to remove it you may see your primary care provider or return to the emergency department.  Take the antibiotic as prescribed until finished.  If you take the tramadol, be aware that it may make you sleepy or drowsy and at higher risk for fall or injury.  You may take Tylenol  instead.  Return to the emergency department for symptoms that change or worsen if you are unable to schedule an appointment.

## 2023-10-10 NOTE — ED Triage Notes (Signed)
 Pt comes in from home via pov with complaints of an abscess under her right breast for the past 4 weeks. Pt complains of pain 4/10.

## 2023-10-10 NOTE — ED Provider Notes (Signed)
 [facility] Provider Note    Event Date/Time   First MD Initiated Contact with Patient 10/10/23 1143     (approximate)   History   Abscess   HPI  Elaine Robinson is a 68 y.o. female with history of hypertension, hyperlipidemia and as listed in EMR presents to the emergency department for evaluation of abscess to the right abdomen.  It has been getting bigger over the past 4 weeks.  It started out like a blackhead and has just gotten bigger.  Area is tender and swollen.      Physical Exam   Triage Vital Signs: ED Triage Vitals  Encounter Vitals Group     BP 10/10/23 1129 128/84     Girls Systolic BP Percentile --      Girls Diastolic BP Percentile --      Boys Systolic BP Percentile --      Boys Diastolic BP Percentile --      Pulse Rate 10/10/23 1129 83     Resp 10/10/23 1129 17     Temp 10/10/23 1129 97.9 F (36.6 C)     Temp src --      SpO2 10/10/23 1129 95 %     Weight 10/10/23 1130 185 lb (83.9 kg)     Height 10/10/23 1130 5' 5 (1.651 m)     Head Circumference --      Peak Flow --      Pain Score 10/10/23 1130 4     Pain Loc --      Pain Education --      Exclude from Growth Chart --     Most recent vital signs: Vitals:   10/10/23 1129  BP: 128/84  Pulse: 83  Resp: 17  Temp: 97.9 F (36.6 C)  SpO2: 95%    General: Awake, no distress.  CV:  Good peripheral perfusion.  Resp:  Normal effort.  Abd:  No distention.  Other:  Tender, erythematous, fluctuant area on abdomen about 2x1cm with mild surrounding erythema.   ED Results / Procedures / Treatments   Labs (all labs ordered are listed, but only abnormal results are displayed) Labs Reviewed - No data to display   EKG  Not indicated.   RADIOLOGY  Image and radiology report reviewed and interpreted by me. Radiology report consistent with the same.  Not indicated.  PROCEDURES:  Critical Care performed: No  .Incision and Drainage  Date/Time: 10/10/2023  6:55 PM  Performed by: Herlinda Kirk NOVAK, FNP Authorized by: Herlinda Kirk NOVAK, FNP   Consent:    Consent obtained:  Verbal   Consent given by:  Patient   Risks discussed:  Bleeding and incomplete drainage Universal protocol:    Patient identity confirmed:  Verbally with patient Location:    Type:  Abscess   Location:  Trunk   Trunk location:  Abdomen Pre-procedure details:    Skin preparation:  Povidone-iodine Anesthesia:    Anesthesia method:  Local infiltration   Local anesthetic:  Lidocaine  1% w/o epi Procedure type:    Complexity:  Complex Procedure details:    Incision types:  Single straight   Wound management:  Probed and deloculated   Drainage:  Bloody and purulent   Drainage amount:  Moderate   Wound treatment:  Drain placed   Packing materials:  1/4 in iodoform gauze Post-procedure details:    Procedure completion:  Tolerated    MEDICATIONS ORDERED IN ED:  Medications - No data to display   IMPRESSION /  MDM / ASSESSMENT AND PLAN / ED COURSE   I have reviewed the triage note.  Differential diagnosis includes, but is not limited to, cellulitis, abscess  Patient's presentation is most consistent with acute illness / injury with system symptoms.  68 year old female presenting to the emergency department for treatment and evaluation of abscess.  See HPI for further details.  Vital signs are stable.  No indication of systemic infection.  Incision and drainage performed as above.   She was encouraged to remove the packing in 2 to 3 days or follow-up with her primary care provider.  She was encouraged to return to the emergency department for symptoms of change or worsen if she is unable to schedule an appointment.  Prescription for Bactrim  and tramadol was submitted to her pharmacy.  Medication teaching provided.  PA student, [place] assisted with procedure.      FINAL CLINICAL IMPRESSION(S) / ED DIAGNOSES   Final diagnoses:  None     Rx / DC Orders    ED Discharge Orders     None        Note:  This document was prepared using Dragon voice recognition software and may include unintentional dictation errors.   Herlinda Kirk NOVAK, FNP 10/10/23 JACKEY Arlander [place], MD 10/14/23 [phone]

## 2024-01-02 ENCOUNTER — Other Ambulatory Visit: Payer: Self-pay | Admitting: Family Medicine

## 2024-01-02 DIAGNOSIS — Z1231 Encounter for screening mammogram for malignant neoplasm of breast: Secondary | ICD-10-CM

## 2024-01-31 DIAGNOSIS — Z23 Encounter for immunization: Secondary | ICD-10-CM | POA: Diagnosis not present

## 2024-01-31 DIAGNOSIS — F32A Depression, unspecified: Secondary | ICD-10-CM | POA: Diagnosis not present

## 2024-01-31 DIAGNOSIS — Z0000 Encounter for general adult medical examination without abnormal findings: Secondary | ICD-10-CM | POA: Diagnosis not present

## 2024-01-31 DIAGNOSIS — E785 Hyperlipidemia, unspecified: Secondary | ICD-10-CM | POA: Diagnosis not present

## 2024-01-31 DIAGNOSIS — Z720 Tobacco use: Secondary | ICD-10-CM | POA: Diagnosis not present

## 2024-01-31 DIAGNOSIS — J029 Acute pharyngitis, unspecified: Secondary | ICD-10-CM | POA: Diagnosis not present

## 2024-01-31 DIAGNOSIS — I10 Essential (primary) hypertension: Secondary | ICD-10-CM | POA: Diagnosis not present

## 2024-01-31 DIAGNOSIS — G5600 Carpal tunnel syndrome, unspecified upper limb: Secondary | ICD-10-CM | POA: Diagnosis not present

## 2024-01-31 DIAGNOSIS — Z1331 Encounter for screening for depression: Secondary | ICD-10-CM | POA: Diagnosis not present

## 2024-02-08 ENCOUNTER — Ambulatory Visit
Admission: RE | Admit: 2024-02-08 | Discharge: 2024-02-08 | Disposition: A | Discharge status: Home / Self Care | Source: Ambulatory Visit | Attending: Family Medicine | Admitting: Family Medicine

## 2024-02-08 DIAGNOSIS — Z1231 Encounter for screening mammogram for malignant neoplasm of breast: Secondary | ICD-10-CM | POA: Diagnosis not present

## 2024-02-16 DIAGNOSIS — G8929 Other chronic pain: Secondary | ICD-10-CM | POA: Diagnosis not present

## 2024-02-16 DIAGNOSIS — M545 Low back pain, unspecified: Secondary | ICD-10-CM | POA: Diagnosis not present

## 2024-02-16 DIAGNOSIS — G5603 Carpal tunnel syndrome, bilateral upper limbs: Secondary | ICD-10-CM | POA: Diagnosis not present

## 2024-02-16 DIAGNOSIS — R202 Paresthesia of skin: Secondary | ICD-10-CM | POA: Diagnosis not present

## 2024-02-16 DIAGNOSIS — R252 Cramp and spasm: Secondary | ICD-10-CM | POA: Diagnosis not present

## 2024-02-16 DIAGNOSIS — G5602 Carpal tunnel syndrome, left upper limb: Secondary | ICD-10-CM | POA: Diagnosis not present

## 2024-02-16 DIAGNOSIS — R200 Anesthesia of skin: Secondary | ICD-10-CM | POA: Diagnosis not present

## 2024-02-22 DIAGNOSIS — J340 Abscess, furuncle and carbuncle of nose: Secondary | ICD-10-CM | POA: Diagnosis not present

## 2024-02-22 DIAGNOSIS — R09A2 Foreign body sensation, throat: Secondary | ICD-10-CM | POA: Diagnosis not present

## 2024-02-22 DIAGNOSIS — J301 Allergic rhinitis due to pollen: Secondary | ICD-10-CM | POA: Diagnosis not present

## 2024-02-22 DIAGNOSIS — K219 Gastro-esophageal reflux disease without esophagitis: Secondary | ICD-10-CM | POA: Diagnosis not present

## 2024-04-05 DIAGNOSIS — K219 Gastro-esophageal reflux disease without esophagitis: Secondary | ICD-10-CM | POA: Diagnosis not present

## 2024-04-05 DIAGNOSIS — J301 Allergic rhinitis due to pollen: Secondary | ICD-10-CM | POA: Diagnosis not present

## 2024-04-05 DIAGNOSIS — R070 Pain in throat: Secondary | ICD-10-CM | POA: Diagnosis not present

## 2024-07-19 ENCOUNTER — Ambulatory Visit
